# Patient Record
Sex: Female | Born: 1975 | Race: White | Hispanic: No | Marital: Married | Smoking: Former smoker
Health system: Southern US, Community
[De-identification: ages and names within clinical notes are randomized; demographics above are authoritative.]

## PROBLEM LIST (undated history)

## (undated) DIAGNOSIS — R0609 Other forms of dyspnea: Secondary | ICD-10-CM

## (undated) DIAGNOSIS — R079 Chest pain, unspecified: Secondary | ICD-10-CM

## (undated) DIAGNOSIS — Z8619 Personal history of other infectious and parasitic diseases: Secondary | ICD-10-CM

## (undated) DIAGNOSIS — J189 Pneumonia, unspecified organism: Secondary | ICD-10-CM

## (undated) DIAGNOSIS — F329 Major depressive disorder, single episode, unspecified: Secondary | ICD-10-CM

## (undated) DIAGNOSIS — K5909 Other constipation: Secondary | ICD-10-CM

## (undated) DIAGNOSIS — F32A Depression, unspecified: Secondary | ICD-10-CM

## (undated) DIAGNOSIS — IMO0002 Reserved for concepts with insufficient information to code with codable children: Secondary | ICD-10-CM

## (undated) DIAGNOSIS — G43909 Migraine, unspecified, not intractable, without status migrainosus: Secondary | ICD-10-CM

## (undated) DIAGNOSIS — R1310 Dysphagia, unspecified: Secondary | ICD-10-CM

## (undated) DIAGNOSIS — I10 Essential (primary) hypertension: Secondary | ICD-10-CM

## (undated) DIAGNOSIS — N952 Postmenopausal atrophic vaginitis: Secondary | ICD-10-CM

## (undated) DIAGNOSIS — R001 Bradycardia, unspecified: Secondary | ICD-10-CM

## (undated) DIAGNOSIS — K635 Polyp of colon: Secondary | ICD-10-CM

## (undated) DIAGNOSIS — K219 Gastro-esophageal reflux disease without esophagitis: Secondary | ICD-10-CM

## (undated) DIAGNOSIS — I471 Supraventricular tachycardia, unspecified: Secondary | ICD-10-CM

## (undated) DIAGNOSIS — D649 Anemia, unspecified: Secondary | ICD-10-CM

## (undated) DIAGNOSIS — R350 Frequency of micturition: Secondary | ICD-10-CM

## (undated) DIAGNOSIS — E785 Hyperlipidemia, unspecified: Secondary | ICD-10-CM

## (undated) DIAGNOSIS — Z87440 Personal history of urinary (tract) infections: Secondary | ICD-10-CM

## (undated) DIAGNOSIS — D801 Nonfamilial hypogammaglobulinemia: Secondary | ICD-10-CM

## (undated) DIAGNOSIS — Z8489 Family history of other specified conditions: Secondary | ICD-10-CM

## (undated) DIAGNOSIS — F419 Anxiety disorder, unspecified: Secondary | ICD-10-CM

## (undated) DIAGNOSIS — Z87442 Personal history of urinary calculi: Secondary | ICD-10-CM

## (undated) HISTORY — DX: Supraventricular tachycardia, unspecified: I47.10

## (undated) HISTORY — DX: Postmenopausal atrophic vaginitis: N95.2

## (undated) HISTORY — PX: OOPHORECTOMY: SHX86

## (undated) HISTORY — PX: ABDOMINAL HYSTERECTOMY: SHX81

## (undated) HISTORY — DX: Personal history of urinary (tract) infections: Z87.440

## (undated) HISTORY — DX: Chest pain, unspecified: R07.9

## (undated) HISTORY — DX: Polyp of colon: K63.5

## (undated) HISTORY — DX: Gastro-esophageal reflux disease without esophagitis: K21.9

## (undated) HISTORY — DX: Depression, unspecified: F32.A

## (undated) HISTORY — DX: Other forms of dyspnea: R06.09

## (undated) HISTORY — DX: Anxiety disorder, unspecified: F41.9

## (undated) HISTORY — DX: Bradycardia, unspecified: R00.1

## (undated) HISTORY — DX: Personal history of other infectious and parasitic diseases: Z86.19

## (undated) HISTORY — DX: Hyperlipidemia, unspecified: E78.5

## (undated) HISTORY — PX: APPENDECTOMY: SHX54

## (undated) HISTORY — DX: Essential (primary) hypertension: I10

## (undated) HISTORY — DX: Migraine, unspecified, not intractable, without status migrainosus: G43.909

## (undated) HISTORY — DX: Dysphagia, unspecified: R13.10

## (undated) HISTORY — DX: Nonfamilial hypogammaglobulinemia: D80.1

## (undated) HISTORY — DX: Frequency of micturition: R35.0

## (undated) HISTORY — DX: Other constipation: K59.09

## (undated) HISTORY — DX: Reserved for concepts with insufficient information to code with codable children: IMO0002

## (undated) HISTORY — PX: KNEE ARTHROSCOPY: SUR90

## (undated) HISTORY — PX: ENDOMETRIAL ABLATION: SHX621

## (undated) HISTORY — DX: Major depressive disorder, single episode, unspecified: F32.9

## (undated) HISTORY — PX: TONSILLECTOMY AND ADENOIDECTOMY: SUR1326

---

## 2004-08-23 ENCOUNTER — Emergency Department: Payer: Self-pay | Admitting: Emergency Medicine

## 2004-08-24 ENCOUNTER — Ambulatory Visit: Payer: Self-pay | Admitting: Emergency Medicine

## 2004-11-21 ENCOUNTER — Ambulatory Visit: Payer: Self-pay | Admitting: Obstetrics and Gynecology

## 2004-12-26 ENCOUNTER — Ambulatory Visit: Payer: Self-pay | Admitting: Obstetrics and Gynecology

## 2005-01-20 ENCOUNTER — Inpatient Hospital Stay: Payer: Self-pay | Admitting: Obstetrics and Gynecology

## 2005-10-07 ENCOUNTER — Emergency Department: Payer: Self-pay | Admitting: General Practice

## 2005-10-10 ENCOUNTER — Ambulatory Visit: Payer: Self-pay | Admitting: Internal Medicine

## 2005-10-14 ENCOUNTER — Ambulatory Visit: Payer: Self-pay | Admitting: Internal Medicine

## 2006-01-17 ENCOUNTER — Emergency Department: Payer: Self-pay | Admitting: Emergency Medicine

## 2006-05-02 ENCOUNTER — Ambulatory Visit: Payer: Self-pay | Admitting: Obstetrics and Gynecology

## 2006-07-03 ENCOUNTER — Emergency Department: Payer: Self-pay | Admitting: Emergency Medicine

## 2007-11-26 ENCOUNTER — Ambulatory Visit: Payer: Self-pay | Admitting: Family Medicine

## 2008-03-27 ENCOUNTER — Emergency Department: Payer: Self-pay | Admitting: Emergency Medicine

## 2008-03-28 ENCOUNTER — Emergency Department: Payer: Self-pay | Admitting: Emergency Medicine

## 2008-05-30 ENCOUNTER — Ambulatory Visit: Payer: Self-pay | Admitting: Family Medicine

## 2008-05-30 DIAGNOSIS — R51 Headache: Secondary | ICD-10-CM

## 2008-05-30 DIAGNOSIS — R519 Headache, unspecified: Secondary | ICD-10-CM | POA: Insufficient documentation

## 2008-05-30 DIAGNOSIS — J309 Allergic rhinitis, unspecified: Secondary | ICD-10-CM | POA: Insufficient documentation

## 2008-05-30 DIAGNOSIS — F329 Major depressive disorder, single episode, unspecified: Secondary | ICD-10-CM

## 2008-05-30 DIAGNOSIS — K219 Gastro-esophageal reflux disease without esophagitis: Secondary | ICD-10-CM

## 2008-05-30 DIAGNOSIS — F17200 Nicotine dependence, unspecified, uncomplicated: Secondary | ICD-10-CM | POA: Insufficient documentation

## 2008-06-23 ENCOUNTER — Ambulatory Visit: Payer: Self-pay | Admitting: Family Medicine

## 2008-06-23 DIAGNOSIS — J069 Acute upper respiratory infection, unspecified: Secondary | ICD-10-CM | POA: Insufficient documentation

## 2008-07-19 ENCOUNTER — Ambulatory Visit: Payer: Self-pay | Admitting: Obstetrics and Gynecology

## 2008-07-20 ENCOUNTER — Ambulatory Visit: Payer: Self-pay | Admitting: Family Medicine

## 2008-07-22 ENCOUNTER — Ambulatory Visit: Payer: Self-pay | Admitting: Obstetrics and Gynecology

## 2008-08-03 ENCOUNTER — Telehealth (INDEPENDENT_AMBULATORY_CARE_PROVIDER_SITE_OTHER): Payer: Self-pay | Admitting: *Deleted

## 2008-08-17 ENCOUNTER — Ambulatory Visit: Payer: Self-pay | Admitting: Family Medicine

## 2008-08-17 DIAGNOSIS — J019 Acute sinusitis, unspecified: Secondary | ICD-10-CM

## 2008-08-29 ENCOUNTER — Telehealth (INDEPENDENT_AMBULATORY_CARE_PROVIDER_SITE_OTHER): Payer: Self-pay | Admitting: *Deleted

## 2008-10-24 ENCOUNTER — Ambulatory Visit: Payer: Self-pay | Admitting: Family Medicine

## 2008-11-21 ENCOUNTER — Telehealth: Payer: Self-pay | Admitting: Family Medicine

## 2009-03-20 ENCOUNTER — Telehealth: Payer: Self-pay | Admitting: Family Medicine

## 2009-05-08 ENCOUNTER — Ambulatory Visit: Payer: Self-pay | Admitting: Internal Medicine

## 2009-05-08 ENCOUNTER — Telehealth: Payer: Self-pay | Admitting: Family Medicine

## 2009-05-08 ENCOUNTER — Encounter: Payer: Self-pay | Admitting: Cardiology

## 2009-05-08 ENCOUNTER — Emergency Department: Payer: Self-pay | Admitting: Emergency Medicine

## 2009-05-08 ENCOUNTER — Encounter: Payer: Self-pay | Admitting: Gastroenterology

## 2009-05-09 ENCOUNTER — Ambulatory Visit: Payer: Self-pay | Admitting: Gastroenterology

## 2009-05-09 DIAGNOSIS — R109 Unspecified abdominal pain: Secondary | ICD-10-CM | POA: Insufficient documentation

## 2009-05-10 ENCOUNTER — Encounter: Payer: Self-pay | Admitting: Gastroenterology

## 2009-05-10 ENCOUNTER — Ambulatory Visit: Payer: Self-pay | Admitting: Gastroenterology

## 2009-05-11 ENCOUNTER — Telehealth: Payer: Self-pay | Admitting: Gastroenterology

## 2009-05-12 ENCOUNTER — Telehealth (INDEPENDENT_AMBULATORY_CARE_PROVIDER_SITE_OTHER): Payer: Self-pay | Admitting: *Deleted

## 2009-05-15 ENCOUNTER — Ambulatory Visit: Payer: Self-pay | Admitting: Gastroenterology

## 2009-05-15 ENCOUNTER — Telehealth: Payer: Self-pay | Admitting: Gastroenterology

## 2009-05-15 DIAGNOSIS — R079 Chest pain, unspecified: Secondary | ICD-10-CM | POA: Insufficient documentation

## 2009-05-16 ENCOUNTER — Ambulatory Visit: Payer: Self-pay | Admitting: Cardiology

## 2009-05-16 ENCOUNTER — Telehealth: Payer: Self-pay | Admitting: Cardiology

## 2009-05-16 ENCOUNTER — Telehealth: Payer: Self-pay | Admitting: Gastroenterology

## 2009-05-24 ENCOUNTER — Telehealth (INDEPENDENT_AMBULATORY_CARE_PROVIDER_SITE_OTHER): Payer: Self-pay | Admitting: *Deleted

## 2009-05-24 ENCOUNTER — Telehealth: Payer: Self-pay | Admitting: Gastroenterology

## 2009-05-24 ENCOUNTER — Ambulatory Visit (HOSPITAL_COMMUNITY): Admission: RE | Admit: 2009-05-24 | Discharge: 2009-05-24 | Payer: Self-pay | Admitting: Gastroenterology

## 2009-05-25 ENCOUNTER — Ambulatory Visit (HOSPITAL_COMMUNITY): Admission: RE | Admit: 2009-05-25 | Discharge: 2009-05-25 | Payer: Self-pay | Admitting: Cardiovascular Disease

## 2009-05-25 ENCOUNTER — Ambulatory Visit: Payer: Self-pay | Admitting: Cardiovascular Disease

## 2009-05-25 ENCOUNTER — Encounter: Payer: Self-pay | Admitting: Cardiology

## 2009-05-25 ENCOUNTER — Ambulatory Visit: Payer: Self-pay

## 2009-05-25 ENCOUNTER — Encounter (HOSPITAL_COMMUNITY): Admission: RE | Admit: 2009-05-25 | Discharge: 2009-05-25 | Payer: Self-pay | Admitting: Cardiovascular Disease

## 2009-05-25 ENCOUNTER — Telehealth: Payer: Self-pay | Admitting: Cardiology

## 2009-05-28 ENCOUNTER — Emergency Department (HOSPITAL_COMMUNITY): Admission: EM | Admit: 2009-05-28 | Discharge: 2009-05-28 | Payer: Self-pay | Admitting: Emergency Medicine

## 2009-05-30 ENCOUNTER — Telehealth: Payer: Self-pay | Admitting: Cardiology

## 2009-06-01 ENCOUNTER — Telehealth: Payer: Self-pay | Admitting: Cardiology

## 2009-06-05 ENCOUNTER — Ambulatory Visit: Payer: Self-pay | Admitting: Cardiology

## 2009-06-05 DIAGNOSIS — R0602 Shortness of breath: Secondary | ICD-10-CM

## 2009-06-06 ENCOUNTER — Ambulatory Visit: Payer: Self-pay | Admitting: Gastroenterology

## 2009-06-07 ENCOUNTER — Telehealth (INDEPENDENT_AMBULATORY_CARE_PROVIDER_SITE_OTHER): Payer: Self-pay | Admitting: *Deleted

## 2009-06-09 ENCOUNTER — Ambulatory Visit: Payer: Self-pay | Admitting: Cardiovascular Disease

## 2009-06-19 ENCOUNTER — Telehealth (INDEPENDENT_AMBULATORY_CARE_PROVIDER_SITE_OTHER): Payer: Self-pay | Admitting: *Deleted

## 2009-06-28 ENCOUNTER — Encounter: Admission: RE | Admit: 2009-06-28 | Discharge: 2009-06-28 | Payer: Self-pay | Admitting: Surgery

## 2009-07-05 ENCOUNTER — Ambulatory Visit: Payer: Self-pay | Admitting: Unknown Physician Specialty

## 2009-07-12 ENCOUNTER — Ambulatory Visit: Payer: Self-pay | Admitting: Unknown Physician Specialty

## 2009-08-08 DIAGNOSIS — R071 Chest pain on breathing: Secondary | ICD-10-CM | POA: Insufficient documentation

## 2009-08-10 ENCOUNTER — Ambulatory Visit: Payer: Self-pay | Admitting: Family Medicine

## 2009-08-10 DIAGNOSIS — S0990XA Unspecified injury of head, initial encounter: Secondary | ICD-10-CM | POA: Insufficient documentation

## 2009-08-10 DIAGNOSIS — S300XXA Contusion of lower back and pelvis, initial encounter: Secondary | ICD-10-CM

## 2009-08-17 ENCOUNTER — Telehealth: Payer: Self-pay | Admitting: Family Medicine

## 2009-09-19 ENCOUNTER — Telehealth: Payer: Self-pay | Admitting: Family Medicine

## 2009-10-16 ENCOUNTER — Telehealth: Payer: Self-pay | Admitting: Gastroenterology

## 2009-10-17 ENCOUNTER — Encounter: Payer: Self-pay | Admitting: Gastroenterology

## 2009-11-03 ENCOUNTER — Telehealth: Payer: Self-pay | Admitting: Family Medicine

## 2010-09-02 ENCOUNTER — Encounter: Payer: Self-pay | Admitting: Gastroenterology

## 2010-09-13 NOTE — Progress Notes (Signed)
Summary: heart fluttering  Phone Note Call from Patient Call back at [phone]   Caller: Patient Call For: Dr. Patsy Lager Summary of Call: Patient says that she is going to emergency room. Her heart has been fluttering since yesterday, and today has had a severe headache and she feels really wierd and she says that she feels a little clammy.  Initial call taken by: Melody Comas,  November 03, 2009 10:04 AM  Follow-up for Phone Call        I think going to ER is reasonable Follow-up by: Hannah Beat MD,  November 04, 2009 7:50 AM

## 2010-09-13 NOTE — Progress Notes (Signed)
Summary: Nexium  Phone Note Call from Patient Call back at [phone]   Caller: Patient Call For: Dr. Christella Hartigan Reason for Call: Talk to Nurse Summary of Call: pt would like an rx for Nexium... [facility] [address] in [place] [phone] Initial call taken by: Vallarie Mare,  October 16, 2009 1:18 PM    Prescriptions: NEXIUM 40 MG CPDR (ESOMEPRAZOLE MAGNESIUM) Take one tablet by mouth once daily.  #30 x 11   Entered by:   Chales Abrahams CMA (AAMA)   Authorized by:   Rachael Fee MD   Signed by:   Chales Abrahams CMA (AAMA) on 10/16/2009   Method used:   Electronically to        [facility]. [phone]* (retail)       [address]       [place], [place]  [address]       Ph: [phone]       Fax: [phone]   RxID:   [id number]

## 2010-09-13 NOTE — Progress Notes (Signed)
Summary: URI  Phone Note Call from Patient Call back at [phone]   Caller: Patient Summary of Call: Pt thinks she has the flu- has bad HA, sore throat, temp around 99, achy.  Sxs started last thursday but got worse last night.  Advised rest, fluids, tylenol. Please advise. Initial call taken by: Lowella Petties CMA,  September 19, 2009 10:01 AM  Follow-up for Phone Call        Over 1 week ago..tamiflu not effective past 72 hours. Encourgae rest, fluids, tylenol/advil as needed body ache, pain and fever. Caln call in cough suppressant for sleep if needed.  Expect improvement gradual in 7-10 day course.  If SOB make appt to be seen.  Follow-up by: Kerby Nora MD,  September 19, 2009 2:14 PM  Additional Follow-up for Phone Call Additional follow up Details #1::        Patient advised as instructed, she would like cough medicine called into [facility] on [address].  Please advise. Additional Follow-up by: Linde Gillis CMA Duncan Dull),  September 19, 2009 3:32 PM    Additional Follow-up for Phone Call Additional follow up Details #2::    Rx called to pharmacy Follow-up by: Linde Gillis CMA Duncan Dull),  September 19, 2009 6:03 PM  New/Updated Medications: CHERATUSSIN AC 100-10 MG/5ML SYRP (GUAIFENESIN-CODEINE) 1 to 2 tsp by mouth at bedtime as needed cough Prescriptions: CHERATUSSIN AC 100-10 MG/5ML SYRP (GUAIFENESIN-CODEINE) 1 to 2 tsp by mouth at bedtime as needed cough  #8 oz x 0   Entered and Authorized by:   Kerby Nora MD   Signed by:   Kerby Nora MD on 09/19/2009   Method used:   Telephoned to ...       [facility]. [phone]* (retail)       [address] [place], [place]  [address]       Ph: [phone]       Fax: [phone]   RxID:   [id number]

## 2010-09-13 NOTE — Medication Information (Signed)
Summary: Denied/[employer]  Denied/[employer]   Imported By: Lester  10/24/2009 08:41:18  _____________________________________________________________________  External Attachment:    Type:   Image     Comment:   External Document

## 2010-09-13 NOTE — Progress Notes (Signed)
Summary: Rx Midrin  Phone Note Refill Request Message from:  [facility] on August 17, 2009 3:33 PM  Refills Requested: Medication #1:  MIDRIN 325-65-100 MG CAPS 1 by mouth q hour as needed severe headache Received faxed refill request which must be completed by physician, form in your IN box.     Method Requested: Fax to Local Pharmacy Initial call taken by: Linde Gillis CMA Duncan Dull),  August 17, 2009 3:36 PM  Follow-up for Phone Call        Form completed.  Follow-up by: Kerby Nora MD,  August 17, 2009 3:40 PM

## 2010-11-15 LAB — CBC
HCT: 40.1 % (ref 36.0–46.0)
Platelets: 183 10*3/uL (ref 150–400)
RDW: 13.2 % (ref 11.5–15.5)
WBC: 5.7 10*3/uL (ref 4.0–10.5)

## 2010-11-15 LAB — BASIC METABOLIC PANEL
BUN: 7 mg/dL (ref 6–23)
Calcium: 8.8 mg/dL (ref 8.4–10.5)
Creatinine, Ser: 0.64 mg/dL (ref 0.4–1.2)
GFR calc non Af Amer: >60 mL/min (ref >60)
Glucose, Bld: 83 mg/dL (ref 70–99)

## 2010-11-15 LAB — RAPID URINE DRUG SCREEN, HOSP PERFORMED
Amphetamines: NOT DETECTED
Benzodiazepines: NOT DETECTED
Cocaine: NOT DETECTED
Opiates: NOT DETECTED
Tetrahydrocannabinol: NOT DETECTED

## 2010-11-15 LAB — DIFFERENTIAL
Basophils Absolute: 0.1 10*3/uL (ref 0.0–0.1)
Eosinophils Relative: 4 % (ref 0–5)
Lymphocytes Relative: 31 % (ref 12–46)
Lymphs Abs: 1.8 10*3/uL (ref 0.7–4.0)
Neutro Abs: 3.2 10*3/uL (ref 1.7–7.7)
Neutrophils Relative %: 57 % (ref 43–77)

## 2010-11-15 LAB — POCT CARDIAC MARKERS: CKMB, poc: <1 ng/mL — ABNORMAL LOW (ref 1.0–8.0)

## 2010-11-15 LAB — D-DIMER, QUANTITATIVE: D-Dimer, Quant: <0.22 ug/mL-FEU (ref 0.00–0.48)

## 2010-11-15 LAB — URINALYSIS, ROUTINE W REFLEX MICROSCOPIC
Bilirubin Urine: NEGATIVE
Hgb urine dipstick: NEGATIVE
Ketones, ur: NEGATIVE mg/dL
Specific Gravity, Urine: 1.01 (ref 1.005–1.030)
pH: 7.5 (ref 5.0–8.0)

## 2010-11-15 LAB — POCT PREGNANCY, URINE: Preg Test, Ur: NEGATIVE

## 2011-03-02 ENCOUNTER — Emergency Department: Payer: Self-pay | Admitting: Emergency Medicine

## 2011-10-26 ENCOUNTER — Emergency Department: Payer: Self-pay | Admitting: Emergency Medicine

## 2011-11-12 ENCOUNTER — Ambulatory Visit: Payer: Self-pay | Admitting: Family Medicine

## 2011-12-30 ENCOUNTER — Emergency Department: Payer: Self-pay | Admitting: Emergency Medicine

## 2013-05-15 ENCOUNTER — Emergency Department: Payer: Self-pay | Admitting: Emergency Medicine

## 2013-05-15 LAB — COMPREHENSIVE METABOLIC PANEL
Alkaline Phosphatase: 61 U/L (ref 50–136)
Anion Gap: 6 — ABNORMAL LOW (ref 7–16)
Chloride: 107 mmol/L (ref 98–107)
EGFR (African American): >60
Glucose: 79 mg/dL (ref 65–99)
SGPT (ALT): 26 U/L (ref 12–78)

## 2013-05-15 LAB — CBC
HGB: 14.1 g/dL (ref 12.0–16.0)
MCH: 32.4 pg (ref 26.0–34.0)
MCHC: 35.4 g/dL (ref 32.0–36.0)
MCV: 91 fL (ref 80–100)
Platelet: 205 10*3/uL (ref 150–440)

## 2013-05-15 LAB — URINALYSIS, COMPLETE
Blood: NEGATIVE
Nitrite: NEGATIVE
Ph: 6 (ref 4.5–8.0)
RBC,UR: NONE SEEN /HPF (ref 0–5)
Specific Gravity: 1.006 (ref 1.003–1.030)

## 2013-06-09 ENCOUNTER — Ambulatory Visit: Payer: Self-pay | Admitting: Obstetrics and Gynecology

## 2013-06-09 LAB — CBC
HCT: 37.6 % (ref 35.0–47.0)
HGB: 13.2 g/dL (ref 12.0–16.0)
MCHC: 35 g/dL (ref 32.0–36.0)
WBC: 7.9 10*3/uL (ref 3.6–11.0)

## 2013-06-09 LAB — BASIC METABOLIC PANEL
BUN: 8 mg/dL (ref 7–18)
Co2: 26 mmol/L (ref 21–32)
Creatinine: 0.73 mg/dL (ref 0.60–1.30)
EGFR (African American): >60
EGFR (Non-African Amer.): >60
Potassium: 3.8 mmol/L (ref 3.5–5.1)

## 2013-06-14 ENCOUNTER — Ambulatory Visit: Payer: Self-pay | Admitting: Obstetrics and Gynecology

## 2013-06-15 LAB — HEMOGLOBIN: HGB: 11.9 g/dL — ABNORMAL LOW (ref 12.0–16.0)

## 2013-08-12 HISTORY — PX: ABDOMINAL HYSTERECTOMY: SUR658

## 2013-09-01 ENCOUNTER — Emergency Department: Payer: Self-pay | Admitting: Emergency Medicine

## 2013-09-01 LAB — URINALYSIS, COMPLETE
BILIRUBIN, UR: NEGATIVE
Glucose,UR: NEGATIVE mg/dL (ref 0–75)
Ketone: NEGATIVE
NITRITE: NEGATIVE
PH: 7 (ref 4.5–8.0)
Protein: 30
RBC,UR: 210 /HPF (ref 0–5)
SPECIFIC GRAVITY: 1.012 (ref 1.003–1.030)
Squamous Epithelial: 6
WBC UR: 336 /HPF (ref 0–5)

## 2013-09-29 ENCOUNTER — Emergency Department: Payer: Self-pay | Admitting: Emergency Medicine

## 2013-10-28 ENCOUNTER — Encounter: Payer: Self-pay | Admitting: Neurology

## 2013-10-28 ENCOUNTER — Encounter (INDEPENDENT_AMBULATORY_CARE_PROVIDER_SITE_OTHER): Payer: Self-pay

## 2013-10-28 ENCOUNTER — Ambulatory Visit (INDEPENDENT_AMBULATORY_CARE_PROVIDER_SITE_OTHER): Payer: BC Managed Care – HMO | Admitting: Neurology

## 2013-10-28 VITALS — BP 143/94 | HR 87 | Ht 64.0 in | Wt 193.0 lb

## 2013-10-28 DIAGNOSIS — R51 Headache: Secondary | ICD-10-CM

## 2013-10-28 DIAGNOSIS — R569 Unspecified convulsions: Secondary | ICD-10-CM

## 2013-10-28 DIAGNOSIS — J019 Acute sinusitis, unspecified: Secondary | ICD-10-CM

## 2013-10-28 MED ORDER — METHYLPREDNISOLONE (PAK) 4 MG PO TABS: ORAL_TABLET | ORAL | Status: DC
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
## 2013-10-28 NOTE — Patient Instructions (Signed)
Overall you are doing fairly well but I do want to suggest a few things today:   As far as your medications are concerned, I would like to suggest trying you on a steroid taper. This was sent to your pharmacy.   As far as diagnostic testing:  1)Please schedule an EEG when you check out  Follow up as needed. Please call us with any interim questions, concerns, problems, updates or refill requests.   Please also call us for any test results so we can go over those with you on the phone.  My clinical assistant and will answer any of your questions and relay your messages to me and also relay most of my messages to you.   Our phone number is [phone]. We also have an after hours call service for urgent matters and there is a physician on-call for urgent questions. For any emergencies you know to call 911 or go to the nearest emergency room

## 2013-11-01 ENCOUNTER — Telehealth: Payer: Self-pay | Admitting: Neurology

## 2013-11-01 NOTE — Telephone Encounter (Signed)
Please let her know she does not need to be sleep deprived. Thanks.

## 2013-11-01 NOTE — Telephone Encounter (Signed)
Dr. Hosie PoissonSumner is this a sleep deprived EEG?

## 2013-11-01 NOTE — Telephone Encounter (Signed)
Pt is having an EEG done on Thursday and wants to know does pt have to be sleep deprived? Please call pt to confirm this information. Thanks

## 2013-11-02 NOTE — Telephone Encounter (Signed)
Spoke to patient and relayed that she didn't need to be sleep deprived for EEG, per Dr. Hosie PoissonSumner.

## 2013-11-04 ENCOUNTER — Other Ambulatory Visit: Payer: BC Managed Care – HMO | Admitting: Radiology

## 2013-11-05 ENCOUNTER — Telehealth: Payer: Self-pay | Admitting: Neurology

## 2013-11-05 ENCOUNTER — Ambulatory Visit (INDEPENDENT_AMBULATORY_CARE_PROVIDER_SITE_OTHER): Payer: BC Managed Care – HMO | Admitting: Radiology

## 2013-11-05 ENCOUNTER — Other Ambulatory Visit: Payer: Self-pay | Admitting: Neurology

## 2013-11-05 DIAGNOSIS — J019 Acute sinusitis, unspecified: Secondary | ICD-10-CM

## 2013-11-05 DIAGNOSIS — R569 Unspecified convulsions: Secondary | ICD-10-CM

## 2013-11-05 DIAGNOSIS — R51 Headache: Secondary | ICD-10-CM

## 2013-11-05 MED ORDER — GABAPENTIN 100 MG PO CAPS: 100.0000 mg | ORAL_CAPSULE | Freq: Three times a day (TID) | ORAL | Status: DC
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
## 2013-11-05 NOTE — Telephone Encounter (Signed)
Patient is continuing to have headaches--please call.

## 2013-11-05 NOTE — Telephone Encounter (Signed)
Started neurontin 100mg  TID

## 2013-12-09 ENCOUNTER — Emergency Department: Payer: Self-pay | Admitting: Emergency Medicine

## 2014-02-13 ENCOUNTER — Emergency Department: Payer: Self-pay | Admitting: Emergency Medicine

## 2014-02-25 LAB — COMPREHENSIVE METABOLIC PANEL
ALK PHOS: 61 U/L
ANION GAP: 4 — AB (ref 7–16)
Albumin: 4 g/dL (ref 3.4–5.0)
BUN: 7 mg/dL (ref 7–18)
Bilirubin,Total: 0.6 mg/dL (ref 0.2–1.0)
CALCIUM: 8.8 mg/dL (ref 8.5–10.1)
CREATININE: 0.82 mg/dL (ref 0.60–1.30)
Chloride: 111 mmol/L — ABNORMAL HIGH (ref 98–107)
Co2: 25 mmol/L (ref 21–32)
EGFR (African American): >60
GLUCOSE: 94 mg/dL (ref 65–99)
Osmolality: 277 (ref 275–301)
POTASSIUM: 3.7 mmol/L (ref 3.5–5.1)
SGOT(AST): 16 U/L (ref 15–37)
SGPT (ALT): 21 U/L (ref 12–78)
SODIUM: 140 mmol/L (ref 136–145)
Total Protein: 7.4 g/dL (ref 6.4–8.2)

## 2014-02-25 LAB — URINALYSIS, COMPLETE
BILIRUBIN, UR: NEGATIVE
BLOOD: NEGATIVE
GLUCOSE, UR: NEGATIVE mg/dL (ref 0–75)
KETONE: NEGATIVE
Leukocyte Esterase: NEGATIVE
Nitrite: NEGATIVE
Ph: 7 (ref 4.5–8.0)
Protein: NEGATIVE
RBC,UR: 2 /HPF (ref 0–5)
SPECIFIC GRAVITY: 1.013 (ref 1.003–1.030)

## 2014-02-25 LAB — CBC WITH DIFFERENTIAL/PLATELET
BASOS ABS: 0 10*3/uL (ref 0.0–0.1)
Basophil %: 0.4 %
EOS ABS: 0 10*3/uL (ref 0.0–0.7)
EOS PCT: 0 %
HCT: 39 % (ref 35.0–47.0)
HGB: 13.5 g/dL (ref 12.0–16.0)
Lymphocyte #: 2.4 10*3/uL (ref 1.0–3.6)
Lymphocyte %: 22.8 %
MCH: 32.1 pg (ref 26.0–34.0)
MCHC: 34.6 g/dL (ref 32.0–36.0)
MCV: 93 fL (ref 80–100)
Monocyte #: 0.8 x10 3/mm (ref 0.2–0.9)
Monocyte %: 7.6 %
NEUTROS ABS: 7.2 10*3/uL — AB (ref 1.4–6.5)
Neutrophil %: 69.2 %
PLATELETS: 176 10*3/uL (ref 150–440)
RBC: 4.2 10*6/uL (ref 3.80–5.20)
RDW: 12.8 % (ref 11.5–14.5)
WBC: 10.3 10*3/uL (ref 3.6–11.0)

## 2014-02-26 ENCOUNTER — Observation Stay: Payer: Self-pay | Admitting: Surgery

## 2014-03-01 LAB — PATHOLOGY REPORT

## 2014-03-28 ENCOUNTER — Ambulatory Visit: Payer: Self-pay | Admitting: Surgery

## 2014-07-22 ENCOUNTER — Emergency Department: Payer: Self-pay | Admitting: Emergency Medicine

## 2014-07-22 LAB — BASIC METABOLIC PANEL
Anion Gap: 8 (ref 7–16)
BUN: 10 mg/dL (ref 7–18)
Calcium, Total: 8.3 mg/dL — ABNORMAL LOW (ref 8.5–10.1)
Chloride: 111 mmol/L — ABNORMAL HIGH (ref 98–107)
Co2: 24 mmol/L (ref 21–32)
Creatinine: 0.75 mg/dL (ref 0.60–1.30)
EGFR (African American): >60
EGFR (Non-African Amer.): >60
GLUCOSE: 134 mg/dL — AB (ref 65–99)
Osmolality: 286 (ref 275–301)
POTASSIUM: 3.5 mmol/L (ref 3.5–5.1)
Sodium: 143 mmol/L (ref 136–145)

## 2014-07-22 LAB — CBC
HCT: 35.5 % (ref 35.0–47.0)
HGB: 12.3 g/dL (ref 12.0–16.0)
MCH: 32 pg (ref 26.0–34.0)
MCHC: 34.6 g/dL (ref 32.0–36.0)
MCV: 93 fL (ref 80–100)
Platelet: 162 10*3/uL (ref 150–440)
RBC: 3.84 10*6/uL (ref 3.80–5.20)
RDW: 12.9 % (ref 11.5–14.5)
WBC: 7 10*3/uL (ref 3.6–11.0)

## 2014-07-22 LAB — HCG, QUANTITATIVE, PREGNANCY

## 2014-07-22 LAB — TROPONIN I

## 2014-08-31 ENCOUNTER — Emergency Department: Payer: Self-pay | Admitting: Emergency Medicine

## 2014-09-01 ENCOUNTER — Ambulatory Visit: Payer: Self-pay | Admitting: Family Medicine

## 2014-09-12 DIAGNOSIS — K635 Polyp of colon: Secondary | ICD-10-CM

## 2014-09-12 HISTORY — PX: ESOPHAGOGASTRODUODENOSCOPY: SHX1529

## 2014-09-12 HISTORY — DX: Polyp of colon: K63.5

## 2014-09-12 HISTORY — PX: COLONOSCOPY: SHX174

## 2014-09-20 DIAGNOSIS — K2100 Gastro-esophageal reflux disease with esophagitis, without bleeding: Secondary | ICD-10-CM | POA: Insufficient documentation

## 2014-09-20 DIAGNOSIS — R1310 Dysphagia, unspecified: Secondary | ICD-10-CM | POA: Insufficient documentation

## 2014-09-20 DIAGNOSIS — K5909 Other constipation: Secondary | ICD-10-CM | POA: Insufficient documentation

## 2014-10-03 ENCOUNTER — Ambulatory Visit: Payer: Self-pay | Admitting: Unknown Physician Specialty

## 2014-10-14 ENCOUNTER — Ambulatory Visit: Payer: Self-pay | Admitting: Neurology

## 2014-10-24 ENCOUNTER — Ambulatory Visit
Admit: 2014-10-24 | Disposition: A | Discharge status: Home / Self Care | Payer: Self-pay | Attending: Internal Medicine | Admitting: Internal Medicine

## 2014-11-07 LAB — URINALYSIS, COMPLETE
Bilirubin,UR: NEGATIVE
Blood: NEGATIVE
Glucose,UR: NEGATIVE mg/dL (ref 0–75)
KETONE: NEGATIVE
Nitrite: NEGATIVE
Ph: 6 (ref 4.5–8.0)
Protein: NEGATIVE
RBC,UR: 8 /HPF (ref 0–5)
SPECIFIC GRAVITY: 1.016 (ref 1.003–1.030)
Squamous Epithelial: 2

## 2014-11-09 LAB — URINE CULTURE

## 2014-11-11 ENCOUNTER — Ambulatory Visit
Admit: 2014-11-11 | Disposition: A | Discharge status: Home / Self Care | Payer: Self-pay | Attending: Internal Medicine | Admitting: Internal Medicine

## 2014-11-16 DIAGNOSIS — E662 Morbid (severe) obesity with alveolar hypoventilation: Secondary | ICD-10-CM | POA: Insufficient documentation

## 2014-11-16 DIAGNOSIS — M5481 Occipital neuralgia: Secondary | ICD-10-CM | POA: Insufficient documentation

## 2014-12-02 NOTE — Op Note (Signed)
PATIENT NAME:  Amber Campos, Amber Campos MR#:  [id number] DATE OF BIRTH:  01-12-1976  DATE OF PROCEDURE:  06/14/2013  PREOPERATIVE DIAGNOSES: 1.  Chronic pelvic pain.  2.  Endometriosis.  3.  Status post laparoscopic supracervical hysterectomy.   POSTOPERATIVE DIAGNOSES: 1.  Chronic pelvic pain.  2.  Endometriosis.  3.  Status post laparoscopic supracervical hysterectomy.  4.  Pelvic adhesive disease.   OPERATIVE PROCEDURES:  1.  Laparoscopic bilateral salpingo-oophorectomy and adhesiolysis.  2.  Vaginal trachelectomy.   SURGEON: Prentice DockerMartin A. DeFrancesco, Campos.D.   FIRST ASSISTANT: Dr. Valentino Saxonherry and Ardeen JourdainSeth Abel, PA-S.   ANESTHESIA: General endotracheal.   INDICATIONS: The patient is a 39 year old female with a diagnosis of endometriosis, status post LSH in the past, who presents for definitive surgical management of chronic pelvic pain, worsening. The patient preoperatively was noted to have a right hydrosalpinx. Because of her worsening right lower quadrant pain, she desires definitive treatment through BSO and trachelectomy.  Marland Kitchen.   FINDINGS AT SURGERY: Revealed bilateral ovaries which were grossly normal. There was a 2 x 3 cm right hydrosalpinx present. There were omental/bowel/vaginal cuff adhesions which were taken down with the Harmonic scalpel. There was no obvious evidence of endometriosis throughout the pelvis.   DESCRIPTION OF PROCEDURE: The patient was brought to the Operating Room where she was in the supine position. General endotracheal anesthesia was induced without difficulty. She was placed in the dorsal lithotomy position using the bumblebee stirrups. A ChloraPrep and Betadine abdominal, perineal and intravaginal prep and drape was performed in the standard fashion. A red Robinson catheter was used to drain 100 mL of urine from the bladder. A sponge stick was placed in the vagina. A 5 mm trocar using the Optiview laparoscopic trocar system was used to place the 5 mm port in the subumbilical  incision under direct visualization. No bowel or vascular injury was encountered. Two other 5 mm ports were placed in the right and left lower quadrants directly under close observation. The findings were photo documented.   The adhesions between the cuff and the omentum were taken down with the Harmonic scalpel. The infundibulopelvic ligaments were isolated and then clamped, coagulated and cut using the Ace Harmonic scalpel. In addition, the remainder of the mesosalpinx and attachments were also taken down with a clamping, cutting and coagulating technique with the Harmonic scalpel. Both adnexal structures were placed into the cul-de-sac for removal/retrieval. After verifying adequate hemostasis and normal peristalsis of the ureters, the decision was made to finish the vaginal trachelectomy.   The patient was placed in the high lithotomy position. A weighted speculum was placed into the vagina and a double-tooth tenaculum was placed onto the cervix. Posterior colpotomy was made with Mayo scissors. The uterosacral ligaments were clamped, cut and stick tied using 0 Vicryl suture. These were tagged. The remainder of the cardinal-broad ligament complexes were then clamped, cut and stick tied using 0 Vicryl suture. This was followed by clamping across the top of the cervical stump and excising it from the operative field. Again, 0 Vicryl suture was used for hemostasis. The posterior cuff was then run using a 0 Vicryl suture in a baseball stitch manner. This was followed by closure of the vaginal cuff using 2-0 chromic simple interrupted sutures.   Following completion of the vaginal part of the procedure, repeat laparoscopy verified adequate hemostasis within the pelvis. Pneumoperitoneum was released. The incisions were closed with 4-0 Vicryl suture in a subcuticular manner. Dermabond was placed over the incision sites. The  patient was then awakened, extubated and taken to the recovery room in satisfactory  condition.   ESTIMATED BLOOD LOSS: 100 mL.   IV FLUIDS: Were 900 mL.   URINE OUTPUT: Was 100 mL.   All instruments, needle, and sponge counts were verified as correct. The patient did receive Ancef antibiotic prophylaxis, 2 grams.   ____________________________ Prentice Docker DeFrancesco, MD mad:cs D: 06/14/2013 17:39:20 ET T: 06/14/2013 19:51:43 ET JOB#: [id number]  cc: Daphine Deutscher A. DeFrancesco, MD, <Dictator> [facility] Prentice Docker DEFRANCESCO MD ELECTRONICALLY SIGNED 06/19/2013 20:02

## 2014-12-03 NOTE — H&P (Signed)
History of Present Illness 37 yowf with 28 hours of abdominal pain: began generalized, now in RLQ and radiates to her umbilicus. Associated with nausea and anorexia, no vomiting or fever.   Past Med/Surgical Hx:  Panic Attacks:   Hypercholesterolemia:   HTN:   Depression:   Migraines:   Knee Surgery - Right:   Hysterectomy - Total:   Novasure Ablation X 2:   Tonsillectomy and Adenoidectomy:   C-Section:   Tubal Ligation:   ALLERGIES:  Percocet: N/V  All meats: N/V/Diarrhea  HOME MEDICATIONS: Medication Instructions Status  lisinopril 10 mg oral tablet 1 tab(s) orally once a day (in the morning) Active  clonazePAM 1 mg oral tablet 1 tab(s) orally 2 times a day Active  Metoprolol Tartrate 25 mg oral tablet 0.5 tab(s) orally 2 times a day Active  omeprazole 20 mg oral delayed release capsule 1 cap(s) orally once a day (in the morning) Active   Family and Social History:  Family History Non-Contributory   Social History positive  tobacco (Current within 1 year), positive ETOH, negative Illicit drugs, married, 3 kids, homemaker; smokes < 1 ppd, drinks rarely   + Tobacco Current (within 1 year)   Place of Living Home   Review of Systems:  Fever/Chills No   Cough No   Sputum No   Abdominal Pain Yes   Diarrhea No   Constipation No   Nausea/Vomiting Yes   SOB/DOE No   Chest Pain No   Dysuria No   Tolerating PT Yes   Tolerating Diet Yes  Nauseated   Medications/Allergies Reviewed Medications/Allergies reviewed   Physical Exam:  GEN well developed, well nourished, no acute distress   HEENT pink conjunctivae, PERRL, hearing intact to voice, moist oral mucosa, Oropharynx clear   NECK supple  trachea midline   RESP normal resp effort  clear BS  no use of accessory muscles   CARD regular rate  no murmur  No LE edema  no JVD  no Rub   ABD positive tenderness  soft, flat, nondistended, tenderness with rebound but no guarding, particularly in RLQ   LYMPH  negative neck   EXTR negative cyanosis/clubbing, negative edema   SKIN normal to palpation, No rashes, No ulcers, skin turgor poor   NEURO cranial nerves intact, negative tremor, follows commands, motor/sensory function intact   PSYCH alert, A+O to time, place, person, good insight   Lab Results: Hepatic:  17-Jul-15 17:30   Bilirubin, Total 0.6  Alkaline Phosphatase 61 (45-117 NOTE: New Reference Range 07/02/13)  SGPT (ALT) 21  SGOT (AST) 16  Total Protein, Serum 7.4  Albumin, Serum 4.0  Routine Chem:  17-Jul-15 17:30   Glucose, Serum 94  BUN 7  Creatinine (comp) 0.82  Sodium, Serum 140  Potassium, Serum 3.7  Chloride, Serum  111  CO2, Serum 25  Calcium (Total), Serum 8.8  Osmolality (calc) 277  eGFR (African American) >60  eGFR (Non-African American) >60 (eGFR values <59m/min/1.73 m2 may be an indication of chronic kidney disease (CKD). Calculated eGFR is useful in patients with stable renal function. The eGFR calculation will not be reliable in acutely ill patients when serum creatinine is changing rapidly. It is not useful in  patients on dialysis. The eGFR calculation may not be applicable to patients at the low and high extremes of body sizes, pregnant women, and vegetarians.)  Anion Gap  4  Routine UA:  17-Jul-15 17:29   Color (UA) Yellow  Clarity (UA) Hazy  Glucose (UA) Negative  Bilirubin (UA) Negative  Ketones (UA) Negative  Specific Gravity (UA) 1.013  Blood (UA) Negative  pH (UA) 7.0  Protein (UA) Negative  Nitrite (UA) Negative  Leukocyte Esterase (UA) Negative (Result(s) reported on 25 Feb 2014 at 05:50PM.)  RBC (UA) 2 /HPF  WBC (UA) 2 /HPF  Bacteria (UA) 1+  Epithelial Cells (UA) 3 /HPF (Result(s) reported on 25 Feb 2014 at 05:50PM.)  Routine Hem:  17-Jul-15 17:30   WBC (CBC) 10.3  RBC (CBC) 4.20  Hemoglobin (CBC) 13.5  Hematocrit (CBC) 39.0  Platelet Count (CBC) 176  MCV 93  MCH 32.1  MCHC 34.6  RDW 12.8  Neutrophil % 69.2   Lymphocyte % 22.8  Monocyte % 7.6  Eosinophil % 0.0  Basophil % 0.4  Neutrophil #  7.2  Lymphocyte # 2.4  Monocyte # 0.8  Eosinophil # 0.0  Basophil # 0.0 (Result(s) reported on 25 Feb 2014 at 05:50PM.)   Radiology Results: LabUnknown:    17-Jul-15 20:40, CT Abdomen and Pelvis With Contrast  PACS Image  CT:  CT Abdomen and Pelvis With Contrast  REASON FOR EXAM:    (1) rlq pain; (2) rlq pain  COMMENTS:       PROCEDURE: CT  - CT ABDOMEN / PELVIS  W  - Feb 25 2014  8:40PM     CLINICAL DATA:  Abdomen right lower quadrant pain.    EXAM:  CT ABDOMEN AND PELVIS WITH CONTRAST    TECHNIQUE:  Multidetector CT imaging of the abdomen and pelvis was performed  using the standard protocol following bolus administration of  intravenous contrast.  CONTRAST:  100 cc Isovue-300    COMPARISON:  05/16/2013    FINDINGS:  Lung bases are clear.  No pleural orpericardial fluid.    The liver has normal appearance without focal lesions or biliary  ductal dilatation. No calcified gallstones. The spleen is normal.  The pancreas is normal. The adrenal glands are normal. The kidneys  are normal. The aorta and IVC are normal. No retroperitoneal mass or  adenopathy. No free intraperitoneal fluid or air.    Patient has acute appendicitis. There appears to be an appendicolith  at the mouth of the appendix is well as a few more distally. There  is only mild surrounding inflammatory change. No rupture or abscess  at this time.    Bladder is normal. Previous hysterectomy. No pelvic mass. No bony  abnormality.     IMPRESSION:  Acute unruptured appendicitis.    These results were called by telephone at the time of interpretation  on 02/25/2014 at 8:46 pm to Dr. Jimmye Norman , who verbally acknowledged  these results.      Electronically Signed    By: Nelson Chimes M.D.    On: 02/25/2014 20:48         Verified By: Jules Schick, M.D.,    Assessment/Admission Diagnosis acute appendicitis    Plan Lap appy. Discussed details and patient agrees.   Electronic Signatures: Consuela Mimes (MD)  (Signed [phone] 21:24)  Authored: CHIEF COMPLAINT and HISTORY, PAST MEDICAL/SURGIAL HISTORY, ALLERGIES, HOME MEDICATIONS, FAMILY AND SOCIAL HISTORY, REVIEW OF SYSTEMS, PHYSICAL EXAM, LABS, Radiology, ASSESSMENT AND PLAN   Last Updated: 17-Jul-15 21:24 by Consuela Mimes (MD)

## 2014-12-03 NOTE — Op Note (Signed)
PATIENT NAME:  Amber Campos, Amber Campos MR#:  [id number] DATE OF BIRTH:  [birth year]  DATE OF PROCEDURE:  02/25/2014  OPERATION PERFORMED: Laparoscopic appendectomy.   PREOPERATIVE DIAGNOSIS: Acute appendicitis.   POSTOPERATIVE DIAGNOSIS: Acute appendicitis, early.   ANESTHESIA: General.   SURGEON: Claude MangesWilliam F. Jaycub Noorani, Campos.D.   PROCEDURE IN DETAIL: The patient was placed supine on the Operating Room table and prepped and draped in the usual sterile fashion. A Hasson cannula was introduced amidst horizontal mattress sutures and the linea alba via a 1 inch supraumbilical midline incision, and a 40 mmHg CO2 pneumoperitoneum was created. Two additional 5 mm trocars were placed. The patient had large fatty omentum, which was pulled off of the cecum, and the appendix was seen there. It was injected and edematous, but there was no surrounding pus or exudate. The mesoappendix was taken down with the Harmonic scalpel, the appendectomy was performed with an Endo GIA stapling device, the appendix was placed in an Endo Catch bag, and it was extracted from the abdomen via the epigastric port. The right lower quadrant was irrigated with warm normal saline. This was suctioned clear from the right lower quadrant, the right gutter, the subhepatic space and the pelvis. The omentum was then drawn back over top of the appendiceal stump and the peritoneum was desufflated and decannulated. The linea alba was closed with a single simple interrupted 0 PDS and the previously placed 0 Vicryls, which were tied one to another, and all three skin sites were closed with subcuticular 5-0 Monocryl and suture strips. The patient tolerated the procedure well. There were no complications.     ____________________________ Claude MangesWilliam F. Corinne Goucher, MD wfm:cg D: 02/25/2014 23:03:34 ET T: 02/26/2014 06:45:56 ET JOB#: [id number]  cc: Claude MangesWilliam F. Sevyn Markham, MD, <Dictator> Claude MangesWILLIAM F Josette Shimabukuro MD ELECTRONICALLY SIGNED 02/26/2014 19:37

## 2014-12-05 LAB — SURGICAL PATHOLOGY

## 2015-01-26 ENCOUNTER — Other Ambulatory Visit: Payer: Self-pay | Admitting: Oncology

## 2015-01-27 ENCOUNTER — Other Ambulatory Visit: Payer: Self-pay | Admitting: *Deleted

## 2015-01-27 ENCOUNTER — Encounter: Payer: Self-pay | Admitting: *Deleted

## 2015-01-30 ENCOUNTER — Other Ambulatory Visit: Payer: Self-pay | Admitting: *Deleted

## 2015-01-31 ENCOUNTER — Ambulatory Visit: Payer: Self-pay | Admitting: Urology

## 2015-01-31 ENCOUNTER — Encounter: Payer: Self-pay | Admitting: Urology

## 2015-02-06 ENCOUNTER — Emergency Department
Admission: EM | Admit: 2015-02-06 | Discharge: 2015-02-06 | Disposition: A | Discharge status: Home / Self Care | Payer: Self-pay | Attending: Emergency Medicine | Admitting: Emergency Medicine

## 2015-02-06 ENCOUNTER — Encounter: Payer: Self-pay | Admitting: Emergency Medicine

## 2015-02-06 DIAGNOSIS — R55 Syncope and collapse: Secondary | ICD-10-CM | POA: Insufficient documentation

## 2015-02-06 DIAGNOSIS — R300 Dysuria: Secondary | ICD-10-CM | POA: Insufficient documentation

## 2015-02-06 DIAGNOSIS — Z720 Tobacco use: Secondary | ICD-10-CM | POA: Insufficient documentation

## 2015-02-06 DIAGNOSIS — I10 Essential (primary) hypertension: Secondary | ICD-10-CM | POA: Insufficient documentation

## 2015-02-06 LAB — CBC
HEMATOCRIT: 38.9 % (ref 35.0–47.0)
Hemoglobin: 13.4 g/dL (ref 12.0–16.0)
MCH: 32.1 pg (ref 26.0–34.0)
MCHC: 34.5 g/dL (ref 32.0–36.0)
MCV: 92.9 fL (ref 80.0–100.0)
Platelets: 150 10*3/uL (ref 150–440)
RBC: 4.18 MIL/uL (ref 3.80–5.20)
RDW: 13.4 % (ref 11.5–14.5)
WBC: 5.9 10*3/uL (ref 3.6–11.0)

## 2015-02-06 LAB — URINALYSIS COMPLETE WITH MICROSCOPIC (ARMC ONLY)
Bacteria, UA: NONE SEEN
Bilirubin Urine: NEGATIVE
Glucose, UA: NEGATIVE mg/dL
HGB URINE DIPSTICK: NEGATIVE
KETONES UR: NEGATIVE mg/dL
NITRITE: NEGATIVE
Protein, ur: NEGATIVE mg/dL
SPECIFIC GRAVITY, URINE: 1.012 (ref 1.005–1.030)
pH: 6 (ref 5.0–8.0)

## 2015-02-06 LAB — COMPREHENSIVE METABOLIC PANEL
ALBUMIN: 4.2 g/dL (ref 3.5–5.0)
ALT: 20 U/L (ref 14–54)
ANION GAP: 7 (ref 5–15)
AST: 27 U/L (ref 15–41)
Alkaline Phosphatase: 50 U/L (ref 38–126)
BUN: 8 mg/dL (ref 6–20)
CO2: 22 mmol/L (ref 22–32)
Calcium: 9.1 mg/dL (ref 8.9–10.3)
Chloride: 113 mmol/L — ABNORMAL HIGH (ref 101–111)
Creatinine, Ser: 0.73 mg/dL (ref 0.44–1.00)
GFR calc Af Amer: >60 mL/min (ref >60)
GFR calc non Af Amer: >60 mL/min (ref >60)
Glucose, Bld: 89 mg/dL (ref 65–99)
POTASSIUM: 3.9 mmol/L (ref 3.5–5.1)
SODIUM: 142 mmol/L (ref 135–145)
Total Bilirubin: 0.5 mg/dL (ref 0.3–1.2)
Total Protein: 6.6 g/dL (ref 6.5–8.1)

## 2015-02-06 MED ORDER — NITROFURANTOIN MONOHYD MACRO 100 MG PO CAPS: 100.0000 mg | ORAL_CAPSULE | Freq: Two times a day (BID) | ORAL | Status: DC

## 2015-02-06 MED ORDER — NITROFURANTOIN MONOHYD MACRO 100 MG PO CAPS
100.0000 mg | ORAL_CAPSULE | Freq: Once | ORAL | Status: AC
  Administered 2015-02-06: 100 mg via ORAL

## 2015-02-06 MED ORDER — HYDROCODONE-ACETAMINOPHEN 5-325 MG PO TABS
ORAL_TABLET | ORAL | Status: AC
  Filled 2015-02-06: qty 1

## 2015-02-06 MED ORDER — IBUPROFEN 800 MG PO TABS
800.0000 mg | ORAL_TABLET | Freq: Three times a day (TID) | ORAL | Status: DC | PRN
Start: 2015-02-06 — End: 2015-04-02

## 2015-02-06 MED ORDER — NITROFURANTOIN MONOHYD MACRO 100 MG PO CAPS
ORAL_CAPSULE | ORAL | Status: AC
  Administered 2015-02-06: 100 mg via ORAL
  Filled 2015-02-06: qty 1

## 2015-02-06 MED ORDER — HYDROCODONE-ACETAMINOPHEN 5-325 MG PO TABS
ORAL_TABLET | ORAL | Status: AC
  Administered 2015-02-06: 2 via ORAL
  Filled 2015-02-06: qty 1

## 2015-02-06 MED ORDER — HYDROCODONE-ACETAMINOPHEN 5-325 MG PO TABS
2.0000 | ORAL_TABLET | Freq: Once | ORAL | Status: AC
  Administered 2015-02-06: 2 via ORAL

## 2015-02-06 NOTE — ED Provider Notes (Signed)
[facility] Emergency Department Provider Note     Time seen: ----------------------------------------- 4:05 PM on 02/06/2015 -----------------------------------------    I have reviewed the triage vital signs and the nursing notes.   HISTORY  Chief Complaint Urinary Frequency and Near Syncope    HPI Amber Campos is a 39 y.o. female who presents ER for urinary symptoms started this morning when she woke up. Patient reports extensive history of same, she's had around 10 bladder infections in the last 6 months. Patient was told she needed urologic follow-up but has no insurance. Complains of dysuria lower abdominal pain and lightheadedness. Except symptoms better urination makes worse.   Past Medical History  Diagnosis Date  . Anxiety   . HLD (hyperlipidemia)   . Depression   . HTN (hypertension)   . GERD (gastroesophageal reflux disease)   . Migraine   . Urinary frequency   . History of recurrent UTI (urinary tract infection)   . Perimenopausal atrophic vaginitis   . Dyspareunia     Patient Active Problem List   Diagnosis Date Noted  . CONTUSION OF BUTTOCK 08/10/2009  . HEAD TRAUMA, CLOSED 08/10/2009  . CHEST WALL PAIN, ACUTE 08/08/2009  . SHORTNESS OF BREATH 06/05/2009  . CHEST PAIN 05/15/2009  . ABDOMINAL PAIN OTHER SPECIFIED SITE 05/09/2009  . SINUSITIS- ACUTE-NOS 08/17/2008  . URI 06/23/2008  . TOBACCO USE 05/30/2008  . Depressive disorder, not elsewhere classified 05/30/2008  . ALLERGIC RHINITIS 05/30/2008  . GERD 05/30/2008  . HEADACHE 05/30/2008    Past Surgical History  Procedure Laterality Date  . Abdominal hysterectomy  2015    Complete    Allergies Oxycodone-acetaminophen  Social History History  Substance Use Topics  . Smoking status: Current Every Day Smoker -- 0.50 packs/day    Types: Cigarettes  . Smokeless tobacco: Never Used  . Alcohol Use: 0.0 oz/week    0 Standard drinks or equivalent per week   Comment: occasional    Review of Systems Constitutional: Negative for fever. Eyes: Negative for visual changes. ENT: Negative for sore throat. Cardiovascular: Negative for chest pain. Positive for lightheadedness Respiratory: Negative for shortness of breath. Gastrointestinal: Negative for abdominal pain, vomiting and diarrhea. Genitourinary: Positive for dysuria Musculoskeletal: Negative for back pain. Skin: Negative for rash. Neurological: Negative for headaches, focal weakness or numbness.  10-point ROS otherwise negative.  ____________________________________________   PHYSICAL EXAM:  VITAL SIGNS: ED Triage Vitals  Enc Vitals Group     BP --      Pulse Rate 02/06/15 1405 80     Resp 02/06/15 1405 20     Temp 02/06/15 1405 98.2 F (36.8 C)     Temp Source 02/06/15 1405 Oral     SpO2 02/06/15 1405 96 %     Weight 02/06/15 1405 191 lb (86.637 kg)     Height 02/06/15 1405  (1.626 m)     Head Cir --      Peak Flow --      Pain Score 02/06/15 1409 6     Pain Loc --      Pain Edu? --      Excl. in GC? --     Constitutional: Alert and oriented. Well appearing and in no distress. Eyes: Conjunctivae are normal. PERRL. Normal extraocular movements. ENT   Head: Normocephalic and atraumatic.   Nose: No congestion/rhinnorhea.   Mouth/Throat: Mucous membranes are moist.   Neck: No stridor. Hematological/Lymphatic/Immunilogical: No cervical lymphadenopathy. Cardiovascular: Normal rate, regular rhythm. Normal and symmetric  distal pulses are present in all extremities. No murmurs, rubs, or gallops. Respiratory: Normal respiratory effort without tachypnea nor retractions. Breath sounds are clear and equal bilaterally. No wheezes/rales/rhonchi. Gastrointestinal: Soft with mild suprapubic tenderness. No distention. No abdominal bruits. There is no CVA tenderness. Musculoskeletal: Nontender with normal range of motion in all extremities. No joint effusions.  No  lower extremity tenderness nor edema. Neurologic:  Normal speech and language. No gross focal neurologic deficits are appreciated. Speech is normal. No gait instability. Skin:  Skin is warm, dry and intact. No rash noted. Psychiatric: Mood and affect are normal. Speech and behavior are normal. Patient exhibits appropriate insight and judgment. ____________________________________________  ED COURSE:  Pertinent labs & imaging results that were available during my care of the patient were reviewed by me and considered in my medical decision making (see chart for details). I will check labs and urinalysis. ____________________________________________    LABS (pertinent positives/negatives)  Labs Reviewed  URINALYSIS COMPLETEWITH MICROSCOPIC ([facility] ONLY) - Abnormal; Notable for the following:    Color, Urine YELLOW (*)    APPearance CLEAR (*)    Leukocytes, UA TRACE (*)    Squamous Epithelial / LPF 0-5 (*)    All other components within normal limits  COMPREHENSIVE METABOLIC PANEL - Abnormal; Notable for the following:    Chloride 113 (*)    All other components within normal limits  CBC    RADIOLOGY None  ____________________________________________  FINAL ASSESSMENT AND PLAN  Dysuria  Plan: We'll send urine culture as this is very frequent for a bladder infections. Patient denies any vaginal discharge or concerns for pelvic infection. Also denies any vaginal bleeding. We'll place on Macrobid, patient states pretty and doesn't really help her. She is stable for outpatient follow-up.   Emily Filbert, MD   Emily Filbert, MD 02/06/15 [phone]

## 2015-02-06 NOTE — Discharge Instructions (Signed)

## 2015-02-06 NOTE — ED Notes (Signed)
Patient to ED with c/o urinary symptoms that started this morning when she woke up, reports history of same. Also reports feeling light headed.

## 2015-02-08 LAB — URINE CULTURE: Special Requests: NORMAL

## 2015-03-08 ENCOUNTER — Emergency Department
Admission: EM | Admit: 2015-03-08 | Discharge: 2015-03-08 | Disposition: A | Discharge status: Home / Self Care | Payer: Self-pay | Attending: Emergency Medicine | Admitting: Emergency Medicine

## 2015-03-08 ENCOUNTER — Emergency Department: Payer: Self-pay

## 2015-03-08 DIAGNOSIS — M545 Low back pain: Secondary | ICD-10-CM | POA: Insufficient documentation

## 2015-03-08 DIAGNOSIS — I10 Essential (primary) hypertension: Secondary | ICD-10-CM | POA: Insufficient documentation

## 2015-03-08 DIAGNOSIS — R109 Unspecified abdominal pain: Secondary | ICD-10-CM

## 2015-03-08 DIAGNOSIS — Z720 Tobacco use: Secondary | ICD-10-CM | POA: Insufficient documentation

## 2015-03-08 DIAGNOSIS — R1031 Right lower quadrant pain: Secondary | ICD-10-CM | POA: Insufficient documentation

## 2015-03-08 LAB — URINALYSIS COMPLETE WITH MICROSCOPIC (ARMC ONLY)
Bilirubin Urine: NEGATIVE
Glucose, UA: NEGATIVE mg/dL
HGB URINE DIPSTICK: NEGATIVE
Ketones, ur: NEGATIVE mg/dL
Leukocytes, UA: NEGATIVE
Nitrite: NEGATIVE
Protein, ur: NEGATIVE mg/dL
Specific Gravity, Urine: 1.012 (ref 1.005–1.030)
pH: 6 (ref 5.0–8.0)

## 2015-03-08 LAB — COMPREHENSIVE METABOLIC PANEL
ALBUMIN: 4.3 g/dL (ref 3.5–5.0)
ALT: 14 U/L (ref 14–54)
AST: 20 U/L (ref 15–41)
Alkaline Phosphatase: 51 U/L (ref 38–126)
Anion gap: 5 (ref 5–15)
BILIRUBIN TOTAL: 0.7 mg/dL (ref 0.3–1.2)
BUN: 8 mg/dL (ref 6–20)
CO2: 24 mmol/L (ref 22–32)
Calcium: 9 mg/dL (ref 8.9–10.3)
Chloride: 113 mmol/L — ABNORMAL HIGH (ref 101–111)
Creatinine, Ser: 0.66 mg/dL (ref 0.44–1.00)
GFR calc Af Amer: >60 mL/min (ref >60)
GLUCOSE: 128 mg/dL — AB (ref 65–99)
Potassium: 3.7 mmol/L (ref 3.5–5.1)
SODIUM: 142 mmol/L (ref 135–145)
Total Protein: 6.8 g/dL (ref 6.5–8.1)

## 2015-03-08 LAB — CBC
HCT: 39.1 % (ref 35.0–47.0)
HEMOGLOBIN: 13.7 g/dL (ref 12.0–16.0)
MCH: 32 pg (ref 26.0–34.0)
MCHC: 35 g/dL (ref 32.0–36.0)
MCV: 91.4 fL (ref 80.0–100.0)
Platelets: 128 10*3/uL — ABNORMAL LOW (ref 150–440)
RBC: 4.28 MIL/uL (ref 3.80–5.20)
RDW: 12.7 % (ref 11.5–14.5)
WBC: 6.9 10*3/uL (ref 3.6–11.0)

## 2015-03-08 LAB — LIPASE, BLOOD: Lipase: 19 U/L — ABNORMAL LOW (ref 22–51)

## 2015-03-08 MED ORDER — KETOROLAC TROMETHAMINE 60 MG/2ML IM SOLN
60.0000 mg | Freq: Once | INTRAMUSCULAR | Status: AC
  Administered 2015-03-08: 60 mg via INTRAMUSCULAR
  Filled 2015-03-08: qty 2

## 2015-03-08 MED ORDER — IBUPROFEN 800 MG PO TABS: 800.0000 mg | ORAL_TABLET | Freq: Three times a day (TID) | ORAL | Status: DC | PRN

## 2015-03-08 NOTE — ED Provider Notes (Signed)
[facility] Emergency Department Provider Note     Time seen: ----------------------------------------- 3:20 PM on 03/08/2015 -----------------------------------------    I have reviewed the triage vital signs and the nursing notes.   HISTORY  Chief Complaint Flank Pain    HPI Amber Campos is a 39 y.o. female who presents ER for right flank pain that radiates in the right lower quadrant since Monday. Patient was seen by her primary care doctor had negative urinalysis and negative culture. Patient states pain continues with nausea, denies vomiting or diarrhea. Sharp stabbing pain, nothing makes it better or worse. Denies any dysuria currently   Past Medical History  Diagnosis Date  . Anxiety   . HLD (hyperlipidemia)   . Depression   . HTN (hypertension)   . GERD (gastroesophageal reflux disease)   . Migraine   . Urinary frequency   . History of recurrent UTI (urinary tract infection)   . Perimenopausal atrophic vaginitis   . Dyspareunia     Patient Active Problem List   Diagnosis Date Noted  . CONTUSION OF BUTTOCK 08/10/2009  . HEAD TRAUMA, CLOSED 08/10/2009  . CHEST WALL PAIN, ACUTE 08/08/2009  . SHORTNESS OF BREATH 06/05/2009  . CHEST PAIN 05/15/2009  . ABDOMINAL PAIN OTHER SPECIFIED SITE 05/09/2009  . SINUSITIS- ACUTE-NOS 08/17/2008  . URI 06/23/2008  . TOBACCO USE 05/30/2008  . Depressive disorder, not elsewhere classified 05/30/2008  . ALLERGIC RHINITIS 05/30/2008  . GERD 05/30/2008  . HEADACHE 05/30/2008    Past Surgical History  Procedure Laterality Date  . Abdominal hysterectomy  2015    Complete  . Abdominal hysterectomy    . Knee arthroscopy Bilateral   . Appendectomy      Allergies Oxycodone-acetaminophen  Social History History  Substance Use Topics  . Smoking status: Current Every Day Smoker -- 0.50 packs/day    Types: Cigarettes  . Smokeless tobacco: Never Used  . Alcohol Use: 0.0 oz/week    0 Standard  drinks or equivalent per week     Comment: occasional    Review of Systems Constitutional: Negative for fever. Eyes: Negative for visual changes. ENT: Negative for sore throat. Cardiovascular: Negative for chest pain. Respiratory: Negative for shortness of breath. Gastrointestinal: Positive for right flank pain Genitourinary: Negative for dysuria. Musculoskeletal: Positive for right lower back pain Skin: Negative for rash. Neurological: Negative for headaches, focal weakness or numbness.  10-point ROS otherwise negative.  ____________________________________________   PHYSICAL EXAM:  VITAL SIGNS: ED Triage Vitals  Enc Vitals Group     BP 03/08/15 1427 99/61 mmHg     Pulse Rate 03/08/15 1427 88     Resp 03/08/15 1427 16     Temp 03/08/15 1427 98.2 F (36.8 C)     Temp Source 03/08/15 1427 Oral     SpO2 03/08/15 1427 95 %     Weight 03/08/15 1427 191 lb (86.637 kg)     Height 03/08/15 1427  (1.626 m)     Head Cir --      Peak Flow --      Pain Score 03/08/15 1427 9     Pain Loc --      Pain Edu? --      Excl. in GC? --     Constitutional: Alert and oriented. Well appearing and in no distress. Eyes: Conjunctivae are normal. PERRL. Normal extraocular movements. ENT   Head: Normocephalic and atraumatic.   Nose: No congestion/rhinnorhea.   Mouth/Throat: Mucous membranes are moist.   Neck:  No stridor. Cardiovascular: Normal rate, regular rhythm. Normal and symmetric distal pulses are present in all extremities. No murmurs, rubs, or gallops. Respiratory: Normal respiratory effort without tachypnea nor retractions. Breath sounds are clear and equal bilaterally. No wheezes/rales/rhonchi. Gastrointestinal: Soft and nontender. No distention. No abdominal bruits. There is no CVA tenderness, pain is lower than the right CVA area Musculoskeletal: Nontender with normal range of motion in all extremities. Patient with tenderness in the right lower back, in the  right paraspinous muscles. Neurologic:  Normal speech and language. No gross focal neurologic deficits are appreciated. Speech is normal. No gait instability. Skin:  Skin is warm, dry and intact. No rash noted. Psychiatric: Mood and affect are normal. Speech and behavior are normal. Patient exhibits appropriate insight and judgment.  ____________________________________________  ED COURSE:  Pertinent labs & imaging results that were available during my care of the patient were reviewed by me and considered in my medical decision making (see chart for details). Patient may need imaging to rule out renal colic. ____________________________________________    LABS (pertinent positives/negatives)  Labs Reviewed  LIPASE, BLOOD - Abnormal; Notable for the following:    Lipase 19 (*)    All other components within normal limits  COMPREHENSIVE METABOLIC PANEL - Abnormal; Notable for the following:    Chloride 113 (*)    Glucose, Bld 128 (*)    All other components within normal limits  CBC - Abnormal; Notable for the following:    Platelets 128 (*)    All other components within normal limits  URINALYSIS COMPLETEWITH MICROSCOPIC ([facility] ONLY) - Abnormal; Notable for the following:    Color, Urine YELLOW (*)    APPearance HAZY (*)    Bacteria, UA RARE (*)    Squamous Epithelial / LPF 6-30 (*)    All other components within normal limits    RADIOLOGY Images were viewed by me  CT renal protocol  IMPRESSION: No acute obstructing urinary tract or ureteral calculus. No hydronephrosis or obstructive uropathy.  Prior appendectomy and hysterectomy.  No acute intra-abdominal or pelvic process by noncontrast CT.  ____________________________________________  FINAL ASSESSMENT AND PLAN  Flank pain  Plan: Patient with labs and imaging as dictated above. Likely musculoskeletal in origin. Her workup here has unremarkable. No signs of infection, normal CT scan. She stable for outpatient  follow-up   Emily Filbert, MD   Emily Filbert, MD 03/08/15 [phone]

## 2015-03-08 NOTE — ED Notes (Signed)
Pt c/o right flank pain that radiates into the RLQ pain since Monday and was seen at PCP and had negative UA and culture.the patient c/o continues pain with nausea.Marland Kitchendenie vomiting and diarrhea

## 2015-03-08 NOTE — Discharge Instructions (Signed)
Flank Pain °Flank pain refers to pain that is located on the side of the body between the upper abdomen and the back. The pain may occur over a short period of time (acute) or may be long-term or reoccurring (chronic). It may be mild or severe. Flank pain can be caused by many things. °CAUSES  °Some of the more common causes of flank pain include: °· Muscle strains.   °· Muscle spasms.   °· A disease of your spine (vertebral disk disease).   °· A lung infection (pneumonia).   °· Fluid around your lungs (pulmonary edema).   °· A kidney infection.   °· Kidney stones.   °· A very painful skin rash caused by the chickenpox virus (shingles).   °· Gallbladder disease.   °HOME CARE INSTRUCTIONS  °Home care will depend on the cause of your pain. In general, °· Rest as directed by your caregiver. °· Drink enough fluids to keep your urine clear or pale yellow. °· Only take over-the-counter or prescription medicines as directed by your caregiver. Some medicines may help relieve the pain. °· Tell your caregiver about any changes in your pain. °· Follow up with your caregiver as directed. °SEEK IMMEDIATE MEDICAL CARE IF:  °· Your pain is not controlled with medicine.   °· You have new or worsening symptoms. °· Your pain increases.   °· You have abdominal pain.   °· You have shortness of breath.   °· You have persistent nausea or vomiting.   °· You have swelling in your abdomen.   °· You feel faint or pass out.   °· You have blood in your urine. °· You have a fever or persistent symptoms for more than 2-3 days. °· You have a fever and your symptoms suddenly get worse. °MAKE SURE YOU:  °· Understand these instructions. °· Will watch your condition. °· Will get help right away if you are not doing well or get worse. °Document Released: 09/19/2005 Document Revised: 04/22/2012 Document Reviewed: 03/12/2012 °[employer]Care® Patient Information ©2015 [employer]. This information is not intended to replace advice given to you by your  health care provider. Make sure you discuss any questions you have with your health care provider. ° °

## 2015-03-09 ENCOUNTER — Other Ambulatory Visit: Payer: Self-pay

## 2015-03-09 ENCOUNTER — Inpatient Hospital Stay: Payer: Medicaid Other

## 2015-03-09 DIAGNOSIS — D801 Nonfamilial hypogammaglobulinemia: Secondary | ICD-10-CM | POA: Insufficient documentation

## 2015-03-10 ENCOUNTER — Other Ambulatory Visit: Payer: Self-pay

## 2015-03-28 ENCOUNTER — Ambulatory Visit: Payer: Self-pay | Admitting: Urology

## 2015-03-28 ENCOUNTER — Encounter: Payer: Self-pay | Admitting: Urology

## 2015-04-01 DIAGNOSIS — I10 Essential (primary) hypertension: Secondary | ICD-10-CM | POA: Insufficient documentation

## 2015-04-01 DIAGNOSIS — K2970 Gastritis, unspecified, without bleeding: Secondary | ICD-10-CM | POA: Insufficient documentation

## 2015-04-01 DIAGNOSIS — Z720 Tobacco use: Secondary | ICD-10-CM | POA: Insufficient documentation

## 2015-04-01 LAB — COMPREHENSIVE METABOLIC PANEL
ALT: 15 U/L (ref 14–54)
AST: 22 U/L (ref 15–41)
Albumin: 4.4 g/dL (ref 3.5–5.0)
Alkaline Phosphatase: 52 U/L (ref 38–126)
Anion gap: 8 (ref 5–15)
BILIRUBIN TOTAL: 0.4 mg/dL (ref 0.3–1.2)
BUN: 7 mg/dL (ref 6–20)
CO2: 24 mmol/L (ref 22–32)
CREATININE: 0.64 mg/dL (ref 0.44–1.00)
Calcium: 9 mg/dL (ref 8.9–10.3)
Chloride: 109 mmol/L (ref 101–111)
GFR calc Af Amer: >60 mL/min (ref >60)
Glucose, Bld: 86 mg/dL (ref 65–99)
Potassium: 3.6 mmol/L (ref 3.5–5.1)
Sodium: 141 mmol/L (ref 135–145)
TOTAL PROTEIN: 6.8 g/dL (ref 6.5–8.1)

## 2015-04-01 LAB — URINALYSIS COMPLETE WITH MICROSCOPIC (ARMC ONLY)
BILIRUBIN URINE: NEGATIVE
GLUCOSE, UA: NEGATIVE mg/dL
Hgb urine dipstick: NEGATIVE
KETONES UR: NEGATIVE mg/dL
LEUKOCYTES UA: NEGATIVE
NITRITE: NEGATIVE
Protein, ur: NEGATIVE mg/dL
SPECIFIC GRAVITY, URINE: 1.004 — AB (ref 1.005–1.030)
pH: 6 (ref 5.0–8.0)

## 2015-04-01 LAB — CBC
HCT: 39.4 % (ref 35.0–47.0)
Hemoglobin: 13.6 g/dL (ref 12.0–16.0)
MCH: 31.9 pg (ref 26.0–34.0)
MCHC: 34.6 g/dL (ref 32.0–36.0)
MCV: 92.5 fL (ref 80.0–100.0)
PLATELETS: 162 10*3/uL (ref 150–440)
RBC: 4.26 MIL/uL (ref 3.80–5.20)
RDW: 12.4 % (ref 11.5–14.5)
WBC: 7.3 10*3/uL (ref 3.6–11.0)

## 2015-04-01 LAB — LIPASE, BLOOD: Lipase: 31 U/L (ref 22–51)

## 2015-04-01 MED ORDER — ONDANSETRON 4 MG PO TBDP
4.0000 mg | ORAL_TABLET | Freq: Once | ORAL | Status: AC | PRN
  Administered 2015-04-01: 4 mg via ORAL
  Filled 2015-04-01: qty 1

## 2015-04-01 NOTE — ED Notes (Signed)
Pt reports epigastric pain that started last night and tonight is worse and now vomiting.

## 2015-04-02 ENCOUNTER — Encounter: Payer: Self-pay | Admitting: Emergency Medicine

## 2015-04-02 ENCOUNTER — Emergency Department: Payer: Medicaid Other

## 2015-04-02 ENCOUNTER — Emergency Department
Admission: EM | Admit: 2015-04-02 | Discharge: 2015-04-02 | Disposition: A | Discharge status: Home / Self Care | Payer: Medicaid Other | Attending: Emergency Medicine | Admitting: Emergency Medicine

## 2015-04-02 DIAGNOSIS — K2970 Gastritis, unspecified, without bleeding: Secondary | ICD-10-CM

## 2015-04-02 MED ORDER — GI COCKTAIL ~~LOC~~
30.0000 mL | ORAL | Status: AC
  Administered 2015-04-02: 30 mL via ORAL
  Filled 2015-04-02: qty 30

## 2015-04-02 MED ORDER — ONDANSETRON 8 MG PO TBDP
8.0000 mg | ORAL_TABLET | Freq: Once | ORAL | Status: AC
  Administered 2015-04-02: 8 mg via ORAL
  Filled 2015-04-02: qty 1

## 2015-04-02 MED ORDER — FAMOTIDINE 20 MG PO TABS
40.0000 mg | ORAL_TABLET | Freq: Once | ORAL | Status: AC
  Administered 2015-04-02: 40 mg via ORAL
  Filled 2015-04-02: qty 2

## 2015-04-02 NOTE — ED Notes (Signed)
Error in undo discharge. See prior notes.

## 2015-04-02 NOTE — ED Notes (Signed)
Pt left ER without receiving discharge instructions. MD made aware. MD explained [place] results to pt. Pt let ER with a 10/10 pain rating. MD made aware and advised to continue with discharge disposition.

## 2015-04-02 NOTE — ED Provider Notes (Signed)
[facility] Emergency Department Provider Note  ____________________________________________  Time seen: 12:45 AM  I have reviewed the triage vital signs and the nursing notes.   HISTORY  Chief Complaint Abdominal Pain    HPI Amber Campos is a 39 y.o. female who complains of epigastric pain that started last night after eating dinner and has been persistent since then. It is nonradiating, no nausea vomiting or diarrhea. Admits moderate in intensity. Feels like sharp pain. No chest pain shortness of breath dysuria frequency urgency or syncope. No fevers or chills.     Past Medical History  Diagnosis Date  . Anxiety   . HLD (hyperlipidemia)   . Depression   . HTN (hypertension)   . GERD (gastroesophageal reflux disease)   . Migraine   . Urinary frequency   . History of recurrent UTI (urinary tract infection)   . Perimenopausal atrophic vaginitis   . Dyspareunia     Patient Active Problem List   Diagnosis Date Noted  . 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 CONTUSION OF BUTTOCK 08/10/2009  . HEAD TRAUMA, CLOSED 08/10/2009  . CHEST WALL PAIN, ACUTE 08/08/2009  . SHORTNESS OF BREATH 06/05/2009  . CHEST PAIN 05/15/2009  . ABDOMINAL PAIN OTHER SPECIFIED SITE 05/09/2009  . SINUSITIS- ACUTE-NOS 08/17/2008  . URI 06/23/2008  . TOBACCO USE 05/30/2008  . Depressive disorder, not elsewhere classified 05/30/2008  . ALLERGIC RHINITIS 05/30/2008  . GERD 05/30/2008  . HEADACHE 05/30/2008    Past Surgical History  Procedure Laterality Date  . Abdominal hysterectomy  2015    Complete  . Abdominal hysterectomy    . Knee arthroscopy Bilateral   . Appendectomy      Current Outpatient Rx  Name  Route  Sig  Dispense  Refill  . clonazePAM (KLONOPIN) 1 MG tablet               . sertraline (ZOLOFT) 100 MG tablet                 Allergies Other and Oxycodone-acetaminophen  Family History  Problem Relation Age of Onset  . Hypertension  Mother   . Hypertension Father   . Hyperlipidemia Mother   . Hyperlipidemia Father   . Diabetes Mother   . Transient ischemic attack Father   . Nephrolithiasis Mother     Father  . Stroke Mother   . Heart disease Mother   . Prostate cancer      Grandfather    Social History Social History  Substance Use Topics  . Smoking status: Current Every Day Smoker -- 0.50 packs/day    Types: Cigarettes  . Smokeless tobacco: Never Used  . Alcohol Use: 0.0 oz/week    0 Standard drinks or equivalent per week     Comment: occasional    Review of Systems  Constitutional: No fever or chills. No weight changes Eyes:No blurry vision or double vision.  ENT: No sore throat. Cardiovascular: No chest pain. Respiratory: No dyspnea or cough. Gastrointestinal: Abdominal pain as above.  No BRBPR or melena. Genitourinary: Negative for dysuria, urinary retention, bloody urine, or difficulty urinating. Musculoskeletal: Negative for back pain. No joint swelling or pain. Skin: Negative for rash. Neurological: Negative for headaches, focal weakness or numbness. Psychiatric:No anxiety or depression.   Endocrine:No hot/cold intolerance, changes in energy, or sleep difficulty.  10-point ROS otherwise negative.  ____________________________________________   PHYSICAL EXAM:  VITAL SIGNS: ED Triage Vitals  Enc Vitals Group     BP 04/01/15 2149 134/92 mmHg  Pulse Rate 04/01/15 2149 83     Resp 04/01/15 2149 18     Temp 04/01/15 2149 98.4 F (36.9 C)     Temp Source 04/01/15 2149 Oral     SpO2 04/01/15 2149 99 %     Weight 04/01/15 2149 197 lb (89.359 kg)     Height 04/01/15 2149  (1.626 m)     Head Cir --      Peak Flow --      Pain Score 04/01/15 2151 7     Pain Loc --      Pain Edu? --      Excl. in GC? --      Constitutional: Alert and oriented. Well appearing and in no distress. Eyes: No scleral icterus. No conjunctival pallor. PERRL. EOMI ENT   Head: Normocephalic and  atraumatic.   Nose: No congestion/rhinnorhea. No septal hematoma   Mouth/Throat: MMM, no pharyngeal erythema. No peritonsillar mass. No uvula shift.   Neck: No stridor. No SubQ emphysema. No meningismus. Hematological/Lymphatic/Immunilogical: No cervical lymphadenopathy. Cardiovascular: RRR. Normal and symmetric distal pulses are present in all extremities. No murmurs, rubs, or gallops. Respiratory: Normal respiratory effort without tachypnea nor retractions. Breath sounds are clear and equal bilaterally. No wheezes/rales/rhonchi. Gastrointestinal: Epigastric and right upper quadrant tenderness. No distention. There is no CVA tenderness.  No rebound, rigidity, or guarding. Genitourinary: deferred Musculoskeletal: Nontender with normal range of motion in all extremities. No joint effusions.  No lower extremity tenderness.  No edema. Neurologic:   Normal speech and language.  CN 2-10 normal. Motor grossly intact. No pronator drift.  Normal gait. No gross focal neurologic deficits are appreciated.  Skin:  Skin is warm, dry and intact. No rash noted.  No petechiae, purpura, or bullae. Psychiatric: Mood and affect are normal. Speech and behavior are normal. Patient exhibits appropriate insight and judgment.  ____________________________________________    LABS (pertinent positives/negatives) (all labs ordered are listed, but only abnormal results are displayed) Labs Reviewed  URINALYSIS COMPLETEWITH MICROSCOPIC ([facility] ONLY) - Abnormal; Notable for the following:    Color, Urine STRAW (*)    APPearance HAZY (*)    Specific Gravity, Urine 1.004 (*)    Bacteria, UA FEW (*)    Squamous Epithelial / LPF 6-30 (*)    All other components within normal limits  LIPASE, BLOOD  COMPREHENSIVE METABOLIC PANEL  CBC   ____________________________________________   EKG    ____________________________________________    RADIOLOGY  Ultrasound right upper quadrant  normal  ____________________________________________   PROCEDURES  ____________________________________________   INITIAL IMPRESSION / ASSESSMENT AND PLAN / ED COURSE  Pertinent labs & imaging results that were available during my care of the patient were reviewed by me and considered in my medical decision making (see chart for details).  Patient presents with abdominal pain concerning for biliary colic. We'll check labs and ultrasound. We'll also give GI cocktail and Zantac as this may be related to gastritis.  ----------------------------------------- 3:43 AM on 04/02/2015 -----------------------------------------  I discussed the results with the patient after obtaining the ultrasound about 2:30 AM. I advised that based on labs and ultrasound, this does not appear to be related to any serious GI pathology such as pancreatitis or cholangitis cholecystitis or hepatitis. I have low suspicion for other acute pathology at this time and this does appear to be related to gastritis. I advised the patient to continue on Carafate and Zantac for the next week or 2. Before I was able to print up discharge instructions  or prescriptions, the patient advised the nurse that she did not want to wait for discharge instructions and that she was just going to leave at that time. Without finding any acute emergency conditions, with the patient apparently had a medical decision-making capacity, there is no reason to retain her in the ED any longer. She left the ED without her discharge instructions but after my conversation with her discussing her results and follow-up plan. Have not written any prescriptions for her since she had already left.  ____________________________________________   FINAL CLINICAL IMPRESSION(S) / ED DIAGNOSES  Final diagnoses:  Gastritis      Sharman Cheek, MD 04/02/15 [phone]

## 2015-04-02 NOTE — Discharge Instructions (Signed)

## 2015-06-30 ENCOUNTER — Emergency Department
Admission: EM | Admit: 2015-06-30 | Discharge: 2015-06-30 | Disposition: A | Discharge status: Home / Self Care | Payer: Self-pay | Attending: Emergency Medicine | Admitting: Emergency Medicine

## 2015-06-30 ENCOUNTER — Encounter: Payer: Self-pay | Admitting: *Deleted

## 2015-06-30 DIAGNOSIS — R112 Nausea with vomiting, unspecified: Secondary | ICD-10-CM | POA: Insufficient documentation

## 2015-06-30 DIAGNOSIS — I10 Essential (primary) hypertension: Secondary | ICD-10-CM | POA: Insufficient documentation

## 2015-06-30 DIAGNOSIS — R1013 Epigastric pain: Secondary | ICD-10-CM | POA: Insufficient documentation

## 2015-06-30 DIAGNOSIS — F17210 Nicotine dependence, cigarettes, uncomplicated: Secondary | ICD-10-CM | POA: Insufficient documentation

## 2015-06-30 LAB — URINALYSIS COMPLETE WITH MICROSCOPIC (ARMC ONLY)
BACTERIA UA: NONE SEEN
Bilirubin Urine: NEGATIVE
GLUCOSE, UA: NEGATIVE mg/dL
Hgb urine dipstick: NEGATIVE
Leukocytes, UA: NEGATIVE
Nitrite: NEGATIVE
Protein, ur: NEGATIVE mg/dL
Specific Gravity, Urine: 1.016 (ref 1.005–1.030)
pH: 5 (ref 5.0–8.0)

## 2015-06-30 LAB — COMPREHENSIVE METABOLIC PANEL
ALBUMIN: 4.4 g/dL (ref 3.5–5.0)
ALT: 46 U/L (ref 14–54)
AST: 57 U/L — AB (ref 15–41)
Alkaline Phosphatase: 53 U/L (ref 38–126)
Anion gap: 8 (ref 5–15)
BILIRUBIN TOTAL: 0.7 mg/dL (ref 0.3–1.2)
BUN: 7 mg/dL (ref 6–20)
CHLORIDE: 109 mmol/L (ref 101–111)
CO2: 22 mmol/L (ref 22–32)
Calcium: 9.1 mg/dL (ref 8.9–10.3)
Creatinine, Ser: 0.53 mg/dL (ref 0.44–1.00)
GFR calc Af Amer: >60 mL/min (ref >60)
GFR calc non Af Amer: >60 mL/min (ref >60)
GLUCOSE: 114 mg/dL — AB (ref 65–99)
POTASSIUM: 3.9 mmol/L (ref 3.5–5.1)
Sodium: 139 mmol/L (ref 135–145)
Total Protein: 7.2 g/dL (ref 6.5–8.1)

## 2015-06-30 LAB — CBC
HEMATOCRIT: 42.4 % (ref 35.0–47.0)
Hemoglobin: 14.7 g/dL (ref 12.0–16.0)
MCH: 31.5 pg (ref 26.0–34.0)
MCHC: 34.7 g/dL (ref 32.0–36.0)
MCV: 90.8 fL (ref 80.0–100.0)
Platelets: 154 10*3/uL (ref 150–440)
RBC: 4.66 MIL/uL (ref 3.80–5.20)
RDW: 12.9 % (ref 11.5–14.5)
WBC: 5.6 10*3/uL (ref 3.6–11.0)

## 2015-06-30 LAB — LIPASE, BLOOD: Lipase: 24 U/L (ref 11–51)

## 2015-06-30 MED ORDER — SODIUM CHLORIDE 0.9 % IV SOLN
Freq: Once | INTRAVENOUS | Status: AC
  Administered 2015-06-30: 15:00:00 via INTRAVENOUS

## 2015-06-30 MED ORDER — MORPHINE SULFATE (PF) 4 MG/ML IV SOLN
4.0000 mg | Freq: Once | INTRAVENOUS | Status: AC
  Administered 2015-06-30: 4 mg via INTRAVENOUS
  Filled 2015-06-30: qty 1

## 2015-06-30 MED ORDER — PROMETHAZINE HCL 25 MG PO TABS: 25.0000 mg | ORAL_TABLET | Freq: Four times a day (QID) | ORAL | Status: DC | PRN

## 2015-06-30 MED ORDER — PROMETHAZINE HCL 25 MG/ML IJ SOLN
INTRAMUSCULAR | Status: AC
  Administered 2015-06-30: 25 mg via INTRAVENOUS
  Filled 2015-06-30: qty 1

## 2015-06-30 MED ORDER — PROMETHAZINE HCL 25 MG/ML IJ SOLN
25.0000 mg | Freq: Once | INTRAMUSCULAR | Status: AC
  Administered 2015-06-30: 25 mg via INTRAVENOUS

## 2015-06-30 MED ORDER — FAMOTIDINE IN NACL 20-0.9 MG/50ML-% IV SOLN
20.0000 mg | Freq: Once | INTRAVENOUS | Status: AC
  Administered 2015-06-30: 20 mg via INTRAVENOUS
  Filled 2015-06-30: qty 50

## 2015-06-30 MED ORDER — FAMOTIDINE 20 MG PO TABS: 20.0000 mg | ORAL_TABLET | Freq: Every day | ORAL | Status: DC

## 2015-06-30 MED ORDER — SODIUM CHLORIDE 0.9 % IV SOLN: Freq: Once | INTRAVENOUS | Status: DC

## 2015-06-30 MED ORDER — ONDANSETRON HCL 4 MG/2ML IJ SOLN
4.0000 mg | Freq: Once | INTRAMUSCULAR | Status: DC
  Filled 2015-06-30: qty 2

## 2015-06-30 NOTE — ED Notes (Signed)
Pt to ed with c/o n/v since wed pt states she went out to eat with friends and since then has vomited frequently.

## 2015-06-30 NOTE — Discharge Instructions (Signed)
Abdominal Pain, Adult °Many things can cause abdominal pain. Usually, abdominal pain is not caused by a disease and will improve without treatment. It can often be observed and treated at home. Your health care provider will do a physical exam and possibly order blood tests and X-rays to help determine the seriousness of your pain. However, in many cases, more time must pass before a clear cause of the pain can be found. Before that point, your health care provider may not know if you need more testing or further treatment. °HOME CARE INSTRUCTIONS °Monitor your abdominal pain for any changes. The following actions may help to alleviate any discomfort you are experiencing: °· Only take over-the-counter or prescription medicines as directed by your health care provider. °· Do not take laxatives unless directed to do so by your health care provider. °· Try a clear liquid diet (broth, tea, or water) as directed by your health care provider. Slowly move to a bland diet as tolerated. °SEEK MEDICAL CARE IF: °· You have unexplained abdominal pain. °· You have abdominal pain associated with nausea or diarrhea. °· You have pain when you urinate or have a bowel movement. °· You experience abdominal pain that wakes you in the night. °· You have abdominal pain that is worsened or improved by eating food. °· You have abdominal pain that is worsened with eating fatty foods. °· You have a fever. °SEEK IMMEDIATE MEDICAL CARE IF: °· Your pain does not go away within 2 hours. °· You keep throwing up (vomiting). °· Your pain is felt only in portions of the abdomen, such as the right side or the left lower portion of the abdomen. °· You pass bloody or black tarry stools. °MAKE SURE YOU: °· Understand these instructions. °· Will watch your condition. °· Will get help right away if you are not doing well or get worse. °  °This information is not intended to replace advice given to you by your health care provider. Make sure you discuss  any questions you have with your health care provider. °  °Document Released: 05/08/2005 Document Revised: 04/19/2015 Document Reviewed: 04/07/2013 °Elsevier Interactive Patient Education ©2016 [employer] ° °Nausea and Vomiting °Nausea is a sick feeling that often comes before throwing up (vomiting). Vomiting is a reflex where stomach contents come out of your mouth. Vomiting can cause severe loss of body fluids (dehydration). Children and elderly adults can become dehydrated quickly, especially if they also have diarrhea. Nausea and vomiting are symptoms of a condition or disease. It is important to find the cause of your symptoms. °CAUSES  °· Direct irritation of the stomach lining. This irritation can result from increased acid production (gastroesophageal reflux disease), infection, food poisoning, taking certain medicines (such as nonsteroidal anti-inflammatory drugs), alcohol use, or tobacco use. °· Signals from the brain. These signals could be caused by a headache, heat exposure, an inner ear disturbance, increased pressure in the brain from injury, infection, a tumor, or a concussion, pain, emotional stimulus, or metabolic problems. °· An obstruction in the gastrointestinal tract (bowel obstruction). °· Illnesses such as diabetes, hepatitis, gallbladder problems, appendicitis, kidney problems, cancer, sepsis, atypical symptoms of a heart attack, or eating disorders. °· Medical treatments such as chemotherapy and radiation. °· Receiving medicine that makes you sleep (general anesthetic) during surgery. °DIAGNOSIS °Your caregiver may ask for tests to be done if the problems do not improve after a few days. Tests may also be done if symptoms are severe or if the reason for the   nausea and vomiting is not clear. Tests may include: °· Urine tests. °· Blood tests. °· Stool tests. °· Cultures (to look for evidence of infection). °· X-rays or other imaging studies. °Test results can help your caregiver make  decisions about treatment or the need for additional tests. °TREATMENT °You need to stay well hydrated. Drink frequently but in small amounts. You may wish to drink water, sports drinks, clear broth, or eat frozen ice pops or gelatin dessert to help stay hydrated. When you eat, eating slowly may help prevent nausea. There are also some antinausea medicines that may help prevent nausea. °HOME CARE INSTRUCTIONS  °· Take all medicine as directed by your caregiver. °· If you do not have an appetite, do not force yourself to eat. However, you must continue to drink fluids. °· If you have an appetite, eat a normal diet unless your caregiver tells you differently. °¨ Eat a variety of complex carbohydrates (rice, wheat, potatoes, bread), lean meats, yogurt, fruits, and vegetables. °¨ Avoid high-fat foods because they are more difficult to digest. °· Drink enough water and fluids to keep your urine clear or pale yellow. °· If you are dehydrated, ask your caregiver for specific rehydration instructions. Signs of dehydration may include: °¨ Severe thirst. °¨ Dry lips and mouth. °¨ Dizziness. °¨ Dark urine. °¨ Decreasing urine frequency and amount. °¨ Confusion. °¨ Rapid breathing or pulse. °SEEK IMMEDIATE MEDICAL CARE IF:  °· You have blood or brown flecks (like coffee grounds) in your vomit. °· You have black or bloody stools. °· You have a severe headache or stiff neck. °· You are confused. °· You have severe abdominal pain. °· You have chest pain or trouble breathing. °· You do not urinate at least once every 8 hours. °· You develop cold or clammy skin. °· You continue to vomit for longer than 24 to 48 hours. °· You have a fever. °MAKE SURE YOU:  °· Understand these instructions. °· Will watch your condition. °· Will get help right away if you are not doing well or get worse. °  °This information is not intended to replace advice given to you by your health care provider. Make sure you discuss any questions you have with  your health care provider. °  °Document Released: 07/29/2005 Document Revised: 10/21/2011 Document Reviewed: 12/26/2010 °Elsevier Interactive Patient Education ©2016 [employer]vier [employer]. ° °

## 2015-06-30 NOTE — ED Notes (Signed)
Pt reports nausea and vomiting starting Tuesday night, pt denies fever and other symptoms

## 2015-06-30 NOTE — ED Provider Notes (Signed)
[facility] Emergency Department Provider Note     Time seen: ----------------------------------------- 3:12 PM on 06/30/2015 -----------------------------------------    I have reviewed the triage vital signs and the nursing notes.   HISTORY  Chief Complaint Emesis    HPI Amber Campos is a 39 y.o. female who presents ER for nausea and vomiting since Wednesday. Patient states she went into with friends and since then has been vomiting frequently, can't keep food or liquids down. She doesn't have any diarrhea, does have upper abdominal pain. She denies any fevers chills or other complaints. Patient denies this being a frequent problem for her.   Past Medical History  Diagnosis Date  . Anxiety   . HLD (hyperlipidemia)   . Depression   . HTN (hypertension)   . GERD (gastroesophageal reflux disease)   . Migraine   . Urinary frequency   . History of recurrent UTI (urinary tract infection)   . Perimenopausal atrophic vaginitis   . Dyspareunia     Patient Active Problem List   Diagnosis Date Noted  . 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 CONTUSION OF BUTTOCK 08/10/2009  . HEAD TRAUMA, CLOSED 08/10/2009  . CHEST WALL PAIN, ACUTE 08/08/2009  . SHORTNESS OF BREATH 06/05/2009  . CHEST PAIN 05/15/2009  . ABDOMINAL PAIN OTHER SPECIFIED SITE 05/09/2009  . SINUSITIS- ACUTE-NOS 08/17/2008  . URI 06/23/2008  . TOBACCO USE 05/30/2008  . Depressive disorder, not elsewhere classified 05/30/2008  . ALLERGIC RHINITIS 05/30/2008  . GERD 05/30/2008  . HEADACHE 05/30/2008    Past Surgical History  Procedure Laterality Date  . Abdominal hysterectomy  2015    Complete  . Abdominal hysterectomy    . Knee arthroscopy Bilateral   . Appendectomy      Allergies Other and Oxycodone-acetaminophen  Social History Social History  Substance Use Topics  . Smoking status: Current Every Day Smoker -- 0.50 packs/day    Types: Cigarettes  . Smokeless  tobacco: Never Used  . Alcohol Use: 0.0 oz/week    0 Standard drinks or equivalent per week     Comment: occasional    Review of Systems Constitutional: Negative for fever. Eyes: Negative for visual changes. ENT: Negative for sore throat. Cardiovascular: Negative for chest pain. Respiratory: Negative for shortness of breath. Gastrointestinal: Positive for abdominal pain and vomiting Genitourinary: Negative for dysuria. Musculoskeletal: Negative for back pain. Skin: Negative for rash. Neurological: Negative for headaches, focal weakness or numbness.  10-point ROS otherwise negative.  ____________________________________________   PHYSICAL EXAM:  VITAL SIGNS: ED Triage Vitals  Enc Vitals Group     BP 06/30/15 1428 129/96 mmHg     Pulse Rate 06/30/15 1428 85     Resp 06/30/15 1428 18     Temp 06/30/15 1428 97.8 F (36.6 C)     Temp Source 06/30/15 1428 Oral     SpO2 06/30/15 1428 98 %     Weight 06/30/15 1428 190 lb (86.183 kg)     Height 06/30/15 1428  (1.626 m)     Head Cir --      Peak Flow --      Pain Score --      Pain Loc --      Pain Edu? --      Excl. in GC? --     Constitutional: Alert and oriented. Well appearing and in no distress. Eyes: Conjunctivae are normal. PERRL. Normal extraocular movements. ENT   Head: Normocephalic and atraumatic.   Nose: No congestion/rhinnorhea.  Mouth/Throat: Mucous membranes are moist.   Neck: No stridor. Cardiovascular: Normal rate, regular rhythm. Normal and symmetric distal pulses are present in all extremities. No murmurs, rubs, or gallops. Respiratory: Normal respiratory effort without tachypnea nor retractions. Breath sounds are clear and equal bilaterally. No wheezes/rales/rhonchi. Gastrointestinal: Mild nonspecific tenderness, no rebound or guarding. Normal bowel sounds. Musculoskeletal: Nontender with normal range of motion in all extremities. No joint effusions.  No lower extremity tenderness nor  edema. Neurologic:  Normal speech and language. No gross focal neurologic deficits are appreciated. Speech is normal. No gait instability. Skin:  Skin is warm, dry and intact. No rash noted. Psychiatric: Mood and affect are normal. Speech and behavior are normal. Patient exhibits appropriate insight and judgment. ____________________________________________  ED COURSE:  Pertinent labs & imaging results that were available during my care of the patient were reviewed by me and considered in my medical decision making (see chart for details). Patient receive IV fluids, antiemetics and mild pain control. We'll check basic labs and reevaluate. ____________________________________________    LABS (pertinent positives/negatives)  Labs Reviewed  COMPREHENSIVE METABOLIC PANEL - Abnormal; Notable for the following:    Glucose, Bld 114 (*)    AST 57 (*)    All other components within normal limits  URINALYSIS COMPLETEWITH MICROSCOPIC ([facility] ONLY) - Abnormal; Notable for the following:    Color, Urine YELLOW (*)    APPearance CLEAR (*)    Ketones, ur 1+ (*)    Squamous Epithelial / LPF 0-5 (*)    All other components within normal limits  LIPASE, BLOOD  CBC   ____________________________________________  FINAL ASSESSMENT AND PLAN  Abdominal pain, vomiting  Plan: Patient with labs and imaging as dictated above. Patient is feeling better after IV fluid and IV antiemetics. She'll be discharged with Phenergan and Pepcid and encouraged to have close follow-up with her primary care doctor for reevaluation.   Emily FilbertWilliams, Jonathan E, MD   Emily FilbertJonathan E Williams, MD 06/30/15 [phone]

## 2015-07-04 ENCOUNTER — Encounter: Payer: Self-pay | Admitting: Gastroenterology

## 2015-07-05 ENCOUNTER — Other Ambulatory Visit: Payer: Self-pay | Admitting: *Deleted

## 2015-07-05 ENCOUNTER — Encounter: Payer: Self-pay | Admitting: *Deleted

## 2015-07-05 DIAGNOSIS — D801 Nonfamilial hypogammaglobulinemia: Secondary | ICD-10-CM

## 2015-07-10 ENCOUNTER — Inpatient Hospital Stay: Payer: Self-pay | Admitting: Internal Medicine

## 2015-07-10 ENCOUNTER — Other Ambulatory Visit: Payer: Self-pay

## 2015-07-10 ENCOUNTER — Inpatient Hospital Stay: Payer: Self-pay

## 2015-07-10 ENCOUNTER — Encounter: Payer: Self-pay | Admitting: *Deleted

## 2015-07-10 ENCOUNTER — Ambulatory Visit: Payer: Self-pay | Admitting: Internal Medicine

## 2015-07-26 ENCOUNTER — Other Ambulatory Visit: Payer: Self-pay | Admitting: *Deleted

## 2015-07-26 DIAGNOSIS — D801 Nonfamilial hypogammaglobulinemia: Secondary | ICD-10-CM

## 2015-07-27 ENCOUNTER — Encounter: Payer: Self-pay | Admitting: Internal Medicine

## 2015-07-27 ENCOUNTER — Inpatient Hospital Stay (HOSPITAL_BASED_OUTPATIENT_CLINIC_OR_DEPARTMENT_OTHER): Payer: Self-pay | Admitting: Internal Medicine

## 2015-07-27 ENCOUNTER — Inpatient Hospital Stay: Payer: Self-pay | Attending: Internal Medicine

## 2015-07-27 VITALS — BP 136/92 | HR 75 | Temp 98.3°F | Resp 18 | Ht 64.0 in | Wt 191.6 lb

## 2015-07-27 DIAGNOSIS — R55 Syncope and collapse: Secondary | ICD-10-CM

## 2015-07-27 DIAGNOSIS — F17210 Nicotine dependence, cigarettes, uncomplicated: Secondary | ICD-10-CM | POA: Insufficient documentation

## 2015-07-27 DIAGNOSIS — I10 Essential (primary) hypertension: Secondary | ICD-10-CM

## 2015-07-27 DIAGNOSIS — D801 Nonfamilial hypogammaglobulinemia: Secondary | ICD-10-CM

## 2015-07-27 DIAGNOSIS — Z87440 Personal history of urinary (tract) infections: Secondary | ICD-10-CM | POA: Insufficient documentation

## 2015-07-27 DIAGNOSIS — R309 Painful micturition, unspecified: Secondary | ICD-10-CM

## 2015-07-27 DIAGNOSIS — R300 Dysuria: Secondary | ICD-10-CM

## 2015-07-27 DIAGNOSIS — Z79899 Other long term (current) drug therapy: Secondary | ICD-10-CM | POA: Insufficient documentation

## 2015-07-27 DIAGNOSIS — K219 Gastro-esophageal reflux disease without esophagitis: Secondary | ICD-10-CM | POA: Insufficient documentation

## 2015-07-27 DIAGNOSIS — E785 Hyperlipidemia, unspecified: Secondary | ICD-10-CM | POA: Insufficient documentation

## 2015-07-27 DIAGNOSIS — F418 Other specified anxiety disorders: Secondary | ICD-10-CM | POA: Insufficient documentation

## 2015-07-27 DIAGNOSIS — R5382 Chronic fatigue, unspecified: Secondary | ICD-10-CM | POA: Insufficient documentation

## 2015-07-27 DIAGNOSIS — M549 Dorsalgia, unspecified: Secondary | ICD-10-CM | POA: Insufficient documentation

## 2015-07-27 LAB — URINALYSIS COMPLETE WITH MICROSCOPIC (ARMC ONLY)
Bilirubin Urine: NEGATIVE
GLUCOSE, UA: NEGATIVE mg/dL
HGB URINE DIPSTICK: NEGATIVE
Ketones, ur: NEGATIVE mg/dL
Nitrite: NEGATIVE
PH: 5 (ref 5.0–8.0)
PROTEIN: NEGATIVE mg/dL
SPECIFIC GRAVITY, URINE: 1.012 (ref 1.005–1.030)

## 2015-07-27 LAB — CBC WITH DIFFERENTIAL/PLATELET
Basophils Absolute: 0 10*3/uL (ref 0–0.1)
Basophils Relative: 1 %
EOS ABS: 0 10*3/uL (ref 0–0.7)
EOS PCT: 0 %
HCT: 38.5 % (ref 35.0–47.0)
Hemoglobin: 13.5 g/dL (ref 12.0–16.0)
LYMPHS ABS: 2.3 10*3/uL (ref 1.0–3.6)
Lymphocytes Relative: 42 %
MCH: 31.6 pg (ref 26.0–34.0)
MCHC: 35.2 g/dL (ref 32.0–36.0)
MCV: 89.8 fL (ref 80.0–100.0)
MONOS PCT: 6 %
Monocytes Absolute: 0.3 10*3/uL (ref 0.2–0.9)
Neutro Abs: 2.9 10*3/uL (ref 1.4–6.5)
Neutrophils Relative %: 51 %
PLATELETS: 167 10*3/uL (ref 150–440)
RBC: 4.29 MIL/uL (ref 3.80–5.20)
RDW: 13 % (ref 11.5–14.5)
WBC: 5.6 10*3/uL (ref 3.6–11.0)

## 2015-07-27 LAB — CREATININE, SERUM
CREATININE: 0.58 mg/dL (ref 0.44–1.00)
GFR calc Af Amer: >60 mL/min (ref >60)
GFR calc non Af Amer: >60 mL/min (ref >60)

## 2015-07-27 NOTE — Progress Notes (Signed)
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 I reviewed the patient's prior charts/pertinent labs in detail; findings are summarized above.    A very pleasant 39 year old female patient is here for follow-up off hypogammaglobinemia. As per the patient this was incidentally discovered when patient had workup for celiac disease through GI.   Patient complains of having burning pain by urination.  This is an intermittent;  Chronic.  Daily back pain.  She also complains of  Frequent upper respiratory tract infections;  No serious  Sinopulmonary infection stating antibiotics or hospitalizations.   She also had episode of syncope at home;  Denies any seizure-like activities.  It lasted for about 10 minutes.   Otherwise complains of chronic fatigue.  No chest pain no palpitations.   REVIEW OF SYSTEMS:  A complete 10 point review of system is done which is negative except mentioned above/history of present illness.   PAST MEDICAL HISTORY :  Past Medical History  Diagnosis Date  . Anxiety   . HLD (hyperlipidemia)   . Depression   . HTN (hypertension)   . GERD (gastroesophageal reflux disease)   . Migraine   . Urinary frequency   . History of recurrent UTI (urinary tract infection)   . Perimenopausal atrophic vaginitis   . Dyspareunia   . Hypogammaglobulinemia (HCC)   . Chronic constipation   . Dysphagia   . History of chickenpox   . Colon polyp 09/2014    PAST SURGICAL HISTORY :   Past Surgical History  Procedure Laterality Date  . Abdominal hysterectomy  2015    Complete  . Abdominal hysterectomy    . Knee arthroscopy Bilateral 2011, 2015  . Appendectomy    . Tonsillectomy and adenoidectomy    . Cesarean section     . Endometrial ablation    . Esophagogastroduodenoscopy  09/2014  . Colonoscopy  09/2014    polyp removed    FAMILY HISTORY :   Family History  Problem Relation Age of Onset  . Hypertension Mother   . Hyperlipidemia Mother   . Diabetes Mother   . Nephrolithiasis Mother     Father  . Stroke Mother   . Heart disease Mother   . Colon polyps Mother   . Hypertension Father   . Hyperlipidemia Father   . Transient ischemic attack Father   . Deep vein thrombosis    . Prostate cancer Paternal Grandfather     SOCIAL HISTORY:   Social History  Substance Use Topics  . Smoking status: Current Every Day Smoker -- 0.25 packs/day for 20 years    Types: Cigarettes  . Smokeless tobacco: Never Used  . Alcohol Use: 0.0 oz/week    0 Standard drinks or equivalent per week     Comment: occasional wine an dliquor    ALLERGIES:  is allergic to other and oxycodone-acetaminophen.  MEDICATIONS:  Current Outpatient Prescriptions  Medication Sig Dispense Refill  . clonazePAM (KLONOPIN) 1 MG tablet     . promethazine (PHENERGAN) 25 MG tablet Take 1 tablet (25 mg total) by mouth every 6 (six) hours as needed for nausea or vomiting. 20 tablet 0  . sertraline (ZOLOFT) 100 MG tablet      No current facility-administered medications for this visit.  PHYSICAL EXAMINATION:   BP 136/92 mmHg  Pulse 75  Temp(Src) 98.3 F (36.8 C) (Tympanic)  Resp 18  Ht  (1.626 m)  Wt 191 lb 9.3 oz (86.9 kg)  BMI 32.87 kg/m2  Filed Weights   07/27/15 1049  Weight: 191 lb 9.3 oz (86.9 kg)    GENERAL: Well-nourished well-developed; Alert, no distress and comfortable.  EYES: no pallor or icterus OROPHARYNX: no thrush or ulceration; good dentition  NECK: supple, no masses felt LYMPH:  no palpable lymphadenopathy in the cervical, axillary or inguinal regions LUNGS: clear to auscultation and  No wheeze or crackles HEART/CVS: regular rate & rhythm and no murmurs; No lower extremity edema ABDOMEN:abdomen  soft, non-tender and normal bowel sounds Musculoskeletal:no cyanosis of digits and no clubbing  PSYCH: alert & oriented x 3 with fluent speech NEURO: no focal motor/sensory deficits SKIN:  no rashes or significant lesions  LABORATORY DATA:  I have reviewed the data as listed    Component Value Date/Time   NA 139 06/30/2015 1436   NA 143 07/22/2014 1841   K 3.9 06/30/2015 1436   K 3.5 07/22/2014 1841   CL 109 06/30/2015 1436   CL 111* 07/22/2014 1841   CO2 22 06/30/2015 1436   CO2 24 07/22/2014 1841   GLUCOSE 114* 06/30/2015 1436   GLUCOSE 134* 07/22/2014 1841   BUN 7 06/30/2015 1436   BUN 10 07/22/2014 1841   CREATININE 0.58 07/27/2015 1001   CREATININE 0.75 07/22/2014 1841   CALCIUM 9.1 06/30/2015 1436   CALCIUM 8.3* 07/22/2014 1841   PROT 7.2 06/30/2015 1436   PROT 7.4 02/25/2014 1730   ALBUMIN 4.4 06/30/2015 1436   ALBUMIN 4.0 02/25/2014 1730   AST 57* 06/30/2015 1436   AST 16 02/25/2014 1730   ALT 46 06/30/2015 1436   ALT 21 02/25/2014 1730   ALKPHOS 53 06/30/2015 1436   ALKPHOS 61 02/25/2014 1730   BILITOT 0.7 06/30/2015 1436   BILITOT 0.6 02/25/2014 1730   GFRNONAA >60 07/27/2015 1001   GFRNONAA >60 07/22/2014 1841   GFRNONAA >60 02/25/2014 1730   GFRAA >60 07/27/2015 1001   GFRAA >60 07/22/2014 1841   GFRAA >60 02/25/2014 1730    No results found for: SPEP, UPEP  Lab Results  Component Value Date   WBC 5.6 07/27/2015   NEUTROABS 2.9 07/27/2015   HGB 13.5 07/27/2015   HCT 38.5 07/27/2015   MCV 89.8 07/27/2015   PLT 167 07/27/2015      Chemistry      Component Value Date/Time   NA 139 06/30/2015 1436   NA 143 07/22/2014 1841   K 3.9 06/30/2015 1436   K 3.5 07/22/2014 1841   CL 109 06/30/2015 1436   CL 111* 07/22/2014 1841   CO2 22 06/30/2015 1436   CO2 24 07/22/2014 1841   BUN 7 06/30/2015 1436   BUN 10 07/22/2014 1841   CREATININE 0.58 07/27/2015 1001   CREATININE 0.75 07/22/2014 1841      Component Value Date/Time   CALCIUM 9.1  06/30/2015 1436   CALCIUM 8.3* 07/22/2014 1841   ALKPHOS 53 06/30/2015 1436   ALKPHOS 61 02/25/2014 1730   AST 57* 06/30/2015 1436   AST 16 02/25/2014 1730   ALT 46 06/30/2015 1436   ALT 21 02/25/2014 1730   BILITOT 0.7 06/30/2015 1436   BILITOT 0.6 02/25/2014 1730       RADIOGRAPHIC STUDIES: I have personally reviewed the radiological images as listed and agreed with the findings in the  report. No results found.   ASSESSMENT & PLAN:   #  Mild hypogammaglobinemia-  Patient does not have any   seriousFrequent sinopulmonary infections;  However does complain of frequent " UTIs" &  Mild  URIs-  Not needing any hospitalizations/ antibiotics.  The clinical significance of mild hypogammaglobulinemia is not clear to me.  I would recommend evaluation by immunology.   I would not recommend in her immunoglobulin therapy unless  Recommended by immunology/ Infectious disease.  Refer patient immunology.  #  Syncopal episode-  Question vagal versus other causes.  Recommend evaluation with primary care physician.  # 15 minutes face-to-face with the patient discussing the above plan of care; more than 50% of time spent on prognosis/ natural history; counseling and coordination.      Earna CoderGovinda R Brahmanday, MD 07/27/2015 12:00 PM

## 2015-07-27 NOTE — Progress Notes (Signed)
Pt states that she is tired and she can remember this has been the same since summer time.  She feels like she has UTI- she has frequency of urination, and foul smell. No pain.  Pt use to be on b/p med and took herself off of it because her b/p was low and has not taken her meds in 6 months. She does not got to [facility] and she does not have insurance. She has passed out 2 weeks ago and she was laying in bed-did not feel well but no fever.  She was on the phone talking to her mom and next thing she knew her dad was in her house and waking her up.

## 2015-07-28 LAB — PROTEIN ELECTROPHORESIS, SERUM
A/G RATIO SPE: 1.7 (ref 0.7–1.7)
ALBUMIN ELP: 3.8 g/dL (ref 2.9–4.4)
ALPHA-1-GLOBULIN: 0.2 g/dL (ref 0.0–0.4)
Alpha-2-Globulin: 0.6 g/dL (ref 0.4–1.0)
Beta Globulin: 1 g/dL (ref 0.7–1.3)
GAMMA GLOBULIN: 0.6 g/dL (ref 0.4–1.8)
GLOBULIN, TOTAL: 2.3 g/dL (ref 2.2–3.9)
TOTAL PROTEIN ELP: 6.1 g/dL (ref 6.0–8.5)

## 2015-07-29 LAB — URINE CULTURE: Culture: 80000

## 2015-08-01 ENCOUNTER — Telehealth: Payer: Self-pay | Admitting: Internal Medicine

## 2015-08-01 MED ORDER — CIPROFLOXACIN HCL 500 MG PO TABS: 500.0000 mg | ORAL_TABLET | Freq: Two times a day (BID) | ORAL | Status: DC

## 2015-08-01 NOTE — Telephone Encounter (Signed)
Recommend ciprofloaxcin 500mg  BD x 5 days/e- prescribed. Please inform pt.

## 2015-08-01 NOTE — Telephone Encounter (Signed)
Patient made aware. I encouraged the patient to drink plenty of fluids with the abx.

## 2015-08-09 ENCOUNTER — Telehealth: Payer: Self-pay | Admitting: *Deleted

## 2015-08-09 NOTE — Telephone Encounter (Signed)
[facility] because we did not hear back with appt for pt.  The pt was made appt 09/07/15 at 10 am with Dr. Marland McalpineSheikh.  The patient was notified and also sent her a packet in the mail.

## 2015-08-24 ENCOUNTER — Emergency Department: Payer: Self-pay

## 2015-08-24 ENCOUNTER — Encounter: Payer: Self-pay | Admitting: Emergency Medicine

## 2015-08-24 ENCOUNTER — Emergency Department
Admission: EM | Admit: 2015-08-24 | Discharge: 2015-08-24 | Disposition: A | Discharge status: Home / Self Care | Payer: Self-pay | Attending: Emergency Medicine | Admitting: Emergency Medicine

## 2015-08-24 DIAGNOSIS — Z792 Long term (current) use of antibiotics: Secondary | ICD-10-CM | POA: Insufficient documentation

## 2015-08-24 DIAGNOSIS — R1013 Epigastric pain: Secondary | ICD-10-CM | POA: Insufficient documentation

## 2015-08-24 DIAGNOSIS — I10 Essential (primary) hypertension: Secondary | ICD-10-CM | POA: Insufficient documentation

## 2015-08-24 DIAGNOSIS — R1011 Right upper quadrant pain: Secondary | ICD-10-CM | POA: Insufficient documentation

## 2015-08-24 DIAGNOSIS — F17210 Nicotine dependence, cigarettes, uncomplicated: Secondary | ICD-10-CM | POA: Insufficient documentation

## 2015-08-24 DIAGNOSIS — R1012 Left upper quadrant pain: Secondary | ICD-10-CM | POA: Insufficient documentation

## 2015-08-24 DIAGNOSIS — Z79899 Other long term (current) drug therapy: Secondary | ICD-10-CM | POA: Insufficient documentation

## 2015-08-24 DIAGNOSIS — R112 Nausea with vomiting, unspecified: Secondary | ICD-10-CM | POA: Insufficient documentation

## 2015-08-24 LAB — LIPASE, BLOOD: LIPASE: 28 U/L (ref 11–51)

## 2015-08-24 LAB — CBC
HEMATOCRIT: 37.7 % (ref 35.0–47.0)
HEMOGLOBIN: 13.3 g/dL (ref 12.0–16.0)
MCH: 31 pg (ref 26.0–34.0)
MCHC: 35.2 g/dL (ref 32.0–36.0)
MCV: 88.1 fL (ref 80.0–100.0)
Platelets: 128 10*3/uL — ABNORMAL LOW (ref 150–440)
RBC: 4.28 MIL/uL (ref 3.80–5.20)
RDW: 12.8 % (ref 11.5–14.5)
WBC: 8.1 10*3/uL (ref 3.6–11.0)

## 2015-08-24 LAB — URINALYSIS COMPLETE WITH MICROSCOPIC (ARMC ONLY)
Bilirubin Urine: NEGATIVE
Glucose, UA: NEGATIVE mg/dL
HGB URINE DIPSTICK: NEGATIVE
Ketones, ur: NEGATIVE mg/dL
LEUKOCYTES UA: NEGATIVE
NITRITE: NEGATIVE
PROTEIN: NEGATIVE mg/dL
SPECIFIC GRAVITY, URINE: 1.004 — AB (ref 1.005–1.030)
pH: 7 (ref 5.0–8.0)

## 2015-08-24 LAB — COMPREHENSIVE METABOLIC PANEL
ALBUMIN: 4.5 g/dL (ref 3.5–5.0)
ALT: 15 U/L (ref 14–54)
ANION GAP: 8 (ref 5–15)
AST: 17 U/L (ref 15–41)
Alkaline Phosphatase: 55 U/L (ref 38–126)
BILIRUBIN TOTAL: 0.9 mg/dL (ref 0.3–1.2)
BUN: 9 mg/dL (ref 6–20)
CHLORIDE: 107 mmol/L (ref 101–111)
CO2: 25 mmol/L (ref 22–32)
Calcium: 9.3 mg/dL (ref 8.9–10.3)
Creatinine, Ser: 0.55 mg/dL (ref 0.44–1.00)
GFR calc Af Amer: >60 mL/min (ref >60)
GFR calc non Af Amer: >60 mL/min (ref >60)
GLUCOSE: 101 mg/dL — AB (ref 65–99)
POTASSIUM: 3.8 mmol/L (ref 3.5–5.1)
SODIUM: 140 mmol/L (ref 135–145)
TOTAL PROTEIN: 7 g/dL (ref 6.5–8.1)

## 2015-08-24 MED ORDER — ONDANSETRON HCL 4 MG/2ML IJ SOLN
4.0000 mg | Freq: Once | INTRAMUSCULAR | Status: AC
  Administered 2015-08-24: 4 mg via INTRAVENOUS
  Filled 2015-08-24: qty 2

## 2015-08-24 MED ORDER — FAMOTIDINE IN NACL 20-0.9 MG/50ML-% IV SOLN
20.0000 mg | Freq: Once | INTRAVENOUS | Status: AC
  Administered 2015-08-24: 20 mg via INTRAVENOUS
  Filled 2015-08-24: qty 50

## 2015-08-24 MED ORDER — RANITIDINE HCL 150 MG PO CAPS: 150.0000 mg | ORAL_CAPSULE | Freq: Two times a day (BID) | ORAL | Status: DC

## 2015-08-24 MED ORDER — PROMETHAZINE HCL 25 MG PO TABS: 25.0000 mg | ORAL_TABLET | Freq: Four times a day (QID) | ORAL | Status: DC | PRN

## 2015-08-24 MED ORDER — KETOROLAC TROMETHAMINE 30 MG/ML IJ SOLN
INTRAMUSCULAR | Status: AC
  Administered 2015-08-24: 30 mg via INTRAVENOUS
  Filled 2015-08-24: qty 1

## 2015-08-24 MED ORDER — SUCRALFATE 1 G PO TABS: 1.0000 g | ORAL_TABLET | Freq: Four times a day (QID) | ORAL | Status: DC

## 2015-08-24 NOTE — ED Notes (Signed)
Papers placed at front desk

## 2015-08-24 NOTE — ED Notes (Signed)
Patient here complaining of right sided upper abdominal pain starting yesterday. Saw her PCP - Amber Campos on Tuesday 1/10 - labwork drawn. States she has vomiting after she eats every night, pain was dull but now it's sharp, and has nausea today. "I also think I've got a sinus infection" states she has drainage. Alert and oriented.

## 2015-08-24 NOTE — ED Provider Notes (Signed)
[facility] Emergency Department Provider Note  ____________________________________________  Time seen: 9:30 AM  I have reviewed the triage vital signs and the nursing notes.   HISTORY  Chief Complaint Abdominal Pain    HPI Amber Campos is a 40 y.o. female who complains of right upper quadrant abdominal pain that started yesterday. She is also having some nausea and vomiting about an hour to an hour and a half after eating every evening. Denies positional changes noted exertional or pleuritic pain no chest pain or shortness of breath. No fever or diarrhea, no bloody output.     Past Medical History  Diagnosis Date  . Anxiety   . HLD (hyperlipidemia)   . Depression   . HTN (hypertension)   . GERD (gastroesophageal reflux disease)   . Migraine   . Urinary frequency   . History of recurrent UTI (urinary tract infection)   . Perimenopausal atrophic vaginitis   . Dyspareunia   . Hypogammaglobulinemia (HCC)   . Chronic constipation   . Dysphagia   . History of chickenpox   . Colon polyp 09/2014     Patient Active Problem List   Diagnosis Date Noted  . 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 CONTUSION OF BUTTOCK 08/10/2009  . HEAD TRAUMA, CLOSED 08/10/2009  . CHEST WALL PAIN, ACUTE 08/08/2009  . SHORTNESS OF BREATH 06/05/2009  . CHEST PAIN 05/15/2009  . ABDOMINAL PAIN OTHER SPECIFIED SITE 05/09/2009  . SINUSITIS- ACUTE-NOS 08/17/2008  . URI 06/23/2008  . TOBACCO USE 05/30/2008  . Depressive disorder, not elsewhere classified 05/30/2008  . ALLERGIC RHINITIS 05/30/2008  . GERD 05/30/2008  . HEADACHE 05/30/2008     Past Surgical History  Procedure Laterality Date  . Abdominal hysterectomy  2015    Complete  . Abdominal hysterectomy    . Knee arthroscopy Bilateral 2011, 2015  . Appendectomy    . Tonsillectomy and adenoidectomy    . Cesarean section    . Endometrial ablation    . Esophagogastroduodenoscopy  09/2014  . Colonoscopy   09/2014    polyp removed     Current Outpatient Rx  Name  Route  Sig  Dispense  Refill  . clonazePAM (KLONOPIN) 1 MG tablet   Oral   Take 1 mg by mouth 2 (two) times daily.          Marland Kitchen esomeprazole (NEXIUM) 40 MG capsule   Oral   Take 40 mg by mouth daily at 12 noon.         . sertraline (ZOLOFT) 100 MG tablet   Oral   Take 100 mg by mouth at bedtime.          . ciprofloxacin (CIPRO) 500 MG tablet   Oral   Take 1 tablet (500 mg total) by mouth 2 (two) times daily. Patient not taking: Reported on 08/24/2015   10 tablet   0   . promethazine (PHENERGAN) 25 MG tablet   Oral   Take 1 tablet (25 mg total) by mouth every 6 (six) hours as needed for nausea or vomiting.   15 tablet   0   . ranitidine (ZANTAC) 150 MG capsule   Oral   Take 1 capsule (150 mg total) by mouth 2 (two) times daily.   28 capsule   0   . sucralfate (CARAFATE) 1 g tablet   Oral   Take 1 tablet (1 g total) by mouth 4 (four) times daily.   120 tablet   1  Allergies Oxycodone-acetaminophen and Other   Family History  Problem Relation Age of Onset  . Hypertension Mother   . Hyperlipidemia Mother   . Diabetes Mother   . Nephrolithiasis Mother     Father  . Stroke Mother   . Heart disease Mother   . Colon polyps Mother   . Hypertension Father   . Hyperlipidemia Father   . Transient ischemic attack Father   . Deep vein thrombosis    . Prostate cancer Paternal Grandfather     Social History Social History  Substance Use Topics  . Smoking status: Current Every Day Smoker -- 0.25 packs/day for 20 years    Types: Cigarettes  . Smokeless tobacco: Never Used  . Alcohol Use: 0.0 oz/week    0 Standard drinks or equivalent per week     Comment: occasional wine an dliquor    Review of Systems  Constitutional:   No fever or chills. No weight changes Eyes:   No blurry vision or double vision.  ENT:   No sore throat. Cardiovascular:   No chest pain. Respiratory:   No dyspnea or  cough. Gastrointestinal:   Positive abdominal pain with vomiting.  No BRBPR or melena. Genitourinary:   Negative for dysuria, urinary retention, bloody urine, or difficulty urinating. Musculoskeletal:   Negative for back pain. No joint swelling or pain. Skin:   Negative for rash. Neurological:   Negative for headaches, focal weakness or numbness. Psychiatric:  No anxiety or depression.   Endocrine:  No hot/cold intolerance, changes in energy, or sleep difficulty.  10-point ROS otherwise negative.  ____________________________________________   PHYSICAL EXAM:  VITAL SIGNS: ED Triage Vitals  Enc Vitals Group     BP 08/24/15 0906 131/90 mmHg     Pulse Rate 08/24/15 0906 92     Resp 08/24/15 0906 18     Temp 08/24/15 0906 98.2 F (36.8 C)     Temp Source 08/24/15 0906 Oral     SpO2 08/24/15 0906 97 %     Weight 08/24/15 0906 194 lb (87.998 kg)     Height 08/24/15 0906 5\' 4"  (1.626 m)     Head Cir --      Peak Flow --      Pain Score 08/24/15 0913 8     Pain Loc --      Pain Edu? --      Excl. in GC? --     Vital signs reviewed, nursing assessments reviewed.   Constitutional:   Alert and oriented. Well appearing and in no distress. Eyes:   No scleral icterus. No conjunctival pallor. PERRL. EOMI ENT   Head:   Normocephalic and atraumatic.   Nose:   No congestion/rhinnorhea. No septal hematoma   Mouth/Throat:   MMM, no pharyngeal erythema. No peritonsillar mass. No uvula shift.   Neck:   No stridor. No SubQ emphysema. No meningismus. Hematological/Lymphatic/Immunilogical:   No cervical lymphadenopathy. Cardiovascular:   RRR. Normal and symmetric distal pulses are present in all extremities. No murmurs, rubs, or gallops. Respiratory:   Normal respiratory effort without tachypnea nor retractions. Breath sounds are clear and equal bilaterally. No wheezes/rales/rhonchi. Gastrointestinal:   Soft with bilateral upper quadrant and epigastric abdominal tenderness.. No  distention. There is no CVA tenderness.  No rebound, rigidity, or guarding. Genitourinary:   deferred Musculoskeletal:   Nontender with normal range of motion in all extremities. No joint effusions.  No lower extremity tenderness.  No edema. Neurologic:   Normal speech and language.  CN 2-10 normal. Motor grossly intact. No pronator drift.  Normal gait. No gross focal neurologic deficits are appreciated.  Skin:    Skin is warm, dry and intact. No rash noted.  No petechiae, purpura, or bullae. Psychiatric:   Mood and affect are normal. Speech and behavior are normal. Patient exhibits appropriate insight and judgment.  ____________________________________________    LABS (pertinent positives/negatives) (all labs ordered are listed, but only abnormal results are displayed) Labs Reviewed  COMPREHENSIVE METABOLIC PANEL - Abnormal; Notable for the following:    Glucose, Bld 101 (*)    All other components within normal limits  CBC - Abnormal; Notable for the following:    Platelets 128 (*)    All other components within normal limits  URINALYSIS COMPLETEWITH MICROSCOPIC ([facility] ONLY) - Abnormal; Notable for the following:    Color, Urine STRAW (*)    APPearance CLEAR (*)    Specific Gravity, Urine 1.004 (*)    Bacteria, UA RARE (*)    Squamous Epithelial / LPF 0-5 (*)    All other components within normal limits  LIPASE, BLOOD   ____________________________________________   EKG  Interpreted by me  Date: 08/24/2015  Rate: 88  Rhythm: normal sinus rhythm  QRS Axis: normal  Intervals: normal  ST/T Wave abnormalities: normal  Conduction Disutrbances: none  Narrative Interpretation: unremarkable      ____________________________________________    RADIOLOGY  Ultrasound right upper quadrant unremarkable  ____________________________________________   PROCEDURES   ____________________________________________   INITIAL IMPRESSION / ASSESSMENT AND PLAN / ED  COURSE  Pertinent labs & imaging results that were available during my care of the patient were reviewed by me and considered in my medical decision making (see chart for details).  Patient presents with upper abdominal pain with right upper quadrant tenderness and postprandial vomiting. We'll get ultrasound along with labs. IV fluids for now  .----------------------------------------- 1:01 PM on 08/24/2015 -----------------------------------------  workup negative. Feels better after antacids.We'll discharge home on GI regimen to follow up with primary care, follow-up with GI if symptoms do not improve with antacids.     ____________________________________________   FINAL CLINICAL IMPRESSION(S) / ED DIAGNOSES  Final diagnoses:  Bilateral upper abdominal pain      Sharman CheekPhillip Florentina Marquart, MD 08/24/15 1301

## 2015-08-24 NOTE — ED Notes (Signed)
Pt left d/c papers, this RN called pt. Pt states she will pick up papers in am, 08/25/15 at front desk.

## 2015-08-24 NOTE — Discharge Instructions (Signed)

## 2015-08-31 ENCOUNTER — Emergency Department: Payer: Self-pay

## 2015-08-31 ENCOUNTER — Encounter: Payer: Self-pay | Admitting: Emergency Medicine

## 2015-08-31 ENCOUNTER — Emergency Department
Admission: EM | Admit: 2015-08-31 | Discharge: 2015-08-31 | Disposition: A | Discharge status: Home / Self Care | Payer: Self-pay | Attending: Emergency Medicine | Admitting: Emergency Medicine

## 2015-08-31 DIAGNOSIS — F17210 Nicotine dependence, cigarettes, uncomplicated: Secondary | ICD-10-CM | POA: Insufficient documentation

## 2015-08-31 DIAGNOSIS — R531 Weakness: Secondary | ICD-10-CM

## 2015-08-31 DIAGNOSIS — I10 Essential (primary) hypertension: Secondary | ICD-10-CM | POA: Insufficient documentation

## 2015-08-31 DIAGNOSIS — G43109 Migraine with aura, not intractable, without status migrainosus: Secondary | ICD-10-CM | POA: Insufficient documentation

## 2015-08-31 DIAGNOSIS — Z79899 Other long term (current) drug therapy: Secondary | ICD-10-CM | POA: Insufficient documentation

## 2015-08-31 DIAGNOSIS — G43809 Other migraine, not intractable, without status migrainosus: Secondary | ICD-10-CM

## 2015-08-31 LAB — DIFFERENTIAL
BASOS ABS: 0.1 10*3/uL (ref 0–0.1)
BASOS PCT: 1 %
EOS ABS: 0 10*3/uL (ref 0–0.7)
EOS PCT: 0 %
LYMPHS ABS: 2.4 10*3/uL (ref 1.0–3.6)
Lymphocytes Relative: 43 %
Monocytes Absolute: 0.4 10*3/uL (ref 0.2–0.9)
Monocytes Relative: 7 %
NEUTROS PCT: 49 %
Neutro Abs: 2.8 10*3/uL (ref 1.4–6.5)

## 2015-08-31 LAB — COMPREHENSIVE METABOLIC PANEL
ALBUMIN: 4.4 g/dL (ref 3.5–5.0)
ALT: 17 U/L (ref 14–54)
ANION GAP: 7 (ref 5–15)
AST: 17 U/L (ref 15–41)
Alkaline Phosphatase: 56 U/L (ref 38–126)
BILIRUBIN TOTAL: 0.6 mg/dL (ref 0.3–1.2)
BUN: 10 mg/dL (ref 6–20)
CHLORIDE: 108 mmol/L (ref 101–111)
CO2: 25 mmol/L (ref 22–32)
Calcium: 9.3 mg/dL (ref 8.9–10.3)
Creatinine, Ser: 0.69 mg/dL (ref 0.44–1.00)
GFR calc Af Amer: >60 mL/min (ref >60)
GFR calc non Af Amer: >60 mL/min (ref >60)
GLUCOSE: 96 mg/dL (ref 65–99)
POTASSIUM: 3.9 mmol/L (ref 3.5–5.1)
SODIUM: 140 mmol/L (ref 135–145)
TOTAL PROTEIN: 7 g/dL (ref 6.5–8.1)

## 2015-08-31 LAB — APTT: APTT: 27 s (ref 24–36)

## 2015-08-31 LAB — CBC
HCT: 40.3 % (ref 35.0–47.0)
Hemoglobin: 13.8 g/dL (ref 12.0–16.0)
MCH: 31.2 pg (ref 26.0–34.0)
MCHC: 34.4 g/dL (ref 32.0–36.0)
MCV: 90.7 fL (ref 80.0–100.0)
PLATELETS: 147 10*3/uL — AB (ref 150–440)
RBC: 4.44 MIL/uL (ref 3.80–5.20)
RDW: 12.5 % (ref 11.5–14.5)
WBC: 5.6 10*3/uL (ref 3.6–11.0)

## 2015-08-31 LAB — GLUCOSE, CAPILLARY: GLUCOSE-CAPILLARY: 94 mg/dL (ref 65–99)

## 2015-08-31 MED ORDER — SODIUM CHLORIDE 0.9 % IV BOLUS (SEPSIS)
1000.0000 mL | Freq: Once | INTRAVENOUS | Status: AC
  Administered 2015-08-31: 1000 mL via INTRAVENOUS

## 2015-08-31 MED ORDER — DIPHENHYDRAMINE HCL 50 MG/ML IJ SOLN
25.0000 mg | Freq: Once | INTRAMUSCULAR | Status: AC
  Administered 2015-08-31: 25 mg via INTRAVENOUS
  Filled 2015-08-31: qty 1

## 2015-08-31 MED ORDER — KETOROLAC TROMETHAMINE 30 MG/ML IJ SOLN
INTRAMUSCULAR | Status: AC
  Filled 2015-08-31: qty 1

## 2015-08-31 MED ORDER — METOCLOPRAMIDE HCL 10 MG PO TABS: 10.0000 mg | ORAL_TABLET | Freq: Four times a day (QID) | ORAL | Status: DC | PRN

## 2015-08-31 MED ORDER — METOCLOPRAMIDE HCL 5 MG/ML IJ SOLN
10.0000 mg | Freq: Once | INTRAMUSCULAR | Status: AC
  Administered 2015-08-31: 10 mg via INTRAVENOUS
  Filled 2015-08-31: qty 2

## 2015-08-31 MED ORDER — KETOROLAC TROMETHAMINE 30 MG/ML IJ SOLN
30.0000 mg | Freq: Once | INTRAMUSCULAR | Status: AC
  Administered 2015-08-31: 30 mg via INTRAVENOUS
  Filled 2015-08-31: qty 1

## 2015-08-31 NOTE — Consult Note (Signed)
Referring Physician: Silverio Lay    Chief Complaint: Headache, right sided numbness  HPI: Amber Campos is an 40 y.o. female who reports that she has been having a headache for a while now.  Today awakened at baseline.  After work was with some friends and had the acute onset of a left sided headache that is throbbing and sharp. Patient unable to rate. Patient was then noted by friends to have right facial droop.  She noted numbness on the right side of her face and on the right side of her body.n Speech affected due to numbness.  Is having difficulty with gait as well.  Has never had focal symptoms with her headaches in the past.   Initial NIHSS of 3.    Date last known well: 08/31/2015 Time last known well: Time: 10:30 tPA Given: No: Not felt to be a stroke  Modified Rankin: Rankin Score=0  Past Medical History  Diagnosis Date  . Anxiety   . HLD (hyperlipidemia)   . Depression   . HTN (hypertension)   . GERD (gastroesophageal reflux disease)   . Migraine   . Urinary frequency   . History of recurrent UTI (urinary tract infection)   . Perimenopausal atrophic vaginitis   . Dyspareunia   . Hypogammaglobulinemia (HCC)   . Chronic constipation   . Dysphagia   . History of chickenpox   . Colon polyp 09/2014    Past Surgical History  Procedure Laterality Date  . Abdominal hysterectomy  2015    Complete  . Abdominal hysterectomy    . Knee arthroscopy Bilateral 2011, 2015  . Appendectomy    . Tonsillectomy and adenoidectomy    . Cesarean section    . Endometrial ablation    . Esophagogastroduodenoscopy  09/2014  . Colonoscopy  09/2014    polyp removed    Family History  Problem Relation Age of Onset  . Hypertension Mother   . Hyperlipidemia Mother   . Diabetes Mother   . Nephrolithiasis Mother     Father  . Stroke Mother   . Heart disease Mother   . Colon polyps Mother   . Hypertension Father   . Hyperlipidemia Father   . Transient ischemic attack Father   . Deep vein  thrombosis    . Prostate cancer Paternal Grandfather    Social History:  reports that she has been smoking Cigarettes.  She has a 5 pack-year smoking history. She has never used smokeless tobacco. She reports that she drinks alcohol. She reports that she does not use illicit drugs.  Allergies:  Allergies  Allergen Reactions  . Oxycodone-Acetaminophen Other (See Comments)    Vomiting   . Other Other (See Comments)    Pt allergic to "All meats"    Medications: I have reviewed the patient's current medications. Prior to Admission:  Prior to Admission medications   Medication Sig Start Date End Date Taking? Authorizing Provider  ciprofloxacin (CIPRO) 500 MG tablet Take 1 tablet (500 mg total) by mouth 2 (two) times daily. Patient not taking: Reported on 08/24/2015 08/01/15   Earna Coder, MD  clonazePAM (KLONOPIN) 1 MG tablet Take 1 mg by mouth 2 (two) times daily.  10/01/13   Historical Provider, MD  esomeprazole (NEXIUM) 40 MG capsule Take 40 mg by mouth daily at 12 noon.    Historical Provider, MD  promethazine (PHENERGAN) 25 MG tablet Take 1 tablet (25 mg total) by mouth every 6 (six) hours as needed for nausea or vomiting. 08/24/15  Sharman Cheek, MD  ranitidine (ZANTAC) 150 MG capsule Take 1 capsule (150 mg total) by mouth 2 (two) times daily. 08/24/15   Sharman Cheek, MD  sertraline (ZOLOFT) 100 MG tablet Take 100 mg by mouth at bedtime.  09/27/13   Historical Provider, MD  sucralfate (CARAFATE) 1 g tablet Take 1 tablet (1 g total) by mouth 4 (four) times daily. 08/24/15   Sharman Cheek, MD    ROS: History obtained from the patient  General ROS: negative for - chills, fatigue, fever, night sweats, weight gain or weight loss Psychological ROS: negative for - behavioral disorder, hallucinations, memory difficulties, mood swings or suicidal ideation Ophthalmic ROS: negative for - blurry vision, double vision, eye pain or loss of vision ENT ROS: negative for - epistaxis,  nasal discharge, oral lesions, sore throat, tinnitus or vertigo Allergy and Immunology ROS: negative for - hives or itchy/watery eyes Hematological and Lymphatic ROS: negative for - bleeding problems, bruising or swollen lymph nodes Endocrine ROS: negative for - galactorrhea, hair pattern changes, polydipsia/polyuria or temperature intolerance Respiratory ROS: negative for - cough, hemoptysis, shortness of breath or wheezing Cardiovascular ROS: negative for - chest pain, dyspnea on exertion, edema or irregular heartbeat Gastrointestinal ROS: negative for - abdominal pain, diarrhea, hematemesis, nausea/vomiting or stool incontinence Genito-Urinary ROS: negative for - dysuria, hematuria, incontinence or urinary frequency/urgency Musculoskeletal ROS: negative for - joint swelling or muscular weakness Neurological ROS: as noted in HPI Dermatological ROS: negative for rash and skin lesion changes  Physical Examination: Blood pressure 154/91, pulse 58, temperature 98.2 F (36.8 C), temperature source Oral, resp. rate 18, height 5\' 4"  (1.626 m), weight 87.9 kg (193 lb 12.6 oz), SpO2 98 %.  HEENT-  Normocephalic, no lesions, without obvious abnormality.  Normal external eye and conjunctiva.  Normal TM's bilaterally.  Normal auditory canals and external ears. Normal external nose, mucus membranes and septum.  Normal pharynx. Cardiovascular- S1, S2 normal, pulses palpable throughout   Lungs- chest clear, no wheezing, rales, normal symmetric air entry Abdomen- soft, non-tender; bowel sounds normal; no masses,  no organomegaly Extremities- no edema Lymph-no adenopathy palpable Musculoskeletal-no joint tenderness, deformity or swelling Skin-warm and dry, no hyperpigmentation, vitiligo, or suspicious lesions  Neurological Examination Mental Status: Alert, oriented, thought content appropriate.  Speech fluent without evidence of aphasia.  Mild dysarthria.  Able to follow 3 step commands without  difficulty. Cranial Nerves: II: Discs flat bilaterally; Visual fields grossly normal, pupils equal, round, reactive to light and accommodation III,IV, VI: ptosis not present, extra-ocular motions intact bilaterally V,VII: smile symmetric, facial light touch sensation decreased on the right, splitting the midline VIII: hearing normal bilaterally IX,X: gag reflex present XI: bilateral shoulder shrug XII: midline tongue extension Motor: Right : Upper extremity   5/5    Left:     Upper extremity   5/5  Lower extremity   4/5     Lower extremity   5/5 Although patient with weakness on formal testing in the RLE when tested does not have reciprocal downward movement of the LLE with movement.   Sensory: Pinprick and light touch decreased on the right splitting the midline and most severe in the RLE Deep Tendon Reflexes: 2+ and symmetric with absent AJ's bilaterally Plantars: Right: downgoing   Left: downgoing Cerebellar: normal finger-to-nose and normal heel-to-shin testing bilaterally Gait: walks with a limp   Laboratory Studies:  Basic Metabolic Panel:  Recent Labs Lab 08/31/15 1133  NA 140  K 3.9  CL 108  CO2 25  GLUCOSE  96  BUN 10  CREATININE 0.69  CALCIUM 9.3    Liver Function Tests:  Recent Labs Lab 08/31/15 1133  AST 17  ALT 17  ALKPHOS 56  BILITOT 0.6  PROT 7.0  ALBUMIN 4.4   No results for input(s): LIPASE, AMYLASE in the last 168 hours. No results for input(s): AMMONIA in the last 168 hours.  CBC:  Recent Labs Lab 08/31/15 1133  WBC 5.6  NEUTROABS 2.8  HGB 13.8  HCT 40.3  MCV 90.7  PLT 147*    Cardiac Enzymes: No results for input(s): CKTOTAL, CKMB, CKMBINDEX, TROPONINI in the last 168 hours.  BNP: Invalid input(s): POCBNP  CBG:  Recent Labs Lab 08/31/15 1202  GLUCAP 94    Microbiology: Results for orders placed or performed in visit on 07/27/15  Urine culture     Status: None   Collection Time: 07/27/15 10:00 AM  Result Value Ref  Range Status   Specimen Description URINE, CLEAN CATCH  Final   Special Requests NONE  Final   Culture 80,000 COLONIES/ml ESCHERICHIA COLI  Final   Report Status 07/29/2015 FINAL  Final   Organism ID, Bacteria ESCHERICHIA COLI  Final      Susceptibility   Escherichia coli - MIC*    AMPICILLIN Value in next row Sensitive      SENSITIVE<=2    CEFAZOLIN Value in next row Sensitive      SENSITIVE<=4    CEFTRIAXONE Value in next row Sensitive      SENSITIVE<=1    CIPROFLOXACIN Value in next row Sensitive      SENSITIVE<=0.25    GENTAMICIN Value in next row Sensitive      SENSITIVE<=1    NITROFURANTOIN Value in next row Sensitive      SENSITIVE<=16    TRIMETH/SULFA Value in next row Sensitive      SENSITIVE<=20    PIP/TAZO Value in next row Sensitive      SENSITIVE<=4    LEVOFLOXACIN Value in next row Sensitive      SENSITIVE<=0.12    * 80,000 COLONIES/ml ESCHERICHIA COLI    Coagulation Studies: No results for input(s): LABPROT, INR in the last 72 hours.  Urinalysis: No results for input(s): COLORURINE, LABSPEC, PHURINE, GLUCOSEU, HGBUR, BILIRUBINUR, KETONESUR, PROTEINUR, UROBILINOGEN, NITRITE, LEUKOCYTESUR in the last 168 hours.  Invalid input(s): APPERANCEUR  Lipid Panel: No results found for: CHOL, TRIG, HDL, CHOLHDL, VLDL, LDLCALC  HgbA1C: No results found for: HGBA1C  Urine Drug Screen:      Component Value Date/Time   LABOPIA NONE DETECTED 05/28/2009 1133   COCAINSCRNUR NONE DETECTED 05/28/2009 1133   LABBENZ NONE DETECTED 05/28/2009 1133   AMPHETMU NONE DETECTED 05/28/2009 1133   THCU NONE DETECTED 05/28/2009 1133   LABBARB  05/28/2009 1133    NONE DETECTED        DRUG SCREEN FOR MEDICAL PURPOSES ONLY.  IF CONFIRMATION IS NEEDED FOR ANY PURPOSE, NOTIFY LAB WITHIN 5 DAYS.        LOWEST DETECTABLE LIMITS FOR URINE DRUG SCREEN Drug Class       Cutoff (ng/mL) Amphetamine      1000 Barbiturate      200 Benzodiazepine   200 Tricyclics       300 Opiates           300 Cocaine          300 THC              50    Alcohol Level: No results for input(s):  ETH in the last 168 hours.  Other results: EKG: sinus bradycardia at 57 bpm.  Imaging: Ct Head Wo Contrast  08/31/2015  CLINICAL DATA:  Right-sided weakness. Left temporal headache. Code stroke. EXAM: CT HEAD WITHOUT CONTRAST TECHNIQUE: Contiguous axial images were obtained from the base of the skull through the vertex without intravenous contrast. COMPARISON:  Brain MRI 09/01/2014 and CT 08/31/2014 FINDINGS: There is no evidence of acute cortical infarct, intracranial hemorrhage, mass, midline shift, or extra-axial fluid collection. Ventricles and sulci are normal. A few hypodensities in the cerebral white matter are unchanged and nonspecific. Visualized orbits are unremarkable. The visualized paranasal sinuses and mastoid air cells are clear. No skull lesion. IMPRESSION: No evidence of acute intracranial abnormality. These results were called by telephone at the time of interpretation on 08/31/2015 at 12:03 pm to Dr. Bobbye Morton , who verbally acknowledged these results. Electronically Signed   By: Sebastian Ache M.D.   On: 08/31/2015 12:05    Assessment: 40 y.o. female presenting with headache and right sided numbness and weakness.  Neurological examination has multiple functional aspects.  Suspect complicated migraine.  Head CT personally reviewed and shows no acute changes.  Initial NIHSS of 3.    Stroke Risk Factors - hyperlipidemia and hypertension  Plan: 1. MRI of the brain without contrast STAT to rule out stroke.  If unremarkable would treat headache.   Thana Farr, MD Neurology [phone] 08/31/2015, 12:47 PM  Addendum: DW sequences of MRI of the brain personally reviewed and show no acute changes.  Treatment for migraine to be initiated.  Code stroke cancelled.    Case discussed with Dr. Geanie Berlin, MD Neurology [phone]

## 2015-08-31 NOTE — ED Notes (Signed)
Pt had taken own IV out as RN went to remove it. 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

## 2015-08-31 NOTE — Discharge Instructions (Signed)
Take tylenol, motrin for headaches.   Take reglan for severe headaches.   Talk to your doctor about getting referral to neurologist.   Return to ER if you have severe headaches, vomiting, weakness, numbness, trouble speaking.

## 2015-08-31 NOTE — ED Notes (Signed)
EKG collected and read. Code Stroke called at 1142 Patient to CT at 1147 Labs not drawn - Janie notified. Patient alert and oriented.

## 2015-08-31 NOTE — ED Notes (Signed)
Patient presents complaining that she woke up this AM with a headache located left temporal area with "sharp stabbing pains like a knife".  Patient states she has migraines but this feels different.  Took . Ibuprofen with no relief.  At 1030 she noticed that the right side of her face did not feel right, lips feel numb, left arm and leg "heaviness".  States she was sitting when symptoms began. Alert and oriented.  Grip strength equal.  Smile slightly assymetrical.

## 2015-08-31 NOTE — ED Notes (Signed)
Patient transported to MRI 

## 2015-08-31 NOTE — ED Provider Notes (Signed)
CSN: [id number]     Arrival date & time 08/31/15  1109 History   First MD Initiated Contact with Patient 08/31/15 1208     Chief Complaint  Patient presents with  . Weakness    An emergency department physician performed an initial assessment on this suspected stroke patient at 1140. (Consider location/radiation/quality/duration/timing/severity/associated sxs/prior Treatment) The history is provided by the patient and a friend.  JANNETTE COTHAM is a 40 y.o. female hx of anxiety, depression, migraine here with headache, numbness. Patient has headaches for the last several days. She had worsening headache this morning in the left temple area. Around 10:30, she noticed some heaviness in her lips and had some trouble speaking. She also states that her right arm feels normal and slightly weak. Code stroke was activated and triage at 11:42. Patient examined by neurology prior to my exam. Had NIH scale of 3.     Past Medical History  Diagnosis Date  . Anxiety   . HLD (hyperlipidemia)   . Depression   . HTN (hypertension)   . GERD (gastroesophageal reflux disease)   . Migraine   . Urinary frequency   . History of recurrent UTI (urinary tract infection)   . Perimenopausal atrophic vaginitis   . Dyspareunia   . Hypogammaglobulinemia (HCC)   . Chronic constipation   . Dysphagia   . History of chickenpox   . Colon polyp 09/2014   Past Surgical History  Procedure Laterality Date  . Abdominal hysterectomy  2015    Complete  . Abdominal hysterectomy    . Knee arthroscopy Bilateral 2011, 2015  . Appendectomy    . Tonsillectomy and adenoidectomy    . Cesarean section    . Endometrial ablation    . Esophagogastroduodenoscopy  09/2014  . Colonoscopy  09/2014    polyp removed   Family History  Problem Relation Age of Onset  . Hypertension Mother   . Hyperlipidemia Mother   . Diabetes Mother   . Nephrolithiasis Mother     Father  . Stroke Mother   . Heart disease Mother   . Colon  polyps Mother   . Hypertension Father   . Hyperlipidemia Father   . Transient ischemic attack Father   . Deep vein thrombosis    . Prostate cancer Paternal Grandfather    Social History  Substance Use Topics  . Smoking status: Current Every Day Smoker -- 0.25 packs/day for 20 years    Types: Cigarettes  . Smokeless tobacco: Never Used  . Alcohol Use: 0.0 oz/week    0 Standard drinks or equivalent per week     Comment: occasional wine an dliquor   OB History    No data available     Review of Systems  Neurological: Positive for weakness, numbness and headaches.  All other systems reviewed and are negative.     Allergies  Oxycodone-acetaminophen and Other  Home Medications   Prior to Admission medications   Medication Sig Start Date End Date Taking? Authorizing Provider  ciprofloxacin (CIPRO) 500 MG tablet Take 1 tablet (500 mg total) by mouth 2 (two) times daily. Patient not taking: Reported on 08/24/2015 08/01/15   Earna Coder, MD  clonazePAM (KLONOPIN) 1 MG tablet Take 1 mg by mouth 2 (two) times daily.  10/01/13   Historical Provider, MD  esomeprazole (NEXIUM) 40 MG capsule Take 40 mg by mouth daily at 12 noon.    Historical Provider, MD  promethazine (PHENERGAN) 25 MG tablet Take 1 tablet (25  mg total) by mouth every 6 (six) hours as needed for nausea or vomiting. 08/24/15   Sharman Cheek, MD  ranitidine (ZANTAC) 150 MG capsule Take 1 capsule (150 mg total) by mouth 2 (two) times daily. 08/24/15   Sharman Cheek, MD  sertraline (ZOLOFT) 100 MG tablet Take 100 mg by mouth at bedtime.  09/27/13   Historical Provider, MD  sucralfate (CARAFATE) 1 g tablet Take 1 tablet (1 g total) by mouth 4 (four) times daily. 08/24/15   Sharman Cheek, MD   BP 154/91 mmHg  Pulse 58  Temp(Src) 98.2 F (36.8 C) (Oral)  Resp 18  Ht  (1.626 m)  Wt 193 lb 12.6 oz (87.9 kg)  BMI 33.25 kg/m2  SpO2 98% Physical Exam  Constitutional: She is oriented to person, place, and  time.  Uncomfortable   HENT:  Head: Normocephalic.  Mouth/Throat: Oropharynx is clear and moist.  Eyes: Conjunctivae are normal. Pupils are equal, round, and reactive to light.  Neck: Normal range of motion. Neck supple.  Cardiovascular: Normal rate, regular rhythm and normal heart sounds.   Pulmonary/Chest: Effort normal and breath sounds normal. No respiratory distress. She has no wheezes. She has no rales.  Abdominal: Soft. Bowel sounds are normal. She exhibits no distension. There is no tenderness. There is no rebound.  Musculoskeletal: Normal range of motion. She exhibits no edema or tenderness.  Neurological: She is alert and oriented to person, place, and time.  Dec sensation R face and arm and leg, Strength 4/5 R side compared to 5/5 L side but distractible   Skin: Skin is warm and dry.  Psychiatric: She has a normal mood and affect. Her behavior is normal. Judgment and thought content normal.  Nursing note and vitals reviewed.   ED Course  Procedures (including critical care time) Labs Review Labs Reviewed  CBC - Abnormal; Notable for the following:    Platelets 147 (*)    All other components within normal limits  APTT  DIFFERENTIAL  COMPREHENSIVE METABOLIC PANEL  GLUCOSE, CAPILLARY  CBG MONITORING, ED  I-STAT CHEM 8, ED    Imaging Review Ct Head Wo Contrast  08/31/2015  CLINICAL DATA:  Right-sided weakness. Left temporal headache. Code stroke. EXAM: CT HEAD WITHOUT CONTRAST TECHNIQUE: Contiguous axial images were obtained from the base of the skull through the vertex without intravenous contrast. COMPARISON:  Brain MRI 09/01/2014 and CT 08/31/2014 FINDINGS: There is no evidence of acute cortical infarct, intracranial hemorrhage, mass, midline shift, or extra-axial fluid collection. Ventricles and sulci are normal. A few hypodensities in the cerebral white matter are unchanged and nonspecific. Visualized orbits are unremarkable. The visualized paranasal sinuses and  mastoid air cells are clear. No skull lesion. IMPRESSION: No evidence of acute intracranial abnormality. These results were called by telephone at the time of interpretation on 08/31/2015 at 12:03 pm to Dr. Bobbye Morton , who verbally acknowledged these results. Electronically Signed   By: Sebastian Ache M.D.   On: 08/31/2015 12:05   Mr Brain Wo Contrast  08/31/2015  CLINICAL DATA:  Right-sided weakness EXAM: MRI HEAD WITHOUT CONTRAST TECHNIQUE: Multiplanar, multiecho pulse sequences of the brain and surrounding structures were obtained without intravenous contrast. COMPARISON:  MRI 09/01/2014 FINDINGS: Negative for acute infarct. 6 mm hyperintensity in the right centrum semiovale is unchanged from the prior MRI. Small hyperintensity in the right frontal temporal white matter is unchanged. Dilated perivascular space in the left parietal white matter unchanged. No new white matter lesions. Brainstem and cerebellum normal. Negative  for mass or edema new. Negative for intracranial hemorrhage. Ventricle size normal. Cerebral volume normal. No shift of the midline structures. Mucosal edema in the right maxillary sinus. No air-fluid level. Normal orbit. Pituitary normal in size. IMPRESSION: No acute abnormality Mild white matter changes in the right frontal lobe similar to the prior MRI 1 year ago. These are nonspecific and may be related to chronic microvascular ischemia or migraine headaches. Demyelinating disease not considered likely but correlate with symptoms. Electronically Signed   By: Marlan Palau M.D.   On: 08/31/2015 13:01   I have personally reviewed and evaluated these images and lab results as part of my medical decision-making.   EKG Interpretation None       ED ECG REPORT I, Lasaundra Riche, the attending physician, personally viewed and interpreted this ECG.   Date: 08/31/2015  EKG Time: 11:38 am  Rate: 57  Rhythm: normal EKG, normal sinus rhythm  Axis: normal  Intervals:none  ST&T Change:  nonspecific    MDM   Final diagnoses:  Right sided weakness    BECKY COLAN is a 40 y.o. female here with headache, R sided numbness and heaviness. Neuro at bedside. NIH 3. Consider complex migraine vs stroke. Will get CT head, MRI brain.  2:05 PM MRI brain unremarkable. Given migraine cocktail now sleepy. Headache improved.   2:43 PM Patient ambulated. Felt better. Has frequent headaches but has no insurance. Will give reglan prn. Recommend that she sees PCP to refer to a neurologist to help control headaches.       Richardean Canal, MD 08/31/15 1444

## 2015-08-31 NOTE — Progress Notes (Signed)
Prayer provided as patient was being attended. Follow up needed.

## 2015-08-31 NOTE — ED Notes (Signed)
Patient ambulated in room.  Ambulates with easy and steady gait.  Posture relaxed and upright.  MOving all extremities equally and strong.  Patient tolerated well.

## 2015-08-31 NOTE — ED Notes (Signed)
Patient to ED with complaint of left sided headache, sudden onset, sharp pain. Complaining of right-sided weakness. LKW 1030 this morning.

## 2015-12-22 ENCOUNTER — Emergency Department
Admission: EM | Admit: 2015-12-22 | Discharge: 2015-12-22 | Disposition: A | Discharge status: Home / Self Care | Payer: Self-pay | Attending: Emergency Medicine | Admitting: Emergency Medicine

## 2015-12-22 ENCOUNTER — Encounter: Payer: Self-pay | Admitting: Emergency Medicine

## 2015-12-22 ENCOUNTER — Emergency Department: Payer: Self-pay

## 2015-12-22 DIAGNOSIS — S8991XA Unspecified injury of right lower leg, initial encounter: Secondary | ICD-10-CM | POA: Insufficient documentation

## 2015-12-22 DIAGNOSIS — Y999 Unspecified external cause status: Secondary | ICD-10-CM | POA: Insufficient documentation

## 2015-12-22 DIAGNOSIS — F17210 Nicotine dependence, cigarettes, uncomplicated: Secondary | ICD-10-CM | POA: Insufficient documentation

## 2015-12-22 DIAGNOSIS — F329 Major depressive disorder, single episode, unspecified: Secondary | ICD-10-CM | POA: Insufficient documentation

## 2015-12-22 DIAGNOSIS — E785 Hyperlipidemia, unspecified: Secondary | ICD-10-CM | POA: Insufficient documentation

## 2015-12-22 DIAGNOSIS — Z79899 Other long term (current) drug therapy: Secondary | ICD-10-CM | POA: Insufficient documentation

## 2015-12-22 DIAGNOSIS — Y9389 Activity, other specified: Secondary | ICD-10-CM | POA: Insufficient documentation

## 2015-12-22 DIAGNOSIS — Z792 Long term (current) use of antibiotics: Secondary | ICD-10-CM | POA: Insufficient documentation

## 2015-12-22 DIAGNOSIS — Y929 Unspecified place or not applicable: Secondary | ICD-10-CM | POA: Insufficient documentation

## 2015-12-22 DIAGNOSIS — I10 Essential (primary) hypertension: Secondary | ICD-10-CM | POA: Insufficient documentation

## 2015-12-22 DIAGNOSIS — X509XXA Other and unspecified overexertion or strenuous movements or postures, initial encounter: Secondary | ICD-10-CM | POA: Insufficient documentation

## 2015-12-22 MED ORDER — HYDROCODONE-ACETAMINOPHEN 5-325 MG PO TABS
1.0000 | ORAL_TABLET | Freq: Once | ORAL | Status: AC
  Administered 2015-12-22: 1 via ORAL

## 2015-12-22 MED ORDER — HYDROCODONE-ACETAMINOPHEN 5-325 MG PO TABS: 1.0000 | ORAL_TABLET | ORAL | Status: DC | PRN

## 2015-12-22 MED ORDER — MELOXICAM 15 MG PO TABS
15.0000 mg | ORAL_TABLET | Freq: Every day | ORAL | Status: DC
Start: 2015-12-22 — End: 2017-01-17

## 2015-12-22 NOTE — ED Provider Notes (Signed)
[facility] Emergency Department Provider Note  ____________________________________________  Time seen: Approximately 3:13 PM  I have reviewed the triage vital signs and the nursing notes.   HISTORY  Chief Complaint Knee Pain    HPI Amber Campos is a 40 y.o. female who presents to emergency department complaining of right knee pain. Patient states that last night she was coming down a staircase when she planted her right leg and felt a pop. Patient states that her knee buckled and she has been having pain to the medial aspect since time of the event. Patient states the pain is severe, unrelieved by over-the-counter medications, and she is unable to bear weight on the affected knee. Patient denies any numbness or tingling distal to injury site. She did not fall or hit her head. Patient had a history of knee surgery to that knee "many years ago." Patient states at that time she had "some cartilage cleaned up."   Past Medical History  Diagnosis Date  . Anxiety   . HLD (hyperlipidemia)   . Depression   . HTN (hypertension)   . GERD (gastroesophageal reflux disease)   . Migraine   . Urinary frequency   . History of recurrent UTI (urinary tract infection)   . Perimenopausal atrophic vaginitis   . Dyspareunia   . Hypogammaglobulinemia (HCC)   . Chronic constipation   . Dysphagia   . History of chickenpox   . Colon polyp 09/2014    Patient Active Problem List   Diagnosis Date Noted  . 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 CONTUSION OF BUTTOCK 08/10/2009  . HEAD TRAUMA, CLOSED 08/10/2009  . CHEST WALL PAIN, ACUTE 08/08/2009  . SHORTNESS OF BREATH 06/05/2009  . CHEST PAIN 05/15/2009  . ABDOMINAL PAIN OTHER SPECIFIED SITE 05/09/2009  . SINUSITIS- ACUTE-NOS 08/17/2008  . URI 06/23/2008  . TOBACCO USE 05/30/2008  . Depressive disorder, not elsewhere classified 05/30/2008  . ALLERGIC RHINITIS 05/30/2008  . GERD 05/30/2008  . HEADACHE 05/30/2008     Past Surgical History  Procedure Laterality Date  . Abdominal hysterectomy  2015    Complete  . Abdominal hysterectomy    . Knee arthroscopy Bilateral 2011, 2015  . Appendectomy    . Tonsillectomy and adenoidectomy    . Cesarean section    . Endometrial ablation    . Esophagogastroduodenoscopy  09/2014  . Colonoscopy  09/2014    polyp removed    Current Outpatient Rx  Name  Route  Sig  Dispense  Refill  . ciprofloxacin (CIPRO) 500 MG tablet   Oral   Take 1 tablet (500 mg total) by mouth 2 (two) times daily. Patient not taking: Reported on 08/24/2015   10 tablet   0   . clonazePAM (KLONOPIN) 1 MG tablet   Oral   Take 1 mg by mouth 2 (two) times daily.          Marland Kitchen esomeprazole (NEXIUM) 40 MG capsule   Oral   Take 40 mg by mouth daily at 12 noon.         Marland Kitchen HYDROcodone-acetaminophen (NORCO/VICODIN) 5-325 MG tablet   Oral   Take 1 tablet by mouth every 4 (four) hours as needed for moderate pain.   20 tablet   0   . meloxicam (MOBIC) 15 MG tablet   Oral   Take 1 tablet (15 mg total) by mouth daily.   30 tablet   0   . metoCLOPramide (REGLAN) 10 MG tablet   Oral   Take  1 tablet (10 mg total) by mouth every 6 (six) hours as needed for nausea (nausea/headache).   6 tablet   0   . promethazine (PHENERGAN) 25 MG tablet   Oral   Take 1 tablet (25 mg total) by mouth every 6 (six) hours as needed for nausea or vomiting. Patient not taking: Reported on 08/31/2015   15 tablet   0   . ranitidine (ZANTAC) 150 MG capsule   Oral   Take 1 capsule (150 mg total) by mouth 2 (two) times daily. Patient not taking: Reported on 08/31/2015   28 capsule   0   . sertraline (ZOLOFT) 100 MG tablet   Oral   Take 100 mg by mouth at bedtime.          . sucralfate (CARAFATE) 1 g tablet   Oral   Take 1 tablet (1 g total) by mouth 4 (four) times daily. Patient not taking: Reported on 08/31/2015   120 tablet   1     Allergies Oxycodone-acetaminophen and Other  Family  History  Problem Relation Age of Onset  . Hypertension Mother   . Hyperlipidemia Mother   . Diabetes Mother   . Nephrolithiasis Mother     Father  . Stroke Mother   . Heart disease Mother   . Colon polyps Mother   . Hypertension Father   . Hyperlipidemia Father   . Transient ischemic attack Father   . Deep vein thrombosis    . Prostate cancer Paternal Grandfather     Social History Social History  Substance Use Topics  . Smoking status: Current Every Day Smoker -- 0.25 packs/day for 20 years    Types: Cigarettes  . Smokeless tobacco: Never Used  . Alcohol Use: 0.0 oz/week    0 Standard drinks or equivalent per week     Comment: occasional wine an dliquor     Review of Systems  Constitutional: No fever/chills Cardiovascular: no chest pain. Respiratory: no cough. No SOB. Musculoskeletal: Positive for right knee pain Skin: Negative for rash, abrasions, lacerations, ecchymosis. Neurological: Negative for headaches, focal weakness or numbness. 10-point ROS otherwise negative.  ____________________________________________   PHYSICAL EXAM:  VITAL SIGNS: ED Triage Vitals  Enc Vitals Group     BP 12/22/15 1350 113/82 mmHg     Pulse Rate 12/22/15 1350 64     Resp 12/22/15 1350 20     Temp 12/22/15 1350 98.1 F (36.7 C)     Temp Source 12/22/15 1350 Oral     SpO2 12/22/15 1350 97 %     Weight 12/22/15 1350 185 lb (83.915 kg)     Height 12/22/15 1350 5\' 4"  (1.626 m)     Head Cir --      Peak Flow --      Pain Score 12/22/15 1346 9     Pain Loc --      Pain Edu? --      Excl. in GC? --      Constitutional: Alert and oriented. Well appearing and in no acute distress. Eyes: Conjunctivae are normal. PERRL. EOMI. Head: Atraumatic. Cardiovascular: Normal rate, regular rhythm. Normal S1 and S2.  Good peripheral circulation. Respiratory: Normal respiratory effort without tachypnea or retractions. Lungs CTAB. Good air entry to the bases with no decreased or absent  breath sounds. Musculoskeletal: No gross edema or abnormality is noted to right knee upon inspection. Patient has limited range of motion due to pain. Patient is tender to palpation over the medial aspect  of the knee as well as the medial joint line. No palpable abnormality. Lachman's is negative. Mild laxity is noted with valgus stress testing. No laxity noted with varus stress testing. McMurray's is positive for medial joint pain. Dorsalis pulses intact distally. Sensation intact and equal unaffected extremity. Neurologic:  Normal speech and language. No gross focal neurologic deficits are appreciated.  Skin:  Skin is warm, dry and intact. No rash noted. Psychiatric: Mood and affect are normal. Speech and behavior are normal. Patient exhibits appropriate insight and judgement.   ____________________________________________   LABS (all labs ordered are listed, but only abnormal results are displayed)  Labs Reviewed - No data to display ____________________________________________  EKG   ____________________________________________  RADIOLOGY Festus Barren Cuthriell, personally viewed and evaluated these images (plain radiographs) as part of my medical decision making, as well as reviewing the written report by the radiologist.  Dg Knee Complete 4 Views Right  12/22/2015  CLINICAL DATA:  Right knee pain for 2 days. Personal history of right knee surgery with re-injury 2 days ago. EXAM: RIGHT KNEE - COMPLETE 4+ VIEW COMPARISON:  None. FINDINGS: There is no evidence of fracture, dislocation, or joint effusion. There is no evidence of arthropathy or other focal bone abnormality. Soft tissues are unremarkable. IMPRESSION: Negative right knee radiographs. Electronically Signed   By: Marin Roberts M.D.   On: 12/22/2015 15:02    ____________________________________________    PROCEDURES  Procedure(s) performed:       Medications  HYDROcodone-acetaminophen (NORCO/VICODIN) 5-325  MG per tablet 1 tablet (not administered)     ____________________________________________   INITIAL IMPRESSION / ASSESSMENT AND PLAN / ED COURSE  Pertinent labs & imaging results that were available during my care of the patient were reviewed by me and considered in my medical decision making (see chart for details).  Patient's diagnosis is consistent with left knee injury. X-ray reveals no acute osseous abnormality. Exam does reveal some laxity with valgus stress testing as well as positive McMurray's test. At this time patient's knee is immobilized using a knee immobilizer and she is given crutches for ambulation. She'll be prescribed anti-inflammatories and pain medication. She will follow up with orthopedics for further evaluation and management..  Patient is given ED precautions to return to the ED for any worsening or new symptoms.     ____________________________________________  FINAL CLINICAL IMPRESSION(S) / ED DIAGNOSES  Final diagnoses:  Right knee injury, initial encounter      NEW MEDICATIONS STARTED DURING THIS VISIT:  New Prescriptions   HYDROCODONE-ACETAMINOPHEN (NORCO/VICODIN) 5-325 MG TABLET    Take 1 tablet by mouth every 4 (four) hours as needed for moderate pain.   MELOXICAM (MOBIC) 15 MG TABLET    Take 1 tablet (15 mg total) by mouth daily.        This chart was dictated using voice recognition software/Dragon. Despite best efforts to proofread, errors can occur which can change the meaning. Any change was purely unintentional.    Racheal Patches, PA-C 12/22/15 1524  Jene Every, MD 12/22/15 Ernestina [place]

## 2015-12-22 NOTE — ED Notes (Signed)
Reports hearing a pop in right knee last night.  C/o pain.  NAD

## 2015-12-22 NOTE — Discharge Instructions (Signed)

## 2015-12-22 NOTE — ED Notes (Signed)
Having pain to right knee since yesterday  States she was coming down steps and heard a "pop" having pain to lateral and post right knee. States she is unable to bear full wt

## 2016-01-25 ENCOUNTER — Inpatient Hospital Stay: Payer: Self-pay

## 2016-01-25 ENCOUNTER — Inpatient Hospital Stay: Payer: Self-pay | Admitting: Internal Medicine

## 2016-02-09 ENCOUNTER — Inpatient Hospital Stay: Payer: Self-pay | Admitting: Oncology

## 2016-02-09 ENCOUNTER — Inpatient Hospital Stay: Payer: Self-pay

## 2016-08-26 ENCOUNTER — Encounter: Payer: Self-pay | Admitting: Emergency Medicine

## 2016-08-26 ENCOUNTER — Emergency Department
Admission: EM | Admit: 2016-08-26 | Discharge: 2016-08-26 | Disposition: A | Discharge status: Home / Self Care | Payer: Self-pay | Attending: Emergency Medicine | Admitting: Emergency Medicine

## 2016-08-26 ENCOUNTER — Emergency Department: Payer: Self-pay

## 2016-08-26 DIAGNOSIS — R1011 Right upper quadrant pain: Secondary | ICD-10-CM

## 2016-08-26 DIAGNOSIS — R1013 Epigastric pain: Secondary | ICD-10-CM | POA: Insufficient documentation

## 2016-08-26 DIAGNOSIS — R1012 Left upper quadrant pain: Secondary | ICD-10-CM | POA: Insufficient documentation

## 2016-08-26 DIAGNOSIS — I10 Essential (primary) hypertension: Secondary | ICD-10-CM | POA: Insufficient documentation

## 2016-08-26 DIAGNOSIS — F17210 Nicotine dependence, cigarettes, uncomplicated: Secondary | ICD-10-CM | POA: Insufficient documentation

## 2016-08-26 DIAGNOSIS — R1010 Upper abdominal pain, unspecified: Secondary | ICD-10-CM

## 2016-08-26 DIAGNOSIS — Z79899 Other long term (current) drug therapy: Secondary | ICD-10-CM | POA: Insufficient documentation

## 2016-08-26 LAB — CBC
HCT: 39.8 % (ref 35.0–47.0)
HEMOGLOBIN: 14.3 g/dL (ref 12.0–15.0)
MCH: 32.2 pg (ref 26.0–34.0)
MCHC: 36 g/dL (ref 32.0–36.0)
MCV: 89.5 fL (ref 80.0–100.0)
PLATELETS: 161 10*3/uL (ref 150–440)
RBC: 4.45 MIL/uL (ref 3.80–5.20)
RDW: 12.7 % (ref 11.5–14.5)
WBC: 7.4 10*3/uL (ref 3.6–11.0)

## 2016-08-26 LAB — COMPREHENSIVE METABOLIC PANEL
ALK PHOS: 56 U/L (ref 38–126)
ALT: 15 U/L (ref 14–54)
ANION GAP: 6 (ref 5–15)
AST: 22 U/L (ref 15–41)
Albumin: 4.5 g/dL (ref 3.5–5.0)
BUN: 7 mg/dL (ref 6–20)
CALCIUM: 9.4 mg/dL (ref 8.9–10.3)
CO2: 24 mmol/L (ref 22–32)
Chloride: 108 mmol/L (ref 101–111)
Creatinine, Ser: 0.6 mg/dL (ref 0.44–1.00)
Glucose, Bld: 94 mg/dL (ref 65–99)
Potassium: 4.1 mmol/L (ref 3.5–5.1)
Sodium: 138 mmol/L (ref 135–145)
TOTAL PROTEIN: 7.2 g/dL (ref 6.5–8.1)
Total Bilirubin: 0.5 mg/dL (ref 0.3–1.2)

## 2016-08-26 LAB — URINALYSIS, COMPLETE (UACMP) WITH MICROSCOPIC
BILIRUBIN URINE: NEGATIVE
Bacteria, UA: NONE SEEN
GLUCOSE, UA: NEGATIVE mg/dL
HGB URINE DIPSTICK: NEGATIVE
KETONES UR: NEGATIVE mg/dL
NITRITE: NEGATIVE
PH: 5 (ref 5.0–8.0)
PROTEIN: NEGATIVE mg/dL
Specific Gravity, Urine: 1.018 (ref 1.005–1.030)

## 2016-08-26 LAB — LIPASE, BLOOD: Lipase: 32 U/L (ref 11–51)

## 2016-08-26 MED ORDER — FAMOTIDINE 20 MG PO TABS: 20.0000 mg | ORAL_TABLET | Freq: Two times a day (BID) | ORAL | 0 refills | Status: DC

## 2016-08-26 MED ORDER — FAMOTIDINE 20 MG PO TABS
40.0000 mg | ORAL_TABLET | Freq: Once | ORAL | Status: AC
  Administered 2016-08-26: 40 mg via ORAL
  Filled 2016-08-26: qty 2

## 2016-08-26 MED ORDER — METOCLOPRAMIDE HCL 10 MG PO TABS: 10.0000 mg | ORAL_TABLET | Freq: Four times a day (QID) | ORAL | 0 refills | Status: DC | PRN

## 2016-08-26 MED ORDER — GI COCKTAIL ~~LOC~~
30.0000 mL | ORAL | Status: AC
  Administered 2016-08-26: 30 mL via ORAL
  Filled 2016-08-26: qty 30

## 2016-08-26 MED ORDER — CALCIUM CARBONATE ANTACID 500 MG PO CHEW: 2.0000 | CHEWABLE_TABLET | Freq: Three times a day (TID) | ORAL | 0 refills | Status: DC | PRN

## 2016-08-26 NOTE — ED Provider Notes (Signed)
[facility] Emergency Department Provider Note  ____________________________________________  Time seen: Approximately 5:27 PM  I have reviewed the triage vital signs and the nursing notes.   HISTORY  Chief Complaint Abdominal Pain    HPI Amber Campos is a 41 y.o. female who complains of diffuse upper abdominal pain. The past 2 days. Constant waxing and waning. Nonradiating. No aggravating or alleviating factors. No chest pain shortness of breath vomiting diarrhea fevers or chills. Never had anything like this before. Takes omeprazole daily for GERD. No trauma. No dysuria.Pain is moderate severity.     Past Medical History:  Diagnosis Date  . Anxiety   . Chronic constipation   . Colon polyp 09/2014  . Depression   . Dyspareunia   . Dysphagia   . GERD (gastroesophageal reflux disease)   . History of chickenpox   . History of recurrent UTI (urinary tract infection)   . HLD (hyperlipidemia)   . HTN (hypertension)   . Hypogammaglobulinemia (HCC)   . Migraine   . Perimenopausal atrophic vaginitis   . Urinary frequency      Patient Active Problem List   Diagnosis Date Noted  . 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 CONTUSION OF BUTTOCK 08/10/2009  . HEAD TRAUMA, CLOSED 08/10/2009  . CHEST WALL PAIN, ACUTE 08/08/2009  . SHORTNESS OF BREATH 06/05/2009  . CHEST PAIN 05/15/2009  . ABDOMINAL PAIN OTHER SPECIFIED SITE 05/09/2009  . SINUSITIS- ACUTE-NOS 08/17/2008  . URI 06/23/2008  . TOBACCO USE 05/30/2008  . Depressive disorder, not elsewhere classified 05/30/2008  . ALLERGIC RHINITIS 05/30/2008  . GERD 05/30/2008  . HEADACHE 05/30/2008     Past Surgical History:  Procedure Laterality Date  . ABDOMINAL HYSTERECTOMY  2015   Complete  . ABDOMINAL HYSTERECTOMY    . APPENDECTOMY    . CESAREAN SECTION    . COLONOSCOPY  09/2014   polyp removed  . ENDOMETRIAL ABLATION    . ESOPHAGOGASTRODUODENOSCOPY  09/2014  . KNEE ARTHROSCOPY Bilateral  2011, 2015  . TONSILLECTOMY AND ADENOIDECTOMY       Prior to Admission medications   Medication Sig Start Date End Date Taking? Authorizing Provider  calcium carbonate (TUMS) 500 MG chewable tablet Chew 2 tablets (400 mg of elemental calcium total) by mouth 3 (three) times daily as needed for indigestion or heartburn. 08/26/16 08/26/17  Sharman Cheek, MD  ciprofloxacin (CIPRO) 500 MG tablet Take 1 tablet (500 mg total) by mouth 2 (two) times daily. Patient not taking: Reported on 08/24/2015 08/01/15   Earna Coder, MD  clonazePAM (KLONOPIN) 1 MG tablet Take 1 mg by mouth 2 (two) times daily.  10/01/13   Historical Provider, MD  esomeprazole (NEXIUM) 40 MG capsule Take 40 mg by mouth daily at 12 noon.    Historical Provider, MD  famotidine (PEPCID) 20 MG tablet Take 1 tablet (20 mg total) by mouth 2 (two) times daily. 08/26/16   Sharman Cheek, MD  HYDROcodone-acetaminophen (NORCO/VICODIN) 5-325 MG tablet Take 1 tablet by mouth every 4 (four) hours as needed for moderate pain. 12/22/15   Delorise Royals Cuthriell, PA-C  meloxicam (MOBIC) 15 MG tablet Take 1 tablet (15 mg total) by mouth daily. 12/22/15   Delorise Royals Cuthriell, PA-C  metoCLOPramide (REGLAN) 10 MG tablet Take 1 tablet (10 mg total) by mouth every 6 (six) hours as needed. 08/26/16   Sharman Cheek, MD  promethazine (PHENERGAN) 25 MG tablet Take 1 tablet (25 mg total) by mouth every 6 (six) hours as needed for nausea or  vomiting. Patient not taking: Reported on 08/31/2015 08/24/15   Sharman CheekPhillip Brysin Towery, MD  ranitidine (ZANTAC) 150 MG capsule Take 1 capsule (150 mg total) by mouth 2 (two) times daily. Patient not taking: Reported on 08/31/2015 08/24/15   Sharman CheekPhillip Joella Saefong, MD  sertraline (ZOLOFT) 100 MG tablet Take 100 mg by mouth at bedtime.  09/27/13   Historical Provider, MD  sucralfate (CARAFATE) 1 g tablet Take 1 tablet (1 g total) by mouth 4 (four) times daily. Patient not taking: Reported on 08/31/2015 08/24/15   Sharman CheekPhillip Davelle Anselmi, MD      Allergies Oxycodone-acetaminophen and Other   Family History  Problem Relation Age of Onset  . Hypertension Mother   . Hyperlipidemia Mother   . Diabetes Mother   . Nephrolithiasis Mother     Father  . Stroke Mother   . Heart disease Mother   . Colon polyps Mother   . Hypertension Father   . Hyperlipidemia Father   . Transient ischemic attack Father   . Prostate cancer Paternal Grandfather   . Deep vein thrombosis      Social History Social History  Substance Use Topics  . Smoking status: Current Every Day Smoker    Packs/day: 0.25    Years: 20.00    Types: Cigarettes  . Smokeless tobacco: Never Used  . Alcohol use 0.0 oz/week     Comment: occasional wine an dliquor    Review of Systems  Constitutional:   No fever or chills.  ENT:   No sore throat. No rhinorrhea. Cardiovascular:   No chest pain. Respiratory:   No dyspnea or cough. Gastrointestinal:   Positive as above for abdominal pain without, vomiting and diarrhea.  Genitourinary:   Negative for dysuria or difficulty urinating. Musculoskeletal:   Negative for focal pain or swelling Neurological:   Negative for headaches 10-point ROS otherwise negative.  ____________________________________________   PHYSICAL EXAM:  VITAL SIGNS: ED Triage Vitals  Enc Vitals Group     BP 08/26/16 1540 131/86     Pulse Rate 08/26/16 1540 74     Resp 08/26/16 1540 18     Temp 08/26/16 1540 98.1 F (36.7 C)     Temp Source 08/26/16 1540 Oral     SpO2 08/26/16 1540 95 %     Weight 08/26/16 1543 194 lb (88 kg)     Height 08/26/16 1543 5\' 4"  (1.626 m)     Head Circumference --      Peak Flow --      Pain Score 08/26/16 1544 8     Pain Loc --      Pain Edu? --      Excl. in GC? --     Vital signs reviewed, nursing assessments reviewed.   Constitutional:   Alert and oriented. Well appearing and in no distress. Eyes:   No scleral icterus. No conjunctival pallor. PERRL. EOMI.  No nystagmus. ENT   Head:    Normocephalic and atraumatic.   Nose:   No congestion/rhinnorhea. No septal hematoma   Mouth/Throat:   MMM, no pharyngeal erythema. No peritonsillar mass.    Neck:   No stridor. No SubQ emphysema. No meningismus. Hematological/Lymphatic/Immunilogical:   No cervical lymphadenopathy. Cardiovascular:   RRR. Symmetric bilateral radial and DP pulses.  No murmurs.  Respiratory:   Normal respiratory effort without tachypnea nor retractions. Breath sounds are clear and equal bilaterally. No wheezes/rales/rhonchi. Gastrointestinal:   Soft With left upper quadrant and epigastric tenderness. Non distended. There is no CVA tenderness.  No  rebound, rigidity, or guarding. Genitourinary:   deferred Musculoskeletal:   Nontender with normal range of motion in all extremities. No joint effusions.  No lower extremity tenderness.  No edema. Neurologic:   Normal speech and language.  CN 2-10 normal. Motor grossly intact. No gross focal neurologic deficits are appreciated.  Skin:    Skin is warm, dry and intact. No rash noted.  No petechiae, purpura, or bullae.  ____________________________________________    LABS (pertinent positives/negatives) (all labs ordered are listed, but only abnormal results are displayed) Labs Reviewed  URINALYSIS, COMPLETE (UACMP) WITH MICROSCOPIC - Abnormal; Notable for the following:       Result Value   Color, Urine YELLOW (*)    APPearance CLEAR (*)    Leukocytes, UA TRACE (*)    Squamous Epithelial / LPF 0-5 (*)    All other components within normal limits  LIPASE, BLOOD  COMPREHENSIVE METABOLIC PANEL  CBC   ____________________________________________   EKG  Interpreted by me  Date: 08/26/2016  Rate: 69  Rhythm: normal sinus rhythm  QRS Axis: normal  Intervals: normal  ST/T Wave abnormalities: normal  Conduction Disutrbances: none  Narrative Interpretation: unremarkable      ____________________________________________     RADIOLOGY  Ultrasound right upper quadrant unremarkable  ____________________________________________   PROCEDURES Procedures  ____________________________________________   INITIAL IMPRESSION / ASSESSMENT AND PLAN / ED COURSE  Pertinent labs & imaging results that were available during my care of the patient were reviewed by me and considered in my medical decision making (see chart for details).  She well-appearing no acute distress, presents with nonspecific upper abdominal pain. Labs and ultrasound all unremarkable. EKG unremarkable, vital signs normal. Exam most likely consistent with gastritis. Despite her PI use. She does take NSAIDs that may be aggravating her GERD. I'll have her take Tums Reglan and famotidine as well for a week, follow up with primary care.Considering the patient's symptoms, medical history, and physical examination today, I have low suspicion for cholecystitis or biliary pathology, pancreatitis, perforation or bowel obstruction, hernia, intra-abdominal abscess, AAA or dissection, volvulus or intussusception, mesenteric ischemia, or appendicitis.       Clinical Course    ____________________________________________   FINAL CLINICAL IMPRESSION(S) / ED DIAGNOSES  Final diagnoses:  Pain of upper abdomen      New Prescriptions   CALCIUM CARBONATE (TUMS) 500 MG CHEWABLE TABLET    Chew 2 tablets (400 mg of elemental calcium total) by mouth 3 (three) times daily as needed for indigestion or heartburn.   FAMOTIDINE (PEPCID) 20 MG TABLET    Take 1 tablet (20 mg total) by mouth 2 (two) times daily.   METOCLOPRAMIDE (REGLAN) 10 MG TABLET    Take 1 tablet (10 mg total) by mouth every 6 (six) hours as needed.     Portions of this note were generated with dragon dictation software. Dictation errors may occur despite best attempts at proofreading.    Sharman Cheek, MD 08/26/16 1736

## 2016-08-26 NOTE — ED Triage Notes (Signed)
Pt reports RUQ abdominal pain with nausea for two days.

## 2016-11-06 ENCOUNTER — Emergency Department: Payer: Self-pay

## 2016-11-06 ENCOUNTER — Emergency Department
Admission: EM | Admit: 2016-11-06 | Discharge: 2016-11-06 | Disposition: A | Discharge status: Home / Self Care | Payer: Self-pay | Attending: Emergency Medicine | Admitting: Emergency Medicine

## 2016-11-06 ENCOUNTER — Encounter: Payer: Self-pay | Admitting: Emergency Medicine

## 2016-11-06 DIAGNOSIS — Z79899 Other long term (current) drug therapy: Secondary | ICD-10-CM | POA: Insufficient documentation

## 2016-11-06 DIAGNOSIS — R110 Nausea: Secondary | ICD-10-CM | POA: Insufficient documentation

## 2016-11-06 DIAGNOSIS — R109 Unspecified abdominal pain: Secondary | ICD-10-CM | POA: Insufficient documentation

## 2016-11-06 DIAGNOSIS — I10 Essential (primary) hypertension: Secondary | ICD-10-CM | POA: Insufficient documentation

## 2016-11-06 DIAGNOSIS — F17210 Nicotine dependence, cigarettes, uncomplicated: Secondary | ICD-10-CM | POA: Insufficient documentation

## 2016-11-06 LAB — URINALYSIS, COMPLETE (UACMP) WITH MICROSCOPIC
Bilirubin Urine: NEGATIVE
Glucose, UA: NEGATIVE mg/dL
Hgb urine dipstick: NEGATIVE
Ketones, ur: NEGATIVE mg/dL
LEUKOCYTES UA: NEGATIVE
NITRITE: NEGATIVE
PROTEIN: NEGATIVE mg/dL
SPECIFIC GRAVITY, URINE: 1.003 — AB (ref 1.005–1.030)
pH: 5 (ref 5.0–8.0)

## 2016-11-06 LAB — BASIC METABOLIC PANEL WITH GFR
Anion gap: 6 (ref 5–15)
BUN: 10 mg/dL (ref 6–20)
CO2: 24 mmol/L (ref 22–32)
Calcium: 9.1 mg/dL (ref 8.9–10.3)
Chloride: 110 mmol/L (ref 101–111)
Creatinine, Ser: 0.57 mg/dL (ref 0.44–1.00)
GFR calc Af Amer: >60 mL/min
GFR calc non Af Amer: >60 mL/min
Glucose, Bld: 112 mg/dL — ABNORMAL HIGH (ref 65–99)
Potassium: 4 mmol/L (ref 3.5–5.1)
Sodium: 140 mmol/L (ref 135–145)

## 2016-11-06 LAB — POCT PREGNANCY, URINE: Preg Test, Ur: NEGATIVE

## 2016-11-06 LAB — CBC
HEMATOCRIT: 38.7 % (ref 35.0–47.0)
HEMOGLOBIN: 13.9 g/dL (ref 12.0–16.0)
MCH: 32.1 pg (ref 26.0–34.0)
MCHC: 35.9 g/dL (ref 32.0–36.0)
MCV: 89.4 fL (ref 80.0–100.0)
Platelets: 178 10*3/uL (ref 150–440)
RBC: 4.33 MIL/uL (ref 3.80–5.20)
RDW: 12.7 % (ref 11.5–14.5)
WBC: 6.7 10*3/uL (ref 3.6–11.0)

## 2016-11-06 MED ORDER — LIDOCAINE 5 % EX PTCH: 1.0000 | MEDICATED_PATCH | Freq: Two times a day (BID) | CUTANEOUS | 0 refills | Status: DC

## 2016-11-06 MED ORDER — MORPHINE SULFATE (PF) 4 MG/ML IV SOLN
4.0000 mg | Freq: Once | INTRAVENOUS | Status: AC
  Administered 2016-11-06: 4 mg via INTRAVENOUS
  Filled 2016-11-06: qty 1

## 2016-11-06 MED ORDER — LIDOCAINE 5 % EX PTCH
1.0000 | MEDICATED_PATCH | CUTANEOUS | Status: DC
  Administered 2016-11-06: 1 via TRANSDERMAL
  Filled 2016-11-06: qty 1

## 2016-11-06 MED ORDER — ONDANSETRON HCL 4 MG/2ML IJ SOLN
4.0000 mg | INTRAMUSCULAR | Status: AC
  Administered 2016-11-06: 4 mg via INTRAVENOUS
  Filled 2016-11-06: qty 2

## 2016-11-06 MED ORDER — OXYCODONE-ACETAMINOPHEN 5-325 MG PO TABS
2.0000 | ORAL_TABLET | Freq: Once | ORAL | Status: DC
  Filled 2016-11-06: qty 2

## 2016-11-06 MED ORDER — KETOROLAC TROMETHAMINE 30 MG/ML IJ SOLN
15.0000 mg | Freq: Once | INTRAMUSCULAR | Status: AC
  Administered 2016-11-06: 15 mg via INTRAVENOUS
  Filled 2016-11-06: qty 1

## 2016-11-06 NOTE — ED Provider Notes (Signed)
[facility] Emergency Department Provider Note  ____________________________________________   First MD Initiated Contact with Patient 11/06/16 1252     (approximate)  I have reviewed the triage vital signs and the nursing notes.   HISTORY  Chief Complaint Flank Pain    HPI Amber Campos is a 41 y.o. female With a history of extensive abdominal surgeries including complete hysterectomy, salpingoophorectomy, and appendectomy, who presents by private vehicle for evaluation of right-sided flank pain that is radiating to the right side of her abdomen.  She has had severe nausea but no vomiting and no diarrhea.  She has had an increased urge to urinate but no hematuria and no dysuria.  She denies fever/chills, chest pain, shortness of breath, and any other abdominal pain.describes the pain as severe, sharp, worse with movement, and nothing makes it better.    Past Medical History:  Diagnosis Date  . Anxiety   . Chronic constipation   . Colon polyp 09/2014  . Depression   . Dyspareunia   . Dysphagia   . GERD (gastroesophageal reflux disease)   . History of chickenpox   . History of recurrent UTI (urinary tract infection)   . HLD (hyperlipidemia)   . HTN (hypertension)   . Hypogammaglobulinemia (HCC)   . Migraine   . Perimenopausal atrophic vaginitis   . Urinary frequency     Patient Active Problem List   Diagnosis Date Noted  . 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 CONTUSION OF BUTTOCK 08/10/2009  . HEAD TRAUMA, CLOSED 08/10/2009  . CHEST WALL PAIN, ACUTE 08/08/2009  . SHORTNESS OF BREATH 06/05/2009  . CHEST PAIN 05/15/2009  . ABDOMINAL PAIN OTHER SPECIFIED SITE 05/09/2009  . SINUSITIS- ACUTE-NOS 08/17/2008  . URI 06/23/2008  . TOBACCO USE 05/30/2008  . Depressive disorder, not elsewhere classified 05/30/2008  . ALLERGIC RHINITIS 05/30/2008  . GERD 05/30/2008  . HEADACHE 05/30/2008    Past Surgical History:  Procedure Laterality Date   . ABDOMINAL HYSTERECTOMY  2015   Complete  . ABDOMINAL HYSTERECTOMY    . APPENDECTOMY    . CESAREAN SECTION    . COLONOSCOPY  09/2014   polyp removed  . ENDOMETRIAL ABLATION    . ESOPHAGOGASTRODUODENOSCOPY  09/2014  . KNEE ARTHROSCOPY Bilateral 2011, 2015  . TONSILLECTOMY AND ADENOIDECTOMY      Prior to Admission medications   Medication Sig Start Date End Date Taking? Authorizing Provider  clonazePAM (KLONOPIN) 1 MG tablet Take 1 mg by mouth 2 (two) times daily.  10/01/13  Yes Historical Provider, MD  DULoxetine (CYMBALTA) 30 MG capsule Take 90 mg by mouth daily.   Yes Historical Provider, MD  metoprolol tartrate (LOPRESSOR) 25 MG tablet Take 25 mg by mouth daily.   Yes Historical Provider, MD  Multiple Vitamin (MULTIVITAMIN) capsule Take 1 capsule by mouth daily.   Yes Historical Provider, MD  omeprazole (PRILOSEC) 20 MG capsule Take 20 mg by mouth daily.   Yes Historical Provider, MD  calcium carbonate (TUMS) 500 MG chewable tablet Chew 2 tablets (400 mg of elemental calcium total) by mouth 3 (three) times daily as needed for indigestion or heartburn. Patient not taking: Reported on 11/06/2016 08/26/16 08/26/17  Sharman Cheek, MD  ciprofloxacin (CIPRO) 500 MG tablet Take 1 tablet (500 mg total) by mouth 2 (two) times daily. Patient not taking: Reported on 08/24/2015 08/01/15   Earna Coder, MD  famotidine (PEPCID) 20 MG tablet Take 1 tablet (20 mg total) by mouth 2 (two) times daily. Patient not taking:  Reported on 11/06/2016 08/26/16   Sharman Cheek, MD  HYDROcodone-acetaminophen (NORCO/VICODIN) 5-325 MG tablet Take 1 tablet by mouth every 4 (four) hours as needed for moderate pain. Patient not taking: Reported on 11/06/2016 12/22/15   Delorise Royals Cuthriell, PA-C  lidocaine (LIDODERM) 5 % Place 1 patch onto the skin every 12 (twelve) hours. Remove & Discard patch within 12 hours or as directed by MD.  Wynelle Fanny the patch off for 12 hours before applying a new one. 11/06/16 11/06/17   Loleta Rose, MD  meloxicam (MOBIC) 15 MG tablet Take 1 tablet (15 mg total) by mouth daily. Patient not taking: Reported on 11/06/2016 12/22/15   Delorise Royals Cuthriell, PA-C  metoCLOPramide (REGLAN) 10 MG tablet Take 1 tablet (10 mg total) by mouth every 6 (six) hours as needed. Patient not taking: Reported on 11/06/2016 08/26/16   Sharman Cheek, MD  promethazine (PHENERGAN) 25 MG tablet Take 1 tablet (25 mg total) by mouth every 6 (six) hours as needed for nausea or vomiting. Patient not taking: Reported on 08/31/2015 08/24/15   Sharman Cheek, MD  ranitidine (ZANTAC) 150 MG capsule Take 1 capsule (150 mg total) by mouth 2 (two) times daily. Patient not taking: Reported on 08/31/2015 08/24/15   Sharman Cheek, MD  sucralfate (CARAFATE) 1 g tablet Take 1 tablet (1 g total) by mouth 4 (four) times daily. Patient not taking: Reported on 08/31/2015 08/24/15   Sharman Cheek, MD    Allergies Oxycodone-acetaminophen; Amoxicillin; and Other  Family History  Problem Relation Age of Onset  . Hypertension Mother   . Hyperlipidemia Mother   . Diabetes Mother   . Nephrolithiasis Mother     Father  . Stroke Mother   . Heart disease Mother   . Colon polyps Mother   . Hypertension Father   . Hyperlipidemia Father   . Transient ischemic attack Father   . Prostate cancer Paternal Grandfather   . Deep vein thrombosis      Social History Social History  Substance Use Topics  . Smoking status: Current Every Day Smoker    Packs/day: 0.25    Years: 20.00    Types: Cigarettes  . Smokeless tobacco: Never Used  . Alcohol use 0.0 oz/week     Comment: occasional wine an dliquor    Review of Systems Constitutional: No fever/chills Eyes: No visual changes. ENT: No sore throat. Cardiovascular: Denies chest pain. Respiratory: Denies shortness of breath. Gastrointestinal: severe right sided flank pain radiating to her right abdomen with nausea and no vomiting, no diarrhea Genitourinary: Negative  for dysuria. Musculoskeletal: Negative for back pain. Skin: Negative for rash. Neurological: Negative for headaches, focal weakness or numbness.  10-point ROS otherwise negative.  ____________________________________________   PHYSICAL EXAM:  VITAL SIGNS: ED Triage Vitals  Enc Vitals Group     BP 11/06/16 1107 (!) 131/99     Pulse Rate 11/06/16 1107 78     Resp 11/06/16 1107 16     Temp 11/06/16 1107 98.1 F (36.7 C)     Temp Source 11/06/16 1107 Oral     SpO2 11/06/16 1107 98 %     Weight 11/06/16 1107 198 lb (89.8 kg)     Height 11/06/16 1107 5\' 4"  (1.626 m)     Head Circumference --      Peak Flow --      Pain Score 11/06/16 1106 10     Pain Loc --      Pain Edu? --      Excl. in  GC? --     Constitutional: Alert and oriented. Well appearing though she does appear uncomfortable Eyes: Conjunctivae are normal. PERRL. EOMI. Head: Atraumatic. Nose: No congestion/rhinnorhea. Mouth/Throat: Mucous membranes are moist. Neck: No stridor.  No meningeal signs.   Cardiovascular: Normal rate, regular rhythm. Good peripheral circulation. Grossly normal heart sounds. Respiratory: Normal respiratory effort.  No retractions. Lungs CTAB. Gastrointestinal: Soft with severe tenderness to palpation of the right side of her abdomen as well as right-sided CVA tenderness. No rebound and no guarding Genitourinary: deferred at patient request/preference Musculoskeletal: No lower extremity tenderness nor edema. No gross deformities of extremities. Neurologic:  Normal speech and language. No gross focal neurologic deficits are appreciated.  Skin:  Skin is warm, dry and intact. No rash noted. Psychiatric: Mood and affect are normal. Speech and behavior are normal.  ____________________________________________   LABS (all labs ordered are listed, but only abnormal results are displayed)  Labs Reviewed  URINALYSIS, COMPLETE (UACMP) WITH MICROSCOPIC - Abnormal; Notable for the following:        Result Value   Color, Urine STRAW (*)    APPearance CLEAR (*)    Specific Gravity, Urine 1.003 (*)    Bacteria, UA RARE (*)    Squamous Epithelial / LPF 0-5 (*)    All other components within normal limits  BASIC METABOLIC PANEL - Abnormal; Notable for the following:    Glucose, Bld 112 (*)    All other components within normal limits  CBC  POC URINE PREG, ED  POCT PREGNANCY, URINE   ____________________________________________  EKG  None - EKG not ordered by ED physician ____________________________________________  RADIOLOGY   Ct Renal Stone Study  Result Date: 11/06/2016 CLINICAL DATA:  RIGHT flank pain and nausea since Monday, no history kidney stones, prior hysterectomy and appendectomy, hypertension, GERD EXAM: CT ABDOMEN AND PELVIS WITHOUT CONTRAST TECHNIQUE: Multidetector CT imaging of the abdomen and pelvis was performed following the standard protocol without IV contrast. Sagittal and coronal MPR images reconstructed from axial data set. Oral contrast was not administered. COMPARISON:  03/08/2015 FINDINGS: Lower chest: Clear lung bases Hepatobiliary: Gallbladder and liver normal appearance Pancreas: Normal appearance Spleen: Normal appearance Adrenals/Urinary Tract: Adrenal glands normal appearance. Kidneys, ureters, and bladder normal appearance. No urinary tract calcification or dilatation. No renal mass. Stomach/Bowel: Appendix surgically absent. Stomach and bowel loops normal appearance. Vascular/Lymphatic: Minimal atherosclerotic calcification aorta. Aorta normal caliber. No adenopathy. Reproductive: Uterus surgically absent. Nonvisualization of ovaries. Other: No free air free fluid.  No hernia. Musculoskeletal: Unremarkable IMPRESSION: No acute intra-abdominal or intrapelvic abnormalities. Minimal aortic atherosclerosis. Electronically Signed   By: Ulyses Southward M.D.   On: 11/06/2016 13:24    ____________________________________________   PROCEDURES  Critical Care  performed: No   Procedure(s) performed:   Procedures   ____________________________________________   INITIAL IMPRESSION / ASSESSMENT AND PLAN / ED COURSE  Pertinent labs & imaging results that were available during my care of the patient were reviewed by me and considered in my medical decision making (see chart for details).  the patient has had most of her abdominal organs removed and denies any vaginal symptoms.  I suspect based on her history of present illness that she is suffering from renal colic.  Her urinalysis is normal.  I will assess with CT renal stone study protocol.  I offered oral pain medicine but she prefers an IV to try to get her pain under control as soon as possible and I think that is reasonable.   Clinical Course as  of Nov 07 1454  Wed Nov 06, 2016  1449 I reviewed the patient's prescription history over the last 12 months in the multi-state controlled substances database(s) that includes [place], [place], [place], [place], [place], [place], [place], [place], [place], [place], [place], [place], [place], and [place].  Results were notable for 24 prescriptions for benzodiazepines and pain medications in that time.  The clonazepam is prescribed by a variety of providers from multiple different locations and constitutes 10 of the prescriptions.  She has 14 prescriptions for narcotic-containing medications.  [CF]  1453 I explained to the patient that her workup was very reassuring today with normal vital signs, normal lab work, normal urinalysis, and normal CT scan.  I cannot explain the cause of her pain but I gave her my usual and customary abdominal pain return precautions.  I am prescribing Lidoderm patch given that her pain is relatively focal on her flank and I encouraged her to follow up with her primary care doctor at the next available opportunity.  She understands and agrees with plan.  [CF]    Clinical Course User  Index [CF] Loleta Roseory Kalkidan Caudell, MD    ____________________________________________  FINAL CLINICAL IMPRESSION(S) / ED DIAGNOSES  Final diagnoses:  Right flank pain  Right sided abdominal pain     MEDICATIONS GIVEN DURING THIS VISIT:  Medications  oxyCODONE-acetaminophen (PERCOCET/ROXICET) 5-325 MG per tablet 2 tablet (not administered)  lidocaine (LIDODERM) 5 % 1 patch (1 patch Transdermal Patch Applied 11/06/16 1440)  morphine 4 MG/ML injection 4 mg (4 mg Intravenous Given 11/06/16 1335)  ondansetron (ZOFRAN) injection 4 mg (4 mg Intravenous Given 11/06/16 1335)  ketorolac (TORADOL) 30 MG/ML injection 15 mg (15 mg Intravenous Given 11/06/16 1439)     NEW OUTPATIENT MEDICATIONS STARTED DURING THIS VISIT:  New Prescriptions   LIDOCAINE (LIDODERM) 5 %    Place 1 patch onto the skin every 12 (twelve) hours. Remove & Discard patch within 12 hours or as directed by MD.  Wynelle FannyLeave the patch off for 12 hours before applying a new one.    Modified Medications   No medications on file    Discontinued Medications   ESOMEPRAZOLE (NEXIUM) 40 MG CAPSULE    Take 40 mg by mouth daily at 12 noon.   SERTRALINE (ZOLOFT) 100 MG TABLET    Take 100 mg by mouth at bedtime.      Note:  This document was prepared using Dragon voice recognition software and may include unintentional dictation errors.    Loleta Roseory Juliya Magill, MD 11/06/16 [phone]

## 2016-11-06 NOTE — Discharge Instructions (Signed)
You have been seen in the Emergency Department (ED) for right sided flank and abdominal pain.  Your evaluation did not identify a clear cause of your symptoms but was generally reassuring.  Please follow up as instructed above regarding today?s emergent visit and the symptoms that are bothering you.  Return to the ED if your abdominal pain worsens or fails to improve, you develop bloody vomiting, bloody diarrhea, you are unable to tolerate fluids due to vomiting, fever greater than 101, or other symptoms that concern you.

## 2016-11-06 NOTE — ED Triage Notes (Signed)
Pt to ED via POV w/ c/o RT side flank pain x 2days. Denies any dysuria, states increased urge to urinate. Pt denies fevers. VS stable.

## 2017-01-17 ENCOUNTER — Emergency Department
Admission: EM | Admit: 2017-01-17 | Discharge: 2017-01-17 | Disposition: A | Discharge status: Home / Self Care | Payer: Self-pay | Attending: Emergency Medicine | Admitting: Emergency Medicine

## 2017-01-17 ENCOUNTER — Emergency Department: Payer: Self-pay

## 2017-01-17 ENCOUNTER — Encounter: Payer: Self-pay | Admitting: Emergency Medicine

## 2017-01-17 DIAGNOSIS — F17210 Nicotine dependence, cigarettes, uncomplicated: Secondary | ICD-10-CM | POA: Insufficient documentation

## 2017-01-17 DIAGNOSIS — R112 Nausea with vomiting, unspecified: Secondary | ICD-10-CM | POA: Insufficient documentation

## 2017-01-17 DIAGNOSIS — R1084 Generalized abdominal pain: Secondary | ICD-10-CM | POA: Insufficient documentation

## 2017-01-17 DIAGNOSIS — R197 Diarrhea, unspecified: Secondary | ICD-10-CM | POA: Insufficient documentation

## 2017-01-17 DIAGNOSIS — I10 Essential (primary) hypertension: Secondary | ICD-10-CM | POA: Insufficient documentation

## 2017-01-17 DIAGNOSIS — Z79899 Other long term (current) drug therapy: Secondary | ICD-10-CM | POA: Insufficient documentation

## 2017-01-17 LAB — COMPREHENSIVE METABOLIC PANEL
ALT: 22 U/L (ref 14–54)
AST: 29 U/L (ref 15–41)
Albumin: 3.9 g/dL (ref 3.5–5.0)
Alkaline Phosphatase: 50 U/L (ref 38–126)
Anion gap: 9 (ref 5–15)
BUN: 9 mg/dL (ref 6–20)
CALCIUM: 8.7 mg/dL — AB (ref 8.9–10.3)
CHLORIDE: 109 mmol/L (ref 101–111)
CO2: 25 mmol/L (ref 22–32)
CREATININE: 0.61 mg/dL (ref 0.44–1.00)
GFR calc non Af Amer: >60 mL/min (ref >60)
Glucose, Bld: 124 mg/dL — ABNORMAL HIGH (ref 65–99)
Potassium: 3.5 mmol/L (ref 3.5–5.1)
SODIUM: 143 mmol/L (ref 135–145)
Total Bilirubin: 0.3 mg/dL (ref 0.3–1.2)
Total Protein: 6.6 g/dL (ref 6.5–8.1)

## 2017-01-17 LAB — URINALYSIS, COMPLETE (UACMP) WITH MICROSCOPIC
Bilirubin Urine: NEGATIVE
GLUCOSE, UA: NEGATIVE mg/dL
Hgb urine dipstick: NEGATIVE
KETONES UR: NEGATIVE mg/dL
Leukocytes, UA: NEGATIVE
Nitrite: NEGATIVE
PROTEIN: NEGATIVE mg/dL
RBC / HPF: NONE SEEN RBC/hpf (ref 0–5)
Specific Gravity, Urine: 1.02 (ref 1.005–1.030)
pH: 5 (ref 5.0–8.0)

## 2017-01-17 LAB — CBC
HCT: 37.4 % (ref 35.0–47.0)
HEMOGLOBIN: 13.4 g/dL (ref 12.0–16.0)
MCH: 31.7 pg (ref 26.0–34.0)
MCHC: 35.8 g/dL (ref 32.0–36.0)
MCV: 88.7 fL (ref 80.0–100.0)
PLATELETS: 190 10*3/uL (ref 150–440)
RBC: 4.22 MIL/uL (ref 3.80–5.20)
RDW: 12.7 % (ref 11.5–14.5)
WBC: 8.8 10*3/uL (ref 3.6–11.0)

## 2017-01-17 LAB — LIPASE, BLOOD: Lipase: 35 U/L (ref 11–51)

## 2017-01-17 MED ORDER — IOPAMIDOL (ISOVUE-300) INJECTION 61%
100.0000 mL | Freq: Once | INTRAVENOUS | Status: AC | PRN
  Administered 2017-01-17: 100 mL via INTRAVENOUS

## 2017-01-17 MED ORDER — KETOROLAC TROMETHAMINE 30 MG/ML IJ SOLN
INTRAMUSCULAR | Status: AC
  Administered 2017-01-17: 30 mg via INTRAVENOUS
  Filled 2017-01-17: qty 1

## 2017-01-17 MED ORDER — DICYCLOMINE HCL 10 MG PO CAPS
ORAL_CAPSULE | ORAL | Status: AC
  Administered 2017-01-17: 10 mg via ORAL
  Filled 2017-01-17: qty 1

## 2017-01-17 MED ORDER — CIPROFLOXACIN HCL 500 MG PO TABS: 500.0000 mg | ORAL_TABLET | Freq: Two times a day (BID) | ORAL | 0 refills | Status: AC

## 2017-01-17 MED ORDER — DICYCLOMINE HCL 10 MG PO CAPS
10.0000 mg | ORAL_CAPSULE | Freq: Once | ORAL | Status: AC
  Administered 2017-01-17: 10 mg via ORAL

## 2017-01-17 MED ORDER — SODIUM CHLORIDE 0.9 % IV BOLUS (SEPSIS)
1000.0000 mL | Freq: Once | INTRAVENOUS | Status: AC
  Administered 2017-01-17: 1000 mL via INTRAVENOUS

## 2017-01-17 MED ORDER — DICYCLOMINE HCL 10 MG PO CAPS: 10.0000 mg | ORAL_CAPSULE | Freq: Four times a day (QID) | ORAL | 0 refills | Status: DC

## 2017-01-17 MED ORDER — KETOROLAC TROMETHAMINE 30 MG/ML IJ SOLN
30.0000 mg | Freq: Once | INTRAMUSCULAR | Status: AC
  Administered 2017-01-17: 30 mg via INTRAVENOUS

## 2017-01-17 MED ORDER — ONDANSETRON 4 MG PO TBDP: 4.0000 mg | ORAL_TABLET | Freq: Three times a day (TID) | ORAL | 0 refills | Status: DC | PRN

## 2017-01-17 NOTE — ED Triage Notes (Signed)
Patient ambulatory to triage with steady gait, without difficulty or distress noted; pt st since Tuesday having lower abd cramping accomp by diarrhea

## 2017-01-17 NOTE — ED Notes (Signed)
Pt states abd pain still 9/10 at this time, meds given per md order.

## 2017-01-17 NOTE — ED Notes (Addendum)
Pt reports hx of appendectomy, complete hysterectomy and acid reflux. Pt denies hx of liver or pancreas disease.

## 2017-01-17 NOTE — ED Notes (Signed)
Patient discharged to home per MD order. Patient in stable condition, and deemed medically cleared by ED provider for discharge. Discharge instructions reviewed with patient/family using "Teach Back"; verbalized understanding of medication education and administration, and information about follow-up care. Denies further concerns. ° °

## 2017-01-17 NOTE — Discharge Instructions (Signed)
Please follow up. Return with any worsened symptoms

## 2017-01-17 NOTE — ED Notes (Signed)
Pt states no change in pain, rates it 9/10

## 2017-01-17 NOTE — ED Notes (Signed)
Pt still co pain to lower abd, rates it 9/10 at this time. Pt awaiting ct scan.
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
## 2017-01-17 NOTE — ED Notes (Signed)
Pt to ct scan.

## 2017-01-18 LAB — URINE CULTURE: CULTURE: NO GROWTH

## 2017-01-30 ENCOUNTER — Emergency Department
Admission: EM | Admit: 2017-01-30 | Discharge: 2017-01-30 | Disposition: A | Discharge status: Home / Self Care | Payer: Self-pay | Attending: Emergency Medicine | Admitting: Emergency Medicine

## 2017-01-30 ENCOUNTER — Encounter: Payer: Self-pay | Admitting: Emergency Medicine

## 2017-01-30 ENCOUNTER — Emergency Department: Payer: Self-pay

## 2017-01-30 DIAGNOSIS — W228XXA Striking against or struck by other objects, initial encounter: Secondary | ICD-10-CM | POA: Insufficient documentation

## 2017-01-30 DIAGNOSIS — F17210 Nicotine dependence, cigarettes, uncomplicated: Secondary | ICD-10-CM | POA: Insufficient documentation

## 2017-01-30 DIAGNOSIS — S60221A Contusion of right hand, initial encounter: Secondary | ICD-10-CM | POA: Insufficient documentation

## 2017-01-30 DIAGNOSIS — Y999 Unspecified external cause status: Secondary | ICD-10-CM | POA: Insufficient documentation

## 2017-01-30 DIAGNOSIS — Y929 Unspecified place or not applicable: Secondary | ICD-10-CM | POA: Insufficient documentation

## 2017-01-30 DIAGNOSIS — Y939 Activity, unspecified: Secondary | ICD-10-CM | POA: Insufficient documentation

## 2017-01-30 DIAGNOSIS — Z79899 Other long term (current) drug therapy: Secondary | ICD-10-CM | POA: Insufficient documentation

## 2017-01-30 NOTE — Discharge Instructions (Signed)
Your exam and x-ray are normal today. Keep the hand wrapped for comfort. Apply ice to reduce swelling. Take ibuprofen for pain and inflammation.

## 2017-01-30 NOTE — ED Triage Notes (Signed)
Pt in via POV with complaints of right hand pain, pt states, "I hit a wall."  Swelling and bruising noted to right hand.  NAD noted at this time.
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
## 2018-01-06 DIAGNOSIS — M722 Plantar fascial fibromatosis: Secondary | ICD-10-CM | POA: Insufficient documentation

## 2018-01-06 DIAGNOSIS — S93402A Sprain of unspecified ligament of left ankle, initial encounter: Secondary | ICD-10-CM | POA: Insufficient documentation

## 2018-10-20 ENCOUNTER — Emergency Department: Payer: Medicaid Other

## 2018-10-20 ENCOUNTER — Encounter: Payer: Self-pay | Admitting: *Deleted

## 2018-10-20 ENCOUNTER — Other Ambulatory Visit: Payer: Self-pay

## 2018-10-20 ENCOUNTER — Emergency Department
Admission: EM | Admit: 2018-10-20 | Discharge: 2018-10-20 | Disposition: A | Discharge status: Home / Self Care | Payer: Medicaid Other | Attending: Emergency Medicine | Admitting: Emergency Medicine

## 2018-10-20 DIAGNOSIS — Z79899 Other long term (current) drug therapy: Secondary | ICD-10-CM | POA: Diagnosis not present

## 2018-10-20 DIAGNOSIS — I10 Essential (primary) hypertension: Secondary | ICD-10-CM | POA: Diagnosis not present

## 2018-10-20 DIAGNOSIS — F17210 Nicotine dependence, cigarettes, uncomplicated: Secondary | ICD-10-CM | POA: Diagnosis not present

## 2018-10-20 DIAGNOSIS — R079 Chest pain, unspecified: Secondary | ICD-10-CM

## 2018-10-20 LAB — BASIC METABOLIC PANEL
Anion gap: 10 (ref 5–15)
BUN: 6 mg/dL (ref 6–20)
CO2: 27 mmol/L (ref 22–32)
Calcium: 9.2 mg/dL (ref 8.9–10.3)
Chloride: 106 mmol/L (ref 98–111)
Creatinine, Ser: 0.81 mg/dL (ref 0.44–1.00)
GFR calc non Af Amer: >60 mL/min (ref >60)
GLUCOSE: 106 mg/dL — AB (ref 70–99)
POTASSIUM: 3.9 mmol/L (ref 3.5–5.1)
Sodium: 143 mmol/L (ref 135–145)

## 2018-10-20 LAB — CBC
HEMATOCRIT: 37.7 % (ref 36.0–46.0)
HEMOGLOBIN: 12.9 g/dL (ref 12.0–15.0)
MCH: 30.6 pg (ref 26.0–34.0)
MCHC: 34.2 g/dL (ref 30.0–36.0)
MCV: 89.5 fL (ref 80.0–100.0)
Platelets: 196 10*3/uL (ref 150–400)
RBC: 4.21 MIL/uL (ref 3.87–5.11)
RDW: 12.3 % (ref 11.5–15.5)
WBC: 7.3 10*3/uL (ref 4.0–10.5)
nRBC: 0 % (ref 0.0–0.2)

## 2018-10-20 LAB — TROPONIN I
Troponin I: <0.03 ng/mL (ref <0.03)
Troponin I: <0.03 ng/mL (ref <0.03)

## 2018-10-20 MED ORDER — SODIUM CHLORIDE 0.9% FLUSH: 3.0000 mL | Freq: Once | INTRAVENOUS | Status: DC

## 2018-10-20 NOTE — ED Triage Notes (Signed)
Pt brought in via [facility] from [facility].  Pt has chest pain and heaviness.  Pain radiates into left side of neck.  Pt was given asa and ntg by ems today.  Pt alert speech clear.

## 2018-10-20 NOTE — ED Provider Notes (Signed)
[facility] Emergency Department Provider Note  Time seen: 8:26 PM  I have reviewed the triage vital signs and the nursing notes.   HISTORY  Chief Complaint Chest Pain   HPI Amber Campos is a 43 y.o. female with a past medical history of anxiety, depression, gastric reflux, hypertension, hyperlipidemia, presents to the emergency department for chest pain.  According to the patient she had an appointment today with her doctor, went to the office and there began having chest pressure and tightness felt somewhat short of breath as well.  EMS was called and brought the patient to the emergency department.  They gave the patient nitroglycerin and she states she felt much better upon arrival.  Patient states she has had chest pain before, her doctor thought it could have been an anxiety attack but recommended she still come to the ER for evaluation to make sure.  Patient currently denies any symptoms.  Denies any shortness of breath or any chest pain pressure or tightness.  No leg pain or swelling.  No recent cough, congestion or fever.  Largely negative review of systems.   Past Medical History:  Diagnosis Date  . Anxiety   . Chronic constipation   . Colon polyp 09/2014  . Depression   . Dyspareunia   . Dysphagia   . GERD (gastroesophageal reflux disease)   . History of chickenpox   . History of recurrent UTI (urinary tract infection)   . HLD (hyperlipidemia)   . HTN (hypertension)   . Hypogammaglobulinemia (HCC)   . Migraine   . Perimenopausal atrophic vaginitis   . Urinary frequency     Patient Active Problem List   Diagnosis Date Noted  . 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 CONTUSION OF BUTTOCK 08/10/2009  . HEAD TRAUMA, CLOSED 08/10/2009  . CHEST WALL PAIN, ACUTE 08/08/2009  . SHORTNESS OF BREATH 06/05/2009  . CHEST PAIN 05/15/2009  . ABDOMINAL PAIN OTHER SPECIFIED SITE 05/09/2009  . SINUSITIS- ACUTE-NOS 08/17/2008  . URI 06/23/2008  .  TOBACCO USE 05/30/2008  . Depressive disorder, not elsewhere classified 05/30/2008  . ALLERGIC RHINITIS 05/30/2008  . GERD 05/30/2008  . HEADACHE 05/30/2008    Past Surgical History:  Procedure Laterality Date  . ABDOMINAL HYSTERECTOMY  2015   Complete  . ABDOMINAL HYSTERECTOMY    . APPENDECTOMY    . CESAREAN SECTION    . COLONOSCOPY  09/2014   polyp removed  . ENDOMETRIAL ABLATION    . ESOPHAGOGASTRODUODENOSCOPY  09/2014  . KNEE ARTHROSCOPY Bilateral 2011, 2015  . TONSILLECTOMY AND ADENOIDECTOMY      Prior to Admission medications   Medication Sig Start Date End Date Taking? Authorizing Provider  clonazePAM (KLONOPIN) 1 MG tablet Take 1 mg by mouth 2 (two) times daily.  10/01/13   [provider]  dicyclomine (BENTYL) 10 MG capsule Take 1 capsule (10 mg total) by mouth 4 (four) times daily. 01/17/17 01/31/17  Rebecka Apley, MD  DULoxetine (CYMBALTA) 30 MG capsule Take 90 mg by mouth daily.    [provider]  metoprolol tartrate (LOPRESSOR) 25 MG tablet Take 25 mg by mouth daily.    [provider]  Multiple Vitamin (MULTIVITAMIN) capsule Take 1 capsule by mouth daily.    [provider]  omeprazole (PRILOSEC) 20 MG capsule Take 20 mg by mouth daily.    [provider]  ondansetron (ZOFRAN ODT) 4 MG disintegrating tablet Take 1 tablet (4 mg total) by mouth every 8 (eight) hours as needed  for nausea or vomiting. 01/17/17   Rebecka Apley, MD    Allergies  Allergen Reactions  . Amoxicillin Hives and Itching  . Other Other (See Comments)    Pt allergic to "All meats"    Family History  Problem Relation Age of Onset  . Hypertension Mother   . Hyperlipidemia Mother   . Diabetes Mother   . Nephrolithiasis Mother        Father  . Stroke Mother   . Heart disease Mother   . Colon polyps Mother   . Hypertension Father   . Hyperlipidemia Father   . Transient ischemic attack Father   . Prostate cancer Paternal Grandfather   .  Deep vein thrombosis Other     Social History Social History   Tobacco Use  . Smoking status: Current Every Day Smoker    Packs/day: 0.25    Years: 20.00    Pack years: 5.00    Types: Cigarettes  . Smokeless tobacco: Never Used  Substance Use Topics  . Alcohol use: Yes    Alcohol/week: 0.0 standard drinks    Comment: occasional wine an dliquor  . Drug use: No    Review of Systems Constitutional: Negative for fever Cardiovascular: Positive for chest tightness/pressure, now resolved Respiratory: Negative for shortness of breath. Gastrointestinal: Negative for abdominal pain, vomiting Musculoskeletal: Negative for musculoskeletal complaints Skin: Negative for skin complaints  Neurological: Negative for headache All other ROS negative  ____________________________________________   PHYSICAL EXAM:  VITAL SIGNS: ED Triage Vitals  Enc Vitals Group     BP 10/20/18 1625 129/74     Pulse Rate 10/20/18 1625 71     Resp 10/20/18 1625 20     Temp 10/20/18 1625 98.4 F (36.9 C)     Temp Source 10/20/18 1625 Oral     SpO2 10/20/18 1625 99 %     Weight 10/20/18 1623 215 lb (97.5 kg)     Height 10/20/18 1623 5\' 4"  (1.626 m)     Head Circumference --      Peak Flow --      Pain Score 10/20/18 1623 4     Pain Loc --      Pain Edu? --      Excl. in GC? --     Constitutional: Alert and oriented. Well appearing and in no distress. Eyes: Normal exam ENT   Head: Normocephalic and atraumatic.   Mouth/Throat: Mucous membranes are moist. Cardiovascular: Normal rate, regular rhythm. No murmurs, rubs, or gallops. Respiratory: Normal respiratory effort without tachypnea nor retractions. Breath sounds are clear  Gastrointestinal: Soft and nontender. No distention. Musculoskeletal: Nontender with normal range of motion in all extremities. No lower extremity tenderness or edema. Neurologic:  Normal speech and language. No gross focal neurologic deficits  Skin:  Skin is warm, dry  and intact.  Psychiatric: Mood and affect are normal.  ____________________________________________    EKG  EKG viewed and interpreted by myself shows a normal sinus rhythm at 72 bpm with a narrow QRS, normal axis, normal intervals, no concerning ST changes.  ____________________________________________    RADIOLOGY  Chest x-ray is negative  ____________________________________________   INITIAL IMPRESSION / ASSESSMENT AND PLAN / ED COURSE  Pertinent labs & imaging results that were available during my care of the patient were reviewed by me and considered in my medical decision making (see chart for details).  Patient presents to the emergency department for chest pressure/tightness along with shortness of breath now resolved.  Patient did  receive nitroglycerin prior to arrival.  Differential would include ACS, angina, chest wall discomfort, anxiety.  Overall the patient appears very well, no distress has no symptoms at this time.  We will check labs including cardiac enzymes, EKG and chest x-ray.  Patient's EKG is reassuring, chest x-ray is clear.  Labs are normal including a negative troponin.  Repeat troponin will be sent at least 2 hours after initial troponin.  Troponin is negative.  Patient continues to appear extremely well we will discharge home with PCP follow-up.  ____________________________________________   FINAL CLINICAL IMPRESSION(S) / ED DIAGNOSES  Chest pain   Minna Antis, MD 10/20/18 2029

## 2018-10-20 NOTE — ED Notes (Signed)
Patient wanting to leave. Stating she has kids at home. This Clinical research associate advised patient she would be leaving AMA. I suggested patient stay and left [place] run this last test. Patient agreed with plan.

## 2019-01-13 ENCOUNTER — Other Ambulatory Visit: Payer: Self-pay | Admitting: Student

## 2019-01-13 DIAGNOSIS — R1011 Right upper quadrant pain: Secondary | ICD-10-CM

## 2019-01-13 DIAGNOSIS — R140 Abdominal distension (gaseous): Secondary | ICD-10-CM

## 2019-01-14 ENCOUNTER — Other Ambulatory Visit: Payer: Self-pay | Admitting: Student

## 2019-01-14 DIAGNOSIS — R140 Abdominal distension (gaseous): Secondary | ICD-10-CM

## 2019-01-14 DIAGNOSIS — R1011 Right upper quadrant pain: Secondary | ICD-10-CM

## 2019-01-21 ENCOUNTER — Ambulatory Visit: Payer: Self-pay

## 2019-01-26 ENCOUNTER — Encounter
Admission: RE | Admit: 2019-01-26 | Discharge: 2019-01-26 | Disposition: A | Discharge status: Home / Self Care | Payer: Medicaid Other | Source: Ambulatory Visit | Attending: Student | Admitting: Student

## 2019-01-26 ENCOUNTER — Ambulatory Visit
Admission: RE | Admit: 2019-01-26 | Discharge: 2019-01-26 | Disposition: A | Discharge status: Home / Self Care | Payer: Medicaid Other | Source: Ambulatory Visit | Attending: Student | Admitting: Student

## 2019-01-26 ENCOUNTER — Other Ambulatory Visit: Payer: Self-pay

## 2019-01-26 DIAGNOSIS — R1011 Right upper quadrant pain: Secondary | ICD-10-CM | POA: Insufficient documentation

## 2019-01-26 DIAGNOSIS — R140 Abdominal distension (gaseous): Secondary | ICD-10-CM | POA: Diagnosis present

## 2019-01-26 MED ORDER — SINCALIDE 5 MCG IJ SOLR
0.0200 ug/kg | Freq: Once | INTRAMUSCULAR | Status: AC
  Administered 2019-01-26: 1.95 ug via INTRAVENOUS

## 2019-01-26 MED ORDER — TECHNETIUM TC 99M MEBROFENIN IV KIT
5.3830 | PACK | Freq: Once | INTRAVENOUS | Status: AC | PRN
  Administered 2019-01-26: 09:00:00 5.383 via INTRAVENOUS

## 2019-01-29 DIAGNOSIS — R1010 Upper abdominal pain, unspecified: Secondary | ICD-10-CM | POA: Insufficient documentation

## 2019-01-29 DIAGNOSIS — K2980 Duodenitis without bleeding: Secondary | ICD-10-CM | POA: Insufficient documentation

## 2019-01-31 DIAGNOSIS — K760 Fatty (change of) liver, not elsewhere classified: Secondary | ICD-10-CM | POA: Insufficient documentation

## 2019-02-03 ENCOUNTER — Other Ambulatory Visit
Admission: RE | Admit: 2019-02-03 | Discharge: 2019-02-03 | Disposition: A | Discharge status: Home / Self Care | Payer: Medicaid Other | Source: Ambulatory Visit | Attending: Orthopedic Surgery | Admitting: Orthopedic Surgery

## 2019-02-03 ENCOUNTER — Other Ambulatory Visit (HOSPITAL_COMMUNITY): Payer: Self-pay | Admitting: Student

## 2019-02-03 ENCOUNTER — Other Ambulatory Visit: Payer: Self-pay | Admitting: Student

## 2019-02-03 DIAGNOSIS — K5909 Other constipation: Secondary | ICD-10-CM

## 2019-02-03 DIAGNOSIS — R300 Dysuria: Secondary | ICD-10-CM | POA: Insufficient documentation

## 2019-02-03 DIAGNOSIS — R1010 Upper abdominal pain, unspecified: Secondary | ICD-10-CM

## 2019-02-03 LAB — URINALYSIS, COMPLETE (UACMP) WITH MICROSCOPIC
Bacteria, UA: NONE SEEN
Bilirubin Urine: NEGATIVE
Glucose, UA: NEGATIVE mg/dL
Hgb urine dipstick: NEGATIVE
Ketones, ur: NEGATIVE mg/dL
Leukocytes,Ua: NEGATIVE
Nitrite: NEGATIVE
Protein, ur: NEGATIVE mg/dL
Specific Gravity, Urine: 1.005 (ref 1.005–1.030)
pH: 6 (ref 5.0–8.0)

## 2019-02-04 LAB — URINE CULTURE: Culture: <10000 — AB

## 2019-02-09 ENCOUNTER — Other Ambulatory Visit: Payer: Self-pay

## 2019-02-09 ENCOUNTER — Ambulatory Visit
Admission: RE | Admit: 2019-02-09 | Discharge: 2019-02-09 | Disposition: A | Discharge status: Home / Self Care | Payer: Medicaid Other | Source: Ambulatory Visit | Attending: Student | Admitting: Student

## 2019-02-09 DIAGNOSIS — K5909 Other constipation: Secondary | ICD-10-CM | POA: Insufficient documentation

## 2019-02-09 DIAGNOSIS — R1010 Upper abdominal pain, unspecified: Secondary | ICD-10-CM | POA: Diagnosis not present

## 2019-02-09 MED ORDER — IOHEXOL 300 MG/ML  SOLN
100.0000 mL | Freq: Once | INTRAMUSCULAR | Status: AC | PRN
  Administered 2019-02-09: 100 mL via INTRAVENOUS

## 2019-02-11 ENCOUNTER — Other Ambulatory Visit: Payer: Self-pay | Admitting: Student

## 2019-02-11 DIAGNOSIS — R630 Anorexia: Secondary | ICD-10-CM

## 2019-02-11 DIAGNOSIS — R1010 Upper abdominal pain, unspecified: Secondary | ICD-10-CM

## 2019-02-21 ENCOUNTER — Ambulatory Visit
Admission: EM | Admit: 2019-02-21 | Discharge: 2019-02-21 | Disposition: A | Discharge status: Home / Self Care | Payer: Medicaid Other | Attending: Orthopedic Surgery | Admitting: Orthopedic Surgery

## 2019-02-21 ENCOUNTER — Other Ambulatory Visit: Payer: Self-pay

## 2019-02-21 ENCOUNTER — Encounter: Payer: Self-pay | Admitting: Emergency Medicine

## 2019-02-21 DIAGNOSIS — R609 Edema, unspecified: Secondary | ICD-10-CM | POA: Diagnosis not present

## 2019-02-21 DIAGNOSIS — M7989 Other specified soft tissue disorders: Secondary | ICD-10-CM | POA: Diagnosis not present

## 2019-02-21 LAB — COMPREHENSIVE METABOLIC PANEL
ALT: 27 U/L (ref 0–44)
AST: 21 U/L (ref 15–41)
Albumin: 4 g/dL (ref 3.5–5.0)
Alkaline Phosphatase: 52 U/L (ref 38–126)
Anion gap: 8 (ref 5–15)
BUN: 6 mg/dL (ref 6–20)
CO2: 24 mmol/L (ref 22–32)
Calcium: 8.5 mg/dL — ABNORMAL LOW (ref 8.9–10.3)
Chloride: 109 mmol/L (ref 98–111)
Creatinine, Ser: 0.65 mg/dL (ref 0.44–1.00)
GFR calc Af Amer: >60 mL/min (ref >60)
GFR calc non Af Amer: >60 mL/min (ref >60)
Glucose, Bld: 98 mg/dL (ref 70–99)
Potassium: 3.9 mmol/L (ref 3.5–5.1)
Sodium: 141 mmol/L (ref 135–145)
Total Bilirubin: 0.5 mg/dL (ref 0.3–1.2)
Total Protein: 6.6 g/dL (ref 6.5–8.1)

## 2019-02-21 LAB — CBC
HCT: 33.8 % — ABNORMAL LOW (ref 36.0–46.0)
Hemoglobin: 11.8 g/dL — ABNORMAL LOW (ref 12.0–15.0)
MCH: 31.5 pg (ref 26.0–34.0)
MCHC: 34.9 g/dL (ref 30.0–36.0)
MCV: 90.1 fL (ref 80.0–100.0)
Platelets: 176 10*3/uL (ref 150–400)
RBC: 3.75 MIL/uL — ABNORMAL LOW (ref 3.87–5.11)
RDW: 12.8 % (ref 11.5–15.5)
WBC: 5.2 10*3/uL (ref 4.0–10.5)
nRBC: 0 % (ref 0.0–0.2)

## 2019-02-21 NOTE — ED Triage Notes (Signed)
Patient c/o pain and swelling in both feet that started on Tuesday.  Patient denies injury or fall.  Patient denies fevers.

## 2019-02-21 NOTE — Discharge Instructions (Addendum)
Low-sodium diet, wear compression stockings.  Rest and elevate lower extremities above the heart.  Follow-up with PCP, if any worsening symptoms, fevers, warmth, redness, chest pain, shortness of breath, increasing pain or swelling return to the ER or urgent care facility.

## 2019-02-21 NOTE — ED Provider Notes (Signed)
[facility]    CSN: [id number] Arrival date & time: 02/21/19  1149     History   Chief Complaint Chief Complaint  Patient presents with  . Foot Swelling    bilateral    HPI Amber Campos is a 43 y.o. female presents to the urgent care facility for evaluation of bilateral leg and ankle swelling.  Symptoms have been present for 5 days.  No trauma or injury.  She is been working slightly more than normal, 6 days a week 12-hour shifts on her feet most days.  Patient has noted some improvement over the last few days in the morning upon awakening, swelling is worse at the end of the day.  Today, she had more swelling than normal in the morning and was concerned.  She denies any fevers, chest pain, shortness of breath, cough, history of blood clots, recent surgery, history of cancer, hormone use, recent long car rides or flights, smoking, clotting disorders.  No history of CHF, orthopnea.  Patient states she has had occasional cramp in both calves.  She has some mild discomfort and tightness in the right.  Left leg but overall swelling/edema and symmetrical.  She has not tried any compression stockings.  She denies any past medical history except for hypertension, hyperlipidemia.  HPI  Past Medical History:  Diagnosis Date  . Anxiety   . Chronic constipation   . Colon polyp 09/2014  . Depression   . Dyspareunia   . Dysphagia   . GERD (gastroesophageal reflux disease)   . History of chickenpox   . History of recurrent UTI (urinary tract infection)   . HLD (hyperlipidemia)   . HTN (hypertension)   . Hypogammaglobulinemia ([place])   . Migraine   . Perimenopausal atrophic vaginitis   . Urinary frequency     Patient Active Problem List   Diagnosis Date Noted  . Hypogammaglobulinemia ([place]) 03/09/2015  . CONTUSION OF BUTTOCK 08/10/2009  . HEAD TRAUMA, CLOSED 08/10/2009  . CHEST WALL PAIN, ACUTE 08/08/2009  . SHORTNESS OF BREATH 06/05/2009  . CHEST PAIN 05/15/2009  .  ABDOMINAL PAIN OTHER SPECIFIED SITE 05/09/2009  . SINUSITIS- ACUTE-NOS 08/17/2008  . URI 06/23/2008  . TOBACCO USE 05/30/2008  . Depressive disorder, not elsewhere classified 05/30/2008  . ALLERGIC RHINITIS 05/30/2008  . GERD 05/30/2008  . HEADACHE 05/30/2008    Past Surgical History:  Procedure Laterality Date  . ABDOMINAL HYSTERECTOMY  2015   Complete  . ABDOMINAL HYSTERECTOMY    . APPENDECTOMY    . CESAREAN SECTION    . COLONOSCOPY  09/2014   polyp removed  . ENDOMETRIAL ABLATION    . ESOPHAGOGASTRODUODENOSCOPY  09/2014  . KNEE ARTHROSCOPY Bilateral 2011, 2015  . TONSILLECTOMY AND ADENOIDECTOMY      OB History   No obstetric history on file.      Home Medications    Prior to Admission medications   Medication Sig Start Date End Date Taking? Authorizing Provider  clonazePAM (KLONOPIN) 1 MG tablet Take 1 mg by mouth 2 (two) times daily.  10/01/13  Yes [provider]  DULoxetine (CYMBALTA) 30 MG capsule Take 90 mg by mouth daily.   Yes [provider]  linaclotide (LINZESS) 290 MCG CAPS capsule Take by mouth. 02/11/19  Yes [provider]  metoprolol tartrate (LOPRESSOR) 25 MG tablet Take 25 mg by mouth daily.   Yes [provider]  Multiple Vitamin (MULTIVITAMIN) capsule Take 1 capsule by mouth daily.   Yes [provider]  omeprazole (PRILOSEC) 20 MG capsule Take 20 mg by mouth daily.   Yes [provider]  Rosuvastatin Calcium 10 MG CPSP  10/11/18  Yes [provider]  TRAZODONE HCL PO Take by mouth.   Yes [provider]  dicyclomine (BENTYL) 10 MG capsule Take 1 capsule (10 mg total) by mouth 4 (four) times daily. 01/17/17 01/31/17  Rebecka ApleyWebster, Allison P, MD  ondansetron (ZOFRAN ODT) 4 MG disintegrating tablet Take 1 tablet (4 mg total) by mouth every 8 (eight) hours as needed for nausea or vomiting. 01/17/17   Rebecka ApleyWebster, Allison P, MD    Family History Family History  Problem Relation Age of Onset  .  Hypertension Mother   . Hyperlipidemia Mother   . Diabetes Mother   . Nephrolithiasis Mother        Father  . Stroke Mother   . Heart disease Mother   . Colon polyps Mother   . Hypertension Father   . Hyperlipidemia Father   . Transient ischemic attack Father   . Prostate cancer Paternal Grandfather   . Deep vein thrombosis Other     Social History Social History   Tobacco Use  . Smoking status: Former Smoker    Packs/day: 0.25    Years: 20.00    Pack years: 5.00    Types: Cigarettes  . Smokeless tobacco: Never Used  Substance Use Topics  . Alcohol use: Yes    Alcohol/week: 0.0 standard drinks    Comment: occasional wine an dliquor  . Drug use: No     Allergies   Amoxicillin and Other   Review of Systems Review of Systems  Constitutional: Negative for activity change, chills, fatigue and fever.  HENT: Negative for congestion, sinus pressure and sore throat.   Eyes: Negative for visual disturbance.  Respiratory: Negative for cough, chest tightness and shortness of breath.   Cardiovascular: Positive for leg swelling. Negative for chest pain.  Gastrointestinal: Negative for abdominal pain, diarrhea, nausea and vomiting.  Genitourinary: Negative for dysuria.  Musculoskeletal: Negative for arthralgias, gait problem and myalgias.  Skin: Negative for rash.  Neurological: Negative for weakness, numbness and headaches.  Hematological: Negative for adenopathy.  Psychiatric/Behavioral: Negative for agitation, behavioral problems and confusion.     Physical Exam Triage Vital Signs ED Triage Vitals  Enc Vitals Group     BP 02/21/19 1301 105/76     Pulse Rate 02/21/19 1301 77     Resp 02/21/19 1301 16     Temp 02/21/19 1301 98 F (36.7 C)     Temp Source 02/21/19 1301 Oral     SpO2 02/21/19 1301 98 %     Weight 02/21/19 1257 213 lb (96.6 kg)     Height 02/21/19 1257 5\' 4"  (1.626 m)     Head Circumference --      Peak Flow --      Pain Score 02/21/19 1257 4      Pain Loc --      Pain Edu? --      Excl. in GC? --    No data found.  Updated Vital Signs BP 105/76 (BP Location: Right Arm)   Pulse 77   Temp 98 F (36.7 C) (Oral)   Resp 16   Ht 5\' 4"  (1.626 m)   Wt 213 lb (96.6 kg)   SpO2 98%   BMI 36.56 kg/m   Visual Acuity Right Eye Distance:   Left Eye Distance:   Bilateral Distance:    Right Eye Near:  Left Eye Near:    Bilateral Near:     Physical Exam Constitutional:      Appearance: She is well-developed.  HENT:     Head: Normocephalic and atraumatic.  Eyes:     Conjunctiva/sclera: Conjunctivae normal.  Neck:     Musculoskeletal: Normal range of motion.  Cardiovascular:     Rate and Rhythm: Normal rate and regular rhythm.  Pulmonary:     Effort: Pulmonary effort is normal. No respiratory distress.     Breath sounds: Normal breath sounds. No wheezing.  Musculoskeletal: Normal range of motion.     Comments: Ambulatory with no antalgic gait, no assistive devices.  Mild peripheral edema from the ankle.  Proximal third of the tib-fib region bilaterally.  Right side slightly greater than left.  Negative Homans sign bilaterally.  Nervous intact in bilateral lower extremities.  No warmth, redness or skin breakdown noted.  No signs of cellulitis.  2+ dorsalis pedis pulses bilaterally.  Compartments are soft.  Skin:    General: Skin is warm.     Findings: No rash.  Neurological:     Mental Status: She is alert and oriented to person, place, and time.  Psychiatric:        Behavior: Behavior normal.        Thought Content: Thought content normal.      UC Treatments / Results  Labs (all labs ordered are listed, but only abnormal results are displayed) Labs Reviewed  CBC - Abnormal; Notable for the following components:      Result Value   RBC 3.75 (*)    Hemoglobin 11.8 (*)    HCT 33.8 (*)    All other components within normal limits  COMPREHENSIVE METABOLIC PANEL - Abnormal; Notable for the following components:    Calcium 8.5 (*)    All other components within normal limits    EKG   Radiology No results found.  Procedures Procedures (including critical care time)  Medications Ordered in UC Medications - No data to display  Initial Impression / Assessment and Plan / UC Course  I have reviewed the triage vital signs and the nursing notes.  Pertinent labs & imaging results that were available during my care of the patient were reviewed by me and considered in my medical decision making (see chart for details).     43 year old female with mild peripheral edema.  Patient is low risk for DVT, DVT unlikely.  Patient encouraged to wear compression stockings, elevate her lower extremities.  She will follow-up with PCP if no improvement.  She understands signs and symptoms return to the ED for.  CBC CMP normal today. Final Clinical Impressions(s) / UC Diagnoses   Final diagnoses:  Swelling of both lower extremities  Edema, peripheral     Discharge Instructions     Low-sodium diet, wear compression stockings.  Rest and elevate lower extremities above the heart.  Follow-up with PCP, if any worsening symptoms, fevers, warmth, redness, chest pain, shortness of breath, increasing pain or swelling return to the ER or urgent care facility.    ED Prescriptions    None        Evon SlackGaines, Thomas C, [place] JerseyPA-C 02/21/19 1402

## 2019-02-24 ENCOUNTER — Ambulatory Visit: Admission: RE | Admit: 2019-02-24 | Payer: Medicaid Other | Source: Ambulatory Visit

## 2019-03-04 ENCOUNTER — Ambulatory Visit: Payer: Medicaid Other

## 2019-04-26 ENCOUNTER — Encounter: Payer: Medicaid Other | Attending: Student

## 2020-05-19 ENCOUNTER — Other Ambulatory Visit (HOSPITAL_COMMUNITY): Payer: Self-pay | Admitting: Physician Assistant

## 2020-05-19 NOTE — Progress Notes (Signed)
I connected by phone with Amber Campos on 05/19/2020 at 2:08 PM to discuss the potential use of a new treatment for mild to moderate COVID-19 viral infection in non-hospitalized patients.  This patient is a 44 y.o. female that meets the FDA criteria for Emergency Use Authorization of COVID monoclonal antibody casirivimab/imdevimab or bamlanivimab/eteseviamb.  Has a (+) direct SARS-CoV-2 viral test result  Has mild or moderate COVID-19   Is NOT hospitalized due to COVID-19  Is within 10 days of symptom onset  Has at least one of the high risk factor(s) for progression to severe COVID-19 and/or hospitalization as defined in EUA.  Specific high risk criteria : BMI > 25, Immunosuppressive Disease or Treatment and Cardiovascular disease or hypertension   I have spoken and communicated the following to the patient or parent/caregiver regarding COVID monoclonal antibody treatment:  1. FDA has authorized the emergency use for the treatment of mild to moderate COVID-19 in adults and pediatric patients with positive results of direct SARS-CoV-2 viral testing who are 26 years of age and older weighing at least 40 kg, and who are at high risk for progressing to severe COVID-19 and/or hospitalization.  2. The significant known and potential risks and benefits of COVID monoclonal antibody, and the extent to which such potential risks and benefits are unknown.  3. Information on available alternative treatments and the risks and benefits of those alternatives, including clinical trials.  4. Patients treated with COVID monoclonal antibody should continue to self-isolate and use infection control measures (e.g., wear mask, isolate, social distance, avoid sharing personal items, clean and disinfect "high touch" surfaces, and frequent handwashing) according to CDC guidelines.   5. The patient or parent/caregiver has the option to accept or refuse COVID monoclonal antibody treatment.  After reviewing this  information with the patient, the patient has agreed to receive one of the available covid 19 monoclonal antibodies and will be provided an appropriate fact sheet prior to infusion. Jodell Cipro, PA-C 05/19/2020 2:08 PM

## 2020-05-20 ENCOUNTER — Ambulatory Visit (HOSPITAL_COMMUNITY)
Admission: RE | Admit: 2020-05-20 | Discharge: 2020-05-20 | Disposition: A | Discharge status: Home / Self Care | Payer: Medicaid Other | Source: Ambulatory Visit | Attending: Pulmonary Disease | Admitting: Pulmonary Disease

## 2020-05-20 DIAGNOSIS — U071 COVID-19: Secondary | ICD-10-CM | POA: Diagnosis not present

## 2020-05-20 MED ORDER — FAMOTIDINE IN NACL 20-0.9 MG/50ML-% IV SOLN: 20.0000 mg | Freq: Once | INTRAVENOUS | Status: DC | PRN

## 2020-05-20 MED ORDER — ALBUTEROL SULFATE HFA 108 (90 BASE) MCG/ACT IN AERS: 2.0000 | INHALATION_SPRAY | Freq: Once | RESPIRATORY_TRACT | Status: DC | PRN

## 2020-05-20 MED ORDER — METHYLPREDNISOLONE SODIUM SUCC 125 MG IJ SOLR: 125.0000 mg | Freq: Once | INTRAMUSCULAR | Status: DC | PRN

## 2020-05-20 MED ORDER — DIPHENHYDRAMINE HCL 50 MG/ML IJ SOLN: 50.0000 mg | Freq: Once | INTRAMUSCULAR | Status: DC | PRN

## 2020-05-20 MED ORDER — EPINEPHRINE 0.3 MG/0.3ML IJ SOAJ: 0.3000 mg | Freq: Once | INTRAMUSCULAR | Status: DC | PRN

## 2020-05-20 MED ORDER — SODIUM CHLORIDE 0.9 % IV SOLN: INTRAVENOUS | Status: DC | PRN

## 2020-05-20 MED ORDER — SODIUM CHLORIDE 0.9 % IV SOLN: Freq: Once | INTRAVENOUS | Status: AC

## 2020-05-20 NOTE — Progress Notes (Signed)
  Diagnosis: COVID-19  Physician: Dr. Wright   Procedure: [facility] Infusion [facility] Med: casirivimab\imdevimab infusion - Provided patient with casirivimab\imdevimab fact sheet for patients, parents and caregivers prior to infusion.  Complications: No immediate complications noted.  Discharge: Discharged home   Dellar Traber  B Karthik Whittinghill 05/20/2020   

## 2020-05-20 NOTE — Discharge Instructions (Signed)

## 2020-08-27 ENCOUNTER — Other Ambulatory Visit: Payer: Self-pay | Admitting: Family Medicine

## 2020-08-27 DIAGNOSIS — Z1231 Encounter for screening mammogram for malignant neoplasm of breast: Secondary | ICD-10-CM

## 2020-09-12 ENCOUNTER — Inpatient Hospital Stay: Admission: RE | Admit: 2020-09-12 | Payer: Medicaid Other | Source: Ambulatory Visit

## 2020-10-05 ENCOUNTER — Other Ambulatory Visit: Payer: Self-pay

## 2020-10-05 ENCOUNTER — Ambulatory Visit
Admission: RE | Admit: 2020-10-05 | Discharge: 2020-10-05 | Disposition: A | Discharge status: Home / Self Care | Payer: Medicaid Other | Source: Ambulatory Visit | Attending: Family Medicine | Admitting: Family Medicine

## 2020-10-05 DIAGNOSIS — Z1231 Encounter for screening mammogram for malignant neoplasm of breast: Secondary | ICD-10-CM | POA: Diagnosis not present

## 2020-12-29 ENCOUNTER — Other Ambulatory Visit: Payer: Self-pay

## 2020-12-29 ENCOUNTER — Encounter: Payer: Self-pay | Admitting: Emergency Medicine

## 2020-12-29 ENCOUNTER — Ambulatory Visit
Admission: EM | Admit: 2020-12-29 | Discharge: 2020-12-29 | Disposition: A | Discharge status: Home / Self Care | Payer: Medicaid Other | Attending: Family Medicine | Admitting: Family Medicine

## 2020-12-29 DIAGNOSIS — J040 Acute laryngitis: Secondary | ICD-10-CM | POA: Diagnosis not present

## 2020-12-29 DIAGNOSIS — J0100 Acute maxillary sinusitis, unspecified: Secondary | ICD-10-CM

## 2020-12-29 MED ORDER — PROMETHAZINE-DM 6.25-15 MG/5ML PO SYRP: 5.0000 mL | ORAL_SOLUTION | Freq: Four times a day (QID) | ORAL | 0 refills | Status: DC | PRN

## 2020-12-29 MED ORDER — DOXYCYCLINE HYCLATE 100 MG PO CAPS: 100.0000 mg | ORAL_CAPSULE | Freq: Two times a day (BID) | ORAL | 0 refills | Status: DC

## 2020-12-29 NOTE — ED Provider Notes (Signed)
[facility]    CSN: [id number] Arrival date & time: 12/29/20  1003      History   Chief Complaint Chief Complaint  Patient presents with  . Cough    Loss of voice    HPI  45 year old female presents with the above complaint.  Patient reports respiratory symptoms over the past week.  Patient has a history of recurrent sinusitis.  She reports cough, sinus pain and pressure particular of the left maxillary region.  Also reports hoarseness.  No fever.  No relieving factors.  No other complaints.  Past Medical History:  Diagnosis Date  . Anxiety   . Chronic constipation   . Colon polyp 09/2014  . Depression   . Dyspareunia   . Dysphagia   . GERD (gastroesophageal reflux disease)   . History of chickenpox   . History of recurrent UTI (urinary tract infection)   . HLD (hyperlipidemia)   . HTN (hypertension)   . Hypogammaglobulinemia (HCC)   . Migraine   . Perimenopausal atrophic vaginitis   . Urinary frequency     Patient Active Problem List   Diagnosis Date Noted  . 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 CONTUSION OF BUTTOCK 08/10/2009  . HEAD TRAUMA, CLOSED 08/10/2009  . CHEST WALL PAIN, ACUTE 08/08/2009  . SHORTNESS OF BREATH 06/05/2009  . CHEST PAIN 05/15/2009  . ABDOMINAL PAIN OTHER SPECIFIED SITE 05/09/2009  . SINUSITIS- ACUTE-NOS 08/17/2008  . URI 06/23/2008  . TOBACCO USE 05/30/2008  . Depressive disorder, not elsewhere classified 05/30/2008  . ALLERGIC RHINITIS 05/30/2008  . GERD 05/30/2008  . HEADACHE 05/30/2008    Past Surgical History:  Procedure Laterality Date  . ABDOMINAL HYSTERECTOMY  2015   Complete  . ABDOMINAL HYSTERECTOMY    . APPENDECTOMY    . CESAREAN SECTION    . COLONOSCOPY  09/2014   polyp removed  . ENDOMETRIAL ABLATION    . ESOPHAGOGASTRODUODENOSCOPY  09/2014  . KNEE ARTHROSCOPY Bilateral 2011, 2015  . OOPHORECTOMY    . TONSILLECTOMY AND ADENOIDECTOMY      OB History   No obstetric history on file.       Home Medications    Prior to Admission medications   Medication Sig Start Date End Date Taking? Authorizing Provider  doxycycline (VIBRAMYCIN) 100 MG capsule Take 1 capsule (100 mg total) by mouth 2 (two) times daily. 12/29/20  Yes Dohn Stclair G, DO  promethazine-dextromethorphan (PROMETHAZINE-DM) 6.25-15 MG/5ML syrup Take 5 mLs by mouth 4 (four) times daily as needed for cough. 12/29/20  Yes Verenise Moulin G, DO  clonazePAM (KLONOPIN) 1 MG tablet Take 1 mg by mouth 2 (two) times daily.  10/01/13   [provider]  dicyclomine (BENTYL) 10 MG capsule Take 1 capsule (10 mg total) by mouth 4 (four) times daily. 01/17/17 01/31/17  Rebecka Apley, MD  DULoxetine (CYMBALTA) 30 MG capsule Take 90 mg by mouth daily.    [provider]  metoprolol tartrate (LOPRESSOR) 25 MG tablet Take 25 mg by mouth daily.    [provider]  Multiple Vitamin (MULTIVITAMIN) capsule Take 1 capsule by mouth daily.    [provider]  omeprazole (PRILOSEC) 20 MG capsule Take 20 mg by mouth daily.    [provider]  Rosuvastatin Calcium 10 MG CPSP  10/11/18   [provider]  TRAZODONE HCL PO Take by mouth.    [provider]  linaclotide (LINZESS) 290 MCG CAPS capsule Take by mouth. 02/11/19 12/29/20  [provider]  Family History Family History  Problem Relation Age of Onset  . Hypertension Mother   . Hyperlipidemia Mother   . Diabetes Mother   . Nephrolithiasis Mother        Father  . Stroke Mother   . Heart disease Mother   . Colon polyps Mother   . Hypertension Father   . Hyperlipidemia Father   . Transient ischemic attack Father   . Prostate cancer Paternal Grandfather   . Deep vein thrombosis Other   . Breast cancer Maternal Aunt     Social History Social History   Tobacco Use  . Smoking status: Former Smoker    Packs/day: 0.25    Years: 20.00    Pack years: 5.00    Types: Cigarettes  . Smokeless tobacco: Never Used   Vaping Use  . Vaping Use: Every day  Substance Use Topics  . Alcohol use: Yes    Alcohol/week: 0.0 standard drinks    Comment: occasional wine an dliquor  . Drug use: No     Allergies   Amoxicillin and Other   Review of Systems Review of Systems Per HPI  Physical Exam Triage Vital Signs ED Triage Vitals  Enc Vitals Group     BP 12/29/20 1041 137/89     Pulse Rate 12/29/20 1041 80     Resp 12/29/20 1041 18     Temp 12/29/20 1041 98.4 F (36.9 C)     Temp Source 12/29/20 1041 Oral     SpO2 12/29/20 1041 100 %     Weight --      Height --      Head Circumference --      Peak Flow --      Pain Score 12/29/20 1038 0     Pain Loc --      Pain Edu? --      Excl. in GC? --    No data found.  Updated Vital Signs BP 137/89 (BP Location: Left Arm)   Pulse 80   Temp 98.4 F (36.9 C) (Oral)   Resp 18   SpO2 100%   Visual Acuity Right Eye Distance:   Left Eye Distance:   Bilateral Distance:    Right Eye Near:   Left Eye Near:    Bilateral Near:     Physical Exam Constitutional:      General: She is not in acute distress.    Appearance: Normal appearance.  HENT:     Head: Normocephalic and atraumatic.     Right Ear: Tympanic membrane normal.     Left Ear: Tympanic membrane normal.     Nose: Congestion present.  Eyes:     General:        Right eye: No discharge.        Left eye: No discharge.     Conjunctiva/sclera: Conjunctivae normal.  Cardiovascular:     Rate and Rhythm: Normal rate and regular rhythm.  Pulmonary:     Effort: Pulmonary effort is normal.     Breath sounds: Normal breath sounds. No wheezing or rales.  Neurological:     Mental Status: She is alert.  Psychiatric:        Mood and Affect: Mood normal.        Behavior: Behavior normal.    UC Treatments / Results  Labs (all labs ordered are listed, but only abnormal results are displayed) Labs Reviewed - No data to display  EKG   Radiology No results  found.  Procedures  Procedures (including critical care time)  Medications Ordered in UC Medications - No data to display  Initial Impression / Assessment and Plan / UC Course  I have reviewed the triage vital signs and the nursing notes.  Pertinent labs & imaging results that were available during my care of the patient were reviewed by me and considered in my medical decision making (see chart for details).    45 year old female presents with left maxillary sinusitis.  Patient also experiencing laryngitis.  Patient is trying to see ENT given recurrent issues.  Placing on doxycycline.  Promethazine DM for cough.  Final Clinical Impressions(s) / UC Diagnoses   Final diagnoses:  Acute maxillary sinusitis, recurrence not specified  Laryngitis     Discharge Instructions     Rest. Fluids.  Medication as prescribed.  Take care  Dr. Adriana Simas    ED Prescriptions    Medication Sig Dispense Auth. Provider   doxycycline (VIBRAMYCIN) 100 MG capsule Take 1 capsule (100 mg total) by mouth 2 (two) times daily. 14 capsule Eve Rey G, DO   promethazine-dextromethorphan (PROMETHAZINE-DM) 6.25-15 MG/5ML syrup Take 5 mLs by mouth 4 (four) times daily as needed for cough. 118 mL Tommie Sams, DO     PDMP not reviewed this encounter.   Tommie Sams, [place] 12/29/20 1211

## 2020-12-29 NOTE — ED Triage Notes (Signed)
Pt is present today with a cough, sinus pressure, and nasal congestion. Pt states that her cough started this morning.

## 2020-12-29 NOTE — Discharge Instructions (Signed)
Rest. Fluids.  Medication as prescribed.   Take care  Dr. Alisha Burgo  

## 2021-02-05 ENCOUNTER — Ambulatory Visit
Admission: EM | Admit: 2021-02-05 | Discharge: 2021-02-05 | Disposition: A | Discharge status: Home / Self Care | Payer: Medicaid Other | Attending: Emergency Medicine | Admitting: Emergency Medicine

## 2021-02-05 ENCOUNTER — Other Ambulatory Visit: Payer: Self-pay

## 2021-02-05 DIAGNOSIS — S39012A Strain of muscle, fascia and tendon of lower back, initial encounter: Secondary | ICD-10-CM

## 2021-02-05 MED ORDER — BACLOFEN 10 MG PO TABS: 10.0000 mg | ORAL_TABLET | Freq: Three times a day (TID) | ORAL | 0 refills | Status: DC

## 2021-02-05 MED ORDER — IBUPROFEN 600 MG PO TABS: 600.0000 mg | ORAL_TABLET | Freq: Four times a day (QID) | ORAL | 0 refills | Status: DC | PRN

## 2021-02-05 NOTE — ED Triage Notes (Signed)
Patient complains of right low back pain since 1 week. States that pain occasionally radiates down back of her leg.

## 2021-02-05 NOTE — Discharge Instructions (Addendum)
Take the ibuprofen, 600 mg every 6 hours with food, on a schedule for the next 48 hours and then as needed.  Take the Baclofen 10 mg every 8 hours, on a schedule for the next 48 hours and then as needed.  Apply moist heat to your back for 30 minutes at a time 2-3 times a day to improve blood flow to the area and help remove the lactic acid causing the spasm.  Follow the back exercises given at discharge.  Return for reevaluation for any new or worsening symptoms.

## 2021-02-05 NOTE — ED Provider Notes (Signed)
[facility]    CSN: [id number] Arrival date & time: 02/05/21  1139      History   Chief Complaint Chief Complaint  Patient presents with   Back Pain    HPI Amber Campos is a 45 y.o. female.   HPI  45 year old female here for evaluation of low back pain.  Patient reports that she has been experiencing pain on the right side of her lower back for the last week.  The pain will occasionally radiate down into her right leg.  She denies numbness, tingling, weakness, or loss of bowel or bladder control.  She reports that she felt a pull when she lifted her adult daughter up while they were horseplaying.  The pain increases with rotation, lateral flexion, frontal flexion, and extension.  Past Medical History:  Diagnosis Date   Anxiety    Chronic constipation    Colon polyp 09/2014   Depression    Dyspareunia    Dysphagia    GERD (gastroesophageal reflux disease)    History of chickenpox    History of recurrent UTI (urinary tract infection)    HLD (hyperlipidemia)    HTN (hypertension)    Hypogammaglobulinemia (HCC)    Migraine    Perimenopausal atrophic vaginitis    Urinary frequency     Patient Active Problem List   Diagnosis Date Noted   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   CONTUSION OF BUTTOCK 08/10/2009   HEAD TRAUMA, CLOSED 08/10/2009   CHEST WALL PAIN, ACUTE 08/08/2009   SHORTNESS OF BREATH 06/05/2009   CHEST PAIN 05/15/2009   ABDOMINAL PAIN OTHER SPECIFIED SITE 05/09/2009   SINUSITIS- ACUTE-NOS 08/17/2008   URI 06/23/2008   TOBACCO USE 05/30/2008   Depressive disorder, not elsewhere classified 05/30/2008   ALLERGIC RHINITIS 05/30/2008   GERD 05/30/2008   HEADACHE 05/30/2008    Past Surgical History:  Procedure Laterality Date   ABDOMINAL HYSTERECTOMY  2015   Complete   ABDOMINAL HYSTERECTOMY     APPENDECTOMY     CESAREAN SECTION     COLONOSCOPY  09/2014   polyp removed   ENDOMETRIAL ABLATION     ESOPHAGOGASTRODUODENOSCOPY  09/2014    KNEE ARTHROSCOPY Bilateral 2011, 2015   OOPHORECTOMY     TONSILLECTOMY AND ADENOIDECTOMY      OB History   No obstetric history on file.      Home Medications    Prior to Admission medications   Medication Sig Start Date End Date Taking? Authorizing Provider  baclofen (LIORESAL) 10 MG tablet Take 1 tablet (10 mg total) by mouth 3 (three) times daily. 02/05/21  Yes Becky Augusta, NP  clonazePAM (KLONOPIN) 1 MG tablet Take 1 mg by mouth 2 (two) times daily.  10/01/13  Yes [provider]  DULoxetine (CYMBALTA) 30 MG capsule Take 90 mg by mouth daily.   Yes [provider]  ibuprofen (ADVIL) 600 MG tablet Take 1 tablet (600 mg total) by mouth every 6 (six) hours as needed. 02/05/21  Yes Becky Augusta, NP  metoprolol tartrate (LOPRESSOR) 25 MG tablet Take 25 mg by mouth daily.   Yes [provider]  Multiple Vitamin (MULTIVITAMIN) capsule Take 1 capsule by mouth daily.   Yes [provider]  omeprazole (PRILOSEC) 20 MG capsule Take 20 mg by mouth daily.   Yes [provider]  Rosuvastatin Calcium 10 MG CPSP  10/11/18  Yes [provider]  TRAZODONE HCL PO Take by mouth.   Yes [provider]  linaclotide Karlene Einstein) 290 MCG  CAPS capsule Take by mouth. 02/11/19 12/29/20  [provider]    Family History Family History  Problem Relation Age of Onset   Hypertension Mother    Hyperlipidemia Mother    Diabetes Mother    Nephrolithiasis Mother        Father   Stroke Mother    Heart disease Mother    Colon polyps Mother    Hypertension Father    Hyperlipidemia Father    Transient ischemic attack Father    Prostate cancer Paternal Grandfather    Deep vein thrombosis Other    Breast cancer Maternal Aunt     Social History Social History   Tobacco Use   Smoking status: Former    Packs/day: 0.25    Years: 20.00    Pack years: 5.00    Types: Cigarettes   Smokeless tobacco: Never  Vaping Use   Vaping Use: Every  day  Substance Use Topics   Alcohol use: Yes    Alcohol/week: 0.0 standard drinks    Comment: occasional wine an dliquor   Drug use: No     Allergies   Amoxicillin and Other   Review of Systems Review of Systems  Musculoskeletal:  Positive for back pain. Negative for gait problem.  Neurological:  Negative for weakness and numbness.  Hematological: Negative.   Psychiatric/Behavioral: Negative.      Physical Exam Triage Vital Signs ED Triage Vitals  Enc Vitals Group     BP 02/05/21 1327 132/88     Pulse Rate 02/05/21 1327 74     Resp 02/05/21 1327 16     Temp 02/05/21 1327 98.8 F (37.1 C)     Temp Source 02/05/21 1327 Oral     SpO2 02/05/21 1327 97 %     Weight 02/05/21 1325 200 lb (90.7 kg)     Height 02/05/21 1325 5\' 4"  (1.626 m)     Head Circumference --      Peak Flow --      Pain Score 02/05/21 1324 8     Pain Loc --      Pain Edu? --      Excl. in GC? --    No data found.  Updated Vital Signs BP 132/88 (BP Location: Right Arm)   Pulse 74   Temp 98.8 F (37.1 C) (Oral)   Resp 16   Ht 5\' 4"  (1.626 m)   Wt 200 lb (90.7 kg)   SpO2 97%   BMI 34.33 kg/m   Visual Acuity Right Eye Distance:   Left Eye Distance:   Bilateral Distance:    Right Eye Near:   Left Eye Near:    Bilateral Near:     Physical Exam Vitals and nursing note reviewed.  Constitutional:      General: She is not in acute distress.    Appearance: Normal appearance. She is not ill-appearing.  HENT:     Head: Normocephalic and atraumatic.  Musculoskeletal:        General: Tenderness present. No swelling or deformity. Normal range of motion.  Skin:    General: Skin is warm and dry.     Capillary Refill: Capillary refill takes less than 2 seconds.     Findings: No erythema.  Neurological:     General: No focal deficit present.     Mental Status: She is alert and oriented to person, place, and time.     Sensory: No sensory deficit.     Motor: No weakness.  Psychiatric:  Mood and Affect: Mood normal.        Behavior: Behavior normal.        Thought Content: Thought content normal.        Judgment: Judgment normal.     UC Treatments / Results  Labs (all labs ordered are listed, but only abnormal results are displayed) Labs Reviewed - No data to display  EKG   Radiology No results found.  Procedures Procedures (including critical care time)  Medications Ordered in UC Medications - No data to display  Initial Impression / Assessment and Plan / UC Course  I have reviewed the triage vital signs and the nursing notes.  Pertinent labs & imaging results that were available during my care of the patient were reviewed by me and considered in my medical decision making (see chart for details).  Patient is a very pleasant 52 old female here for evaluation of right-sided low back pain as outlined in the HPI above.  Patient's physical exam reveals no midline spinal tenderness.  There is also no tenderness to the left or right in the paraspinous region of the spine.  Patient does have tenderness at the superior aspect of her right gluteus that travels down into the right buttock.  No sciatic involvement.  Lower extremity strength is 5/5 bilaterally as are lower extremity DTRs.  Patient's exam is consistent with a lumbar strain and will treat with ibuprofen and baclofen.  I have also advised the patient to follow the back exercises in her discharge paperwork, use moist heat, and consider massage therapy.   Final Clinical Impressions(s) / UC Diagnoses   Final diagnoses:  Strain of lumbar region, initial encounter     Discharge Instructions      Take the ibuprofen, 600 mg every 6 hours with food, on a schedule for the next 48 hours and then as needed.  Take the Baclofen 10 mg every 8 hours, on a schedule for the next 48 hours and then as needed.  Apply moist heat to your back for 30 minutes at a time 2-3 times a day to improve blood flow to the area and  help remove the lactic acid causing the spasm.  Follow the back exercises given at discharge.  Return for reevaluation for any new or worsening symptoms.      ED Prescriptions     Medication Sig Dispense Auth. Provider   ibuprofen (ADVIL) 600 MG tablet Take 1 tablet (600 mg total) by mouth every 6 (six) hours as needed. 30 tablet Becky Augusta, NP   baclofen (LIORESAL) 10 MG tablet Take 1 tablet (10 mg total) by mouth 3 (three) times daily. 30 each Becky Augusta, NP      PDMP not reviewed this encounter.   Becky Augusta, NP 02/05/21 1351

## 2021-02-28 ENCOUNTER — Emergency Department
Admission: EM | Admit: 2021-02-28 | Discharge: 2021-02-28 | Disposition: A | Discharge status: Home / Self Care | Payer: Medicaid Other | Attending: Emergency Medicine | Admitting: Emergency Medicine

## 2021-02-28 ENCOUNTER — Emergency Department: Payer: Medicaid Other

## 2021-02-28 DIAGNOSIS — Y92009 Unspecified place in unspecified non-institutional (private) residence as the place of occurrence of the external cause: Secondary | ICD-10-CM | POA: Diagnosis not present

## 2021-02-28 DIAGNOSIS — Z23 Encounter for immunization: Secondary | ICD-10-CM | POA: Diagnosis not present

## 2021-02-28 DIAGNOSIS — M7918 Myalgia, other site: Secondary | ICD-10-CM

## 2021-02-28 DIAGNOSIS — S61411A Laceration without foreign body of right hand, initial encounter: Secondary | ICD-10-CM | POA: Diagnosis not present

## 2021-02-28 DIAGNOSIS — Z87891 Personal history of nicotine dependence: Secondary | ICD-10-CM | POA: Diagnosis not present

## 2021-02-28 DIAGNOSIS — S6991XA Unspecified injury of right wrist, hand and finger(s), initial encounter: Secondary | ICD-10-CM | POA: Diagnosis present

## 2021-02-28 DIAGNOSIS — W108XXA Fall (on) (from) other stairs and steps, initial encounter: Secondary | ICD-10-CM | POA: Diagnosis not present

## 2021-02-28 MED ORDER — NAPROXEN 500 MG PO TABS: 500.0000 mg | ORAL_TABLET | Freq: Two times a day (BID) | ORAL | 0 refills | Status: DC

## 2021-02-28 MED ORDER — LIDOCAINE-EPINEPHRINE-TETRACAINE (LET) TOPICAL GEL
3.0000 mL | Freq: Once | TOPICAL | Status: AC
  Administered 2021-02-28: 3 mL via TOPICAL
  Filled 2021-02-28: qty 3

## 2021-02-28 MED ORDER — BACITRACIN-NEOMYCIN-POLYMYXIN 400-5-5000 EX OINT
TOPICAL_OINTMENT | Freq: Once | CUTANEOUS | Status: DC
  Filled 2021-02-28: qty 1

## 2021-02-28 MED ORDER — SULFAMETHOXAZOLE-TRIMETHOPRIM 800-160 MG PO TABS: 1.0000 | ORAL_TABLET | Freq: Two times a day (BID) | ORAL | 0 refills | Status: DC

## 2021-02-28 MED ORDER — TIZANIDINE HCL 4 MG PO TABS: 4.0000 mg | ORAL_TABLET | Freq: Three times a day (TID) | ORAL | 0 refills | Status: DC

## 2021-02-28 MED ORDER — LIDOCAINE HCL (PF) 1 % IJ SOLN
5.0000 mL | Freq: Once | INTRAMUSCULAR | Status: AC
  Administered 2021-02-28: 5 mL via INTRADERMAL
  Filled 2021-02-28: qty 5

## 2021-02-28 MED ORDER — TETANUS-DIPHTH-ACELL PERTUSSIS 5-2.5-18.5 LF-MCG/0.5 IM SUSY
0.5000 mL | PREFILLED_SYRINGE | Freq: Once | INTRAMUSCULAR | Status: AC
  Administered 2021-02-28: 0.5 mL via INTRAMUSCULAR
  Filled 2021-02-28: qty 0.5

## 2021-02-28 MED ORDER — HYDROCODONE-ACETAMINOPHEN 5-325 MG PO TABS: 1.0000 | ORAL_TABLET | Freq: Four times a day (QID) | ORAL | 0 refills | Status: AC | PRN
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
## 2021-02-28 NOTE — Discharge Instructions (Addendum)
Do not get the sutured area wet for 24 hours. After 24 hours, shower/bathe as usual and pat the area dry. °Change the bandage 2 times per day and apply antibiotic ointment. °Leave open to air when at no risk of getting the area dirty, but cover at night before bed. °See your PCP or go to Urgent Care in 10 days for suture removal or sooner for signs or concern of infection. ° °

## 2021-03-10 ENCOUNTER — Ambulatory Visit: Payer: Medicaid Other

## 2021-07-28 ENCOUNTER — Ambulatory Visit: Payer: Medicaid Other

## 2022-01-26 ENCOUNTER — Emergency Department (HOSPITAL_COMMUNITY): Payer: Medicaid Other

## 2022-01-26 ENCOUNTER — Encounter (HOSPITAL_COMMUNITY): Payer: Self-pay | Admitting: *Deleted

## 2022-01-26 ENCOUNTER — Emergency Department (HOSPITAL_COMMUNITY)
Admission: EM | Admit: 2022-01-26 | Discharge: 2022-01-26 | Disposition: A | Discharge status: Home / Self Care | Payer: Medicaid Other | Attending: Emergency Medicine | Admitting: Emergency Medicine

## 2022-01-26 DIAGNOSIS — S7012XA Contusion of left thigh, initial encounter: Secondary | ICD-10-CM | POA: Diagnosis not present

## 2022-01-26 DIAGNOSIS — Z79899 Other long term (current) drug therapy: Secondary | ICD-10-CM | POA: Insufficient documentation

## 2022-01-26 DIAGNOSIS — S7002XA Contusion of left hip, initial encounter: Secondary | ICD-10-CM | POA: Insufficient documentation

## 2022-01-26 DIAGNOSIS — S7001XA Contusion of right hip, initial encounter: Secondary | ICD-10-CM | POA: Insufficient documentation

## 2022-01-26 DIAGNOSIS — S8002XA Contusion of left knee, initial encounter: Secondary | ICD-10-CM | POA: Diagnosis not present

## 2022-01-26 DIAGNOSIS — I10 Essential (primary) hypertension: Secondary | ICD-10-CM | POA: Insufficient documentation

## 2022-01-26 DIAGNOSIS — S50311A Abrasion of right elbow, initial encounter: Secondary | ICD-10-CM | POA: Diagnosis not present

## 2022-01-26 DIAGNOSIS — Z23 Encounter for immunization: Secondary | ICD-10-CM | POA: Insufficient documentation

## 2022-01-26 DIAGNOSIS — R519 Headache, unspecified: Secondary | ICD-10-CM | POA: Diagnosis not present

## 2022-01-26 DIAGNOSIS — S299XXA Unspecified injury of thorax, initial encounter: Secondary | ICD-10-CM | POA: Diagnosis present

## 2022-01-26 DIAGNOSIS — S2243XA Multiple fractures of ribs, bilateral, initial encounter for closed fracture: Secondary | ICD-10-CM | POA: Insufficient documentation

## 2022-01-26 DIAGNOSIS — R1011 Right upper quadrant pain: Secondary | ICD-10-CM | POA: Diagnosis not present

## 2022-01-26 DIAGNOSIS — S7011XA Contusion of right thigh, initial encounter: Secondary | ICD-10-CM | POA: Diagnosis not present

## 2022-01-26 LAB — CBC
HCT: 35.9 % — ABNORMAL LOW (ref 36.0–46.0)
Hemoglobin: 12.3 g/dL (ref 12.0–15.0)
MCH: 30.8 pg (ref 26.0–34.0)
MCHC: 34.3 g/dL (ref 30.0–36.0)
MCV: 89.8 fL (ref 80.0–100.0)
Platelets: 155 10*3/uL (ref 150–400)
RBC: 4 MIL/uL (ref 3.87–5.11)
RDW: 12.9 % (ref 11.5–15.5)
WBC: 7.2 10*3/uL (ref 4.0–10.5)
nRBC: 0 % (ref 0.0–0.2)

## 2022-01-26 LAB — COMPREHENSIVE METABOLIC PANEL
ALT: 15 U/L (ref 0–44)
AST: 18 U/L (ref 15–41)
Albumin: 3.9 g/dL (ref 3.5–5.0)
Alkaline Phosphatase: 45 U/L (ref 38–126)
Anion gap: 6 (ref 5–15)
BUN: <5 mg/dL — ABNORMAL LOW (ref 6–20)
CO2: 21 mmol/L — ABNORMAL LOW (ref 22–32)
Calcium: 8.7 mg/dL — ABNORMAL LOW (ref 8.9–10.3)
Chloride: 114 mmol/L — ABNORMAL HIGH (ref 98–111)
Creatinine, Ser: 0.82 mg/dL (ref 0.44–1.00)
GFR, Estimated: >60 mL/min (ref >60)
Glucose, Bld: 107 mg/dL — ABNORMAL HIGH (ref 70–99)
Potassium: 3.6 mmol/L (ref 3.5–5.1)
Sodium: 141 mmol/L (ref 135–145)
Total Bilirubin: 0.7 mg/dL (ref 0.3–1.2)
Total Protein: 6 g/dL — ABNORMAL LOW (ref 6.5–8.1)

## 2022-01-26 LAB — I-STAT CHEM 8, ED
BUN: <3 mg/dL — ABNORMAL LOW (ref 6–20)
Calcium, Ion: 0.96 mmol/L — ABNORMAL LOW (ref 1.15–1.40)
Chloride: 109 mmol/L (ref 98–111)
Creatinine, Ser: 0.7 mg/dL (ref 0.44–1.00)
Glucose, Bld: 100 mg/dL — ABNORMAL HIGH (ref 70–99)
HCT: 34 % — ABNORMAL LOW (ref 36.0–46.0)
Hemoglobin: 11.6 g/dL — ABNORMAL LOW (ref 12.0–15.0)
Potassium: 3.6 mmol/L (ref 3.5–5.1)
Sodium: 142 mmol/L (ref 135–145)
TCO2: 23 mmol/L (ref 22–32)

## 2022-01-26 LAB — PROTIME-INR
INR: 1 (ref 0.8–1.2)
Prothrombin Time: 13.2 seconds (ref 11.4–15.2)

## 2022-01-26 LAB — SAMPLE TO BLOOD BANK

## 2022-01-26 LAB — LACTIC ACID, PLASMA: Lactic Acid, Venous: 1 mmol/L (ref 0.5–1.9)

## 2022-01-26 LAB — ETHANOL: Alcohol, Ethyl (B): <10 mg/dL (ref <10)

## 2022-01-26 MED ORDER — FENTANYL CITRATE PF 50 MCG/ML IJ SOSY
50.0000 ug | PREFILLED_SYRINGE | Freq: Once | INTRAMUSCULAR | Status: AC
  Administered 2022-01-26: 50 ug via INTRAVENOUS
  Filled 2022-01-26: qty 1

## 2022-01-26 MED ORDER — DIAZEPAM 2 MG PO TABS
2.0000 mg | ORAL_TABLET | Freq: Once | ORAL | Status: AC
  Administered 2022-01-26: 2 mg via ORAL
  Filled 2022-01-26: qty 1

## 2022-01-26 MED ORDER — HYDROCODONE-ACETAMINOPHEN 5-325 MG PO TABS: 1.0000 | ORAL_TABLET | Freq: Two times a day (BID) | ORAL | 0 refills | Status: AC | PRN

## 2022-01-26 MED ORDER — IOHEXOL 300 MG/ML  SOLN
100.0000 mL | Freq: Once | INTRAMUSCULAR | Status: AC | PRN
  Administered 2022-01-26: 100 mL via INTRAVENOUS

## 2022-01-26 MED ORDER — KETOROLAC TROMETHAMINE 15 MG/ML IJ SOLN
15.0000 mg | Freq: Once | INTRAMUSCULAR | Status: AC
  Administered 2022-01-26: 15 mg via INTRAVENOUS
  Filled 2022-01-26: qty 1

## 2022-01-26 MED ORDER — IBUPROFEN 600 MG PO TABS: 600.0000 mg | ORAL_TABLET | Freq: Four times a day (QID) | ORAL | 0 refills | Status: DC | PRN

## 2022-01-26 MED ORDER — HYDROMORPHONE HCL 1 MG/ML IJ SOLN
1.0000 mg | Freq: Once | INTRAMUSCULAR | Status: AC
  Administered 2022-01-26: 1 mg via INTRAVENOUS
  Filled 2022-01-26: qty 1

## 2022-01-26 MED ORDER — ACETAMINOPHEN 500 MG PO TABS: 500.0000 mg | ORAL_TABLET | Freq: Four times a day (QID) | ORAL | 0 refills | Status: DC | PRN

## 2022-01-26 MED ORDER — TETANUS-DIPHTH-ACELL PERTUSSIS 5-2.5-18.5 LF-MCG/0.5 IM SUSY
0.5000 mL | PREFILLED_SYRINGE | Freq: Once | INTRAMUSCULAR | Status: AC
  Administered 2022-01-26: 0.5 mL via INTRAMUSCULAR
  Filled 2022-01-26: qty 0.5

## 2022-01-26 MED ORDER — SODIUM CHLORIDE 0.9% FLUSH: 3.0000 mL | INTRAVENOUS | Status: DC | PRN

## 2022-01-26 MED ORDER — SODIUM CHLORIDE 0.9% FLUSH
3.0000 mL | Freq: Two times a day (BID) | INTRAVENOUS | Status: DC
  Administered 2022-01-26: 3 mL via INTRAVENOUS

## 2022-01-26 MED ORDER — METHOCARBAMOL 500 MG PO TABS: 500.0000 mg | ORAL_TABLET | Freq: Two times a day (BID) | ORAL | 0 refills | Status: DC

## 2022-01-26 MED ORDER — FENTANYL CITRATE PF 50 MCG/ML IJ SOSY
100.0000 ug | PREFILLED_SYRINGE | Freq: Once | INTRAMUSCULAR | Status: AC
  Administered 2022-01-26: 100 ug via INTRAVENOUS
  Filled 2022-01-26: qty 2

## 2022-01-26 MED ORDER — SODIUM CHLORIDE 0.9 % IV SOLN: 250.0000 mL | INTRAVENOUS | Status: DC | PRN
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
## 2022-01-26 NOTE — ED Notes (Signed)
CT called to take pt to CT.  MD notified of pt's continued pain.
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
## 2022-03-25 ENCOUNTER — Other Ambulatory Visit: Payer: Self-pay | Admitting: Family Medicine

## 2022-03-25 DIAGNOSIS — Z1231 Encounter for screening mammogram for malignant neoplasm of breast: Secondary | ICD-10-CM

## 2022-04-16 ENCOUNTER — Ambulatory Visit
Admission: RE | Admit: 2022-04-16 | Discharge: 2022-04-16 | Disposition: A | Discharge status: Home / Self Care | Payer: Medicaid Other | Source: Ambulatory Visit | Attending: Family Medicine | Admitting: Family Medicine

## 2022-04-16 DIAGNOSIS — Z1231 Encounter for screening mammogram for malignant neoplasm of breast: Secondary | ICD-10-CM | POA: Insufficient documentation

## 2022-04-30 ENCOUNTER — Other Ambulatory Visit: Payer: Self-pay | Admitting: Family Medicine

## 2022-04-30 DIAGNOSIS — S2243XD Multiple fractures of ribs, bilateral, subsequent encounter for fracture with routine healing: Secondary | ICD-10-CM

## 2022-05-06 ENCOUNTER — Ambulatory Visit: Payer: Medicaid Other

## 2022-05-26 ENCOUNTER — Ambulatory Visit: Payer: Medicaid Other

## 2022-05-28 ENCOUNTER — Ambulatory Visit: Admit: 2022-05-28 | Disposition: A | Discharge status: Home / Self Care | Payer: Medicaid Other

## 2022-06-07 ENCOUNTER — Other Ambulatory Visit: Payer: Self-pay | Admitting: Family Medicine

## 2022-06-07 DIAGNOSIS — R1030 Lower abdominal pain, unspecified: Secondary | ICD-10-CM

## 2022-06-11 ENCOUNTER — Ambulatory Visit
Admission: RE | Admit: 2022-06-11 | Discharge: 2022-06-11 | Disposition: A | Discharge status: Home / Self Care | Payer: Medicaid Other | Source: Ambulatory Visit | Attending: Family Medicine | Admitting: Family Medicine

## 2022-06-11 DIAGNOSIS — R1030 Lower abdominal pain, unspecified: Secondary | ICD-10-CM | POA: Insufficient documentation

## 2022-06-11 LAB — POCT I-STAT CREATININE: Creatinine, Ser: 0.7 mg/dL (ref 0.44–1.00)

## 2022-06-11 MED ORDER — IOHEXOL 300 MG/ML  SOLN
100.0000 mL | Freq: Once | INTRAMUSCULAR | Status: AC | PRN
  Administered 2022-06-11: 100 mL via INTRAVENOUS

## 2022-08-19 ENCOUNTER — Ambulatory Visit (INDEPENDENT_AMBULATORY_CARE_PROVIDER_SITE_OTHER): Payer: Medicaid Other | Admitting: Gastroenterology

## 2022-08-19 ENCOUNTER — Encounter: Payer: Self-pay | Admitting: Gastroenterology

## 2022-08-19 ENCOUNTER — Other Ambulatory Visit: Payer: Self-pay

## 2022-08-19 VITALS — BP 134/85 | HR 61 | Temp 98.8°F | Ht 64.0 in | Wt 186.0 lb

## 2022-08-19 DIAGNOSIS — F419 Anxiety disorder, unspecified: Secondary | ICD-10-CM | POA: Insufficient documentation

## 2022-08-19 DIAGNOSIS — R109 Unspecified abdominal pain: Secondary | ICD-10-CM

## 2022-08-19 DIAGNOSIS — Z791 Long term (current) use of non-steroidal anti-inflammatories (NSAID): Secondary | ICD-10-CM | POA: Diagnosis not present

## 2022-08-19 DIAGNOSIS — F1291 Cannabis use, unspecified, in remission: Secondary | ICD-10-CM

## 2022-08-19 DIAGNOSIS — I10 Essential (primary) hypertension: Secondary | ICD-10-CM | POA: Insufficient documentation

## 2022-08-19 DIAGNOSIS — R197 Diarrhea, unspecified: Secondary | ICD-10-CM

## 2022-08-19 DIAGNOSIS — E785 Hyperlipidemia, unspecified: Secondary | ICD-10-CM | POA: Insufficient documentation

## 2022-08-19 MED ORDER — NA SULFATE-K SULFATE-MG SULF 17.5-3.13-1.6 GM/177ML PO SOLN: 354.0000 mL | Freq: Once | ORAL | 0 refills | Status: AC

## 2022-08-19 MED ORDER — OMEPRAZOLE 40 MG PO CPDR: 40.0000 mg | DELAYED_RELEASE_CAPSULE | Freq: Two times a day (BID) | ORAL | 0 refills | Status: DC

## 2022-08-19 NOTE — Progress Notes (Unsigned)
Wyline Mood MD, MRCP(U.K) [address]  [address]  [place], [place] [address]  Main: [phone]  Fax: [phone]   Gastroenterology Consultation  Referring Provider:     [facility], [facility] Primary Care Physician:  Center, [facility] Primary Gastroenterologist:  Dr. Wyline Mood  Reason for Consultation:     Lower abdominal pain        HPI:   Amber Campos is a 47 y.o. y/o female has been referred for lower abdominal pain.  She used to be seen by [facility] clinic gastroenterology back in 2020.  From last GI note she has a history of a cholecystectomy in 2020, internal hemorrhoids constipation and had been on Linzess 145 mcg a day.  In 2020 noted to have LA grade a esophagitis was being evaluated for dysphagia.  She had been on omeprazole 40 mg twice daily as per last GI note.  06/12/2022 CT abdomen pelvis with contrast showed no acute abnormalities 08/18/2022: Urinalysis shows moderate leukocyte esterase, hemoglobin 13.6 g.  CMP normal.  She was seen at [facility] ER yesterday for abdominal pain, dysuria, recently completed a course of Keflex for UTI was treated as a UTI treatment failure she also had a CT scan of the abdomen and pelvis with contrast yesterday which showed no acute findings.  In 03/31/2022 also had a ER visit for flank pain.  She says that she been having right-sided pain after she eats lasting for a few hours for a few months.  Previously had constipation now having diarrhea multiple times a day.  No rectal bleeding.  Smokes marijuana.  Abdominal pain is crampy in nature.  No other relieving factors.  Has been taking Motrin on a regular basis multiple times a day.  Past Medical History:  Diagnosis Date   Anxiety    Chronic constipation    Colon polyp 09/2014   Depression    Dyspareunia    Dysphagia    GERD (gastroesophageal reflux disease)    History of chickenpox    History of recurrent UTI (urinary tract infection)    HLD (hyperlipidemia)     HTN (hypertension)    Hypogammaglobulinemia (HCC)    Migraine    Perimenopausal atrophic vaginitis    Urinary frequency     Past Surgical History:  Procedure Laterality Date   ABDOMINAL HYSTERECTOMY  2015   Complete   ABDOMINAL HYSTERECTOMY     APPENDECTOMY     CESAREAN SECTION     COLONOSCOPY  09/2014   polyp removed   ENDOMETRIAL ABLATION     ESOPHAGOGASTRODUODENOSCOPY  09/2014   KNEE ARTHROSCOPY Bilateral 2011, 2015   OOPHORECTOMY     TONSILLECTOMY AND ADENOIDECTOMY      Prior to Admission medications   Medication Sig Start Date End Date Taking? Authorizing Provider  baclofen (LIORESAL) 10 MG tablet Take 10 mg by mouth 2 (two) times daily.    [provider]  cetirizine (ZYRTEC) 10 MG tablet Take 10 mg by mouth daily.    [provider]  clonazePAM (KLONOPIN) 1 MG tablet Take 1 mg by mouth 2 (two) times daily.  10/01/13   [provider]  dicyclomine (BENTYL) 20 MG tablet Take 20 mg by mouth 4 (four) times daily -  before meals and at bedtime.    [provider]  diphenoxylate-atropine (LOMOTIL) 2.5-0.025 MG tablet Take 1 tablet by mouth.    [provider]  gabapentin (NEURONTIN) 300 MG capsule Take 300 mg by mouth 3 (three)  times daily.    [provider]  ibuprofen (ADVIL) 600 MG tablet Take 1 tablet (600 mg total) by mouth every 6 (six) hours as needed. 01/26/22   Derwood Kaplan, MD  meloxicam (MOBIC) 7.5 MG tablet Take 7.5 mg by mouth daily. 06/06/22   [provider]  methocarbamol (ROBAXIN) 500 MG tablet Take 1 tablet (500 mg total) by mouth 2 (two) times daily. 01/26/22   Derwood Kaplan, MD  metoprolol tartrate (LOPRESSOR) 25 MG tablet Take 25 mg by mouth daily.    [provider]  Multiple Vitamin (MULTIVITAMIN) capsule Take 1 capsule by mouth daily.    [provider]  omeprazole (PRILOSEC) 20 MG capsule Take 20 mg by mouth daily.    [provider]  oxyCODONE-acetaminophen  (PERCOCET/ROXICET) 5-325 MG tablet Take 1 tablet by mouth every 6 (six) hours as needed.    [provider]  phenazopyridine (PYRIDIUM) 100 MG tablet Take 100 mg by mouth 3 (three) times daily with meals.    [provider]  promethazine (PHENERGAN) 25 MG tablet Take 25 mg by mouth every 6 (six) hours as needed.    [provider]  QUEtiapine (SEROQUEL) 25 MG tablet Take 25 mg by mouth 2 (two) times daily.    [provider]  Rosuvastatin Calcium 10 MG CPSP  10/11/18   [provider]  sertraline (ZOLOFT) 100 MG tablet Take 200 mg by mouth at bedtime. 08/22/15   [provider]  Sodium Fluoride 1.1 % PSTE Take by mouth daily.    [provider]  sulfamethoxazole-trimethoprim (BACTRIM DS) 800-160 MG tablet Take 1 tablet by mouth 2 (two) times daily. 02/28/21   Triplett, Kasandra Knudsen, FNP  linaclotide (LINZESS) 290 MCG CAPS capsule Take by mouth. 02/11/19 12/29/20  [provider]    Family History  Problem Relation Age of Onset   Hypertension Mother    Hyperlipidemia Mother    Diabetes Mother    Nephrolithiasis Mother        Father   Stroke Mother    Heart disease Mother    Colon polyps Mother    Hypertension Father    Hyperlipidemia Father    Transient ischemic attack Father    Prostate cancer Paternal Grandfather    Deep vein thrombosis Other    Breast cancer Maternal Aunt      Social History   Tobacco Use   Smoking status: Former    Packs/day: 0.25    Years: 20.00    Total pack years: 5.00    Types: Cigarettes   Smokeless tobacco: Never  Vaping Use   Vaping Use: Every day  Substance Use Topics   Alcohol use: Yes    Alcohol/week: 0.0 standard drinks of alcohol    Comment: occasional wine an dliquor   Drug use: No    Allergies as of 08/19/2022 - Review Complete 08/19/2022  Allergen Reaction Noted   Amoxicillin Hives, Itching, and Anaphylaxis 11/06/2016   Other Other (See Comments) 04/02/2015    Oxycodone-acetaminophen  05/15/2014    Review of Systems:    All systems reviewed and negative except where noted in HPI.   Physical Exam:  BP 134/85   Pulse 61   Temp 98.8 F (37.1 C) (Oral)   Ht 5\' 4"  (1.626 m)   Wt 186 lb (84.4 kg)   BMI 31.93 kg/m  No LMP recorded. Patient has had a hysterectomy. Psych:  Alert and cooperative. Normal mood and affect. General:   Alert,  Well-developed,  well-nourished, pleasant and cooperative in NAD Head:  Normocephalic and atraumatic. Eyes:  Sclera clear, no icterus.   Conjunctiva pink. Ears:  Normal auditory acuity. Neck:  Supple; no masses or thyromegaly. Lungs:  Respirations even and unlabored.  Clear throughout to auscultation.   No wheezes, crackles, or rhonchi. No acute distress. Heart:  Regular rate and rhythm; no murmurs, clicks, rubs, or gallops. Abdomen:  Normal bowel sounds.  No bruits.  Soft, non-tender and non-distended without masses, hepatosplenomegaly or hernias noted.  No guarding or rebound tenderness.    Neurologic:  Alert and oriented x3;  grossly normal neurologically. Psych:  Alert and cooperative. Normal mood and affect.  Imaging Studies: No results found.  Assessment and Plan:   Amber Campos is a 47 y.o. y/o female has been referred for right sided abdominal pain.  History suggestive of some elements of biliary colic she absolutely certain she has her gallbladder in place, history of NSAID use marijuana use.  Prior history of constipation presently has diarrhea.  Differentials for abdominal pain include biliary colic versus NSAID related inflammation versus effects of marijuana use.  Plan 1.  HIDA scan 2.  H. pylori breath test 3.  Increase dose of omeprazole from 40 mg once a day to twice a day due to prior history of esophagitis to treat any ulcers and inflammation 4.  Stop NSAID use.  Stop marijuana use. 5.  EGD and colonoscopy to evaluate further 6.  For diarrhea will obtain stool studies and celiac  serology.   I have discussed alternative options, risks & benefits,  which include, but are not limited to, bleeding, infection, perforation,respiratory complication & drug reaction.  The patient agrees with this plan & written consent will be obtained.     Follow up in 4 weeks  Dr Jonathon Bellows MD,MRCP(U.K)

## 2022-08-19 NOTE — Patient Instructions (Addendum)
Please arrive at the [facility] on 08/27/2022 at 7:30 AM. Please hold any pain medication 6 hours prior to your HIDA Scan. If this date and time does not work for you, please call [phone] to reschedule. You will need a driver.   Please do not eat or drink 6 hours prior to your HIDA Scan.

## 2022-08-21 ENCOUNTER — Ambulatory Visit: Payer: Medicaid Other | Admitting: Certified Registered"

## 2022-08-21 ENCOUNTER — Ambulatory Visit
Admission: RE | Admit: 2022-08-21 | Discharge: 2022-08-21 | Disposition: A | Discharge status: Home / Self Care | Payer: Medicaid Other | Attending: Gastroenterology | Admitting: Gastroenterology

## 2022-08-21 ENCOUNTER — Encounter
Admission: RE | Disposition: A | Discharge status: Home / Self Care | Payer: Self-pay | Source: Home / Self Care | Attending: Gastroenterology

## 2022-08-21 DIAGNOSIS — I10 Essential (primary) hypertension: Secondary | ICD-10-CM | POA: Insufficient documentation

## 2022-08-21 DIAGNOSIS — Z87891 Personal history of nicotine dependence: Secondary | ICD-10-CM | POA: Insufficient documentation

## 2022-08-21 DIAGNOSIS — G43909 Migraine, unspecified, not intractable, without status migrainosus: Secondary | ICD-10-CM | POA: Diagnosis not present

## 2022-08-21 DIAGNOSIS — K529 Noninfective gastroenteritis and colitis, unspecified: Secondary | ICD-10-CM | POA: Diagnosis not present

## 2022-08-21 DIAGNOSIS — F32A Depression, unspecified: Secondary | ICD-10-CM | POA: Insufficient documentation

## 2022-08-21 DIAGNOSIS — R109 Unspecified abdominal pain: Secondary | ICD-10-CM | POA: Diagnosis present

## 2022-08-21 DIAGNOSIS — R197 Diarrhea, unspecified: Secondary | ICD-10-CM

## 2022-08-21 DIAGNOSIS — K219 Gastro-esophageal reflux disease without esophagitis: Secondary | ICD-10-CM | POA: Insufficient documentation

## 2022-08-21 DIAGNOSIS — E785 Hyperlipidemia, unspecified: Secondary | ICD-10-CM | POA: Insufficient documentation

## 2022-08-21 DIAGNOSIS — F419 Anxiety disorder, unspecified: Secondary | ICD-10-CM | POA: Insufficient documentation

## 2022-08-21 DIAGNOSIS — Z9049 Acquired absence of other specified parts of digestive tract: Secondary | ICD-10-CM | POA: Insufficient documentation

## 2022-08-21 DIAGNOSIS — Z791 Long term (current) use of non-steroidal anti-inflammatories (NSAID): Secondary | ICD-10-CM

## 2022-08-21 DIAGNOSIS — F1291 Cannabis use, unspecified, in remission: Secondary | ICD-10-CM

## 2022-08-21 DIAGNOSIS — Z79899 Other long term (current) drug therapy: Secondary | ICD-10-CM | POA: Diagnosis not present

## 2022-08-21 HISTORY — PX: ESOPHAGOGASTRODUODENOSCOPY: SHX5428

## 2022-08-21 HISTORY — PX: COLONOSCOPY WITH PROPOFOL: SHX5780

## 2022-08-21 LAB — CELIAC DISEASE AB SCREEN W/RFX
Antigliadin Abs, IgA: 2 units (ref 0–19)
IgA/Immunoglobulin A, Serum: 47 mg/dL — ABNORMAL LOW (ref 87–352)
Transglutaminase IgA: <2 U/mL (ref 0–3)

## 2022-08-21 LAB — H. PYLORI BREATH TEST: H pylori Breath Test: NEGATIVE

## 2022-08-21 SURGERY — COLONOSCOPY WITH PROPOFOL
Anesthesia: General

## 2022-08-21 MED ORDER — PROPOFOL 10 MG/ML IV BOLUS
INTRAVENOUS | Status: DC | PRN
  Administered 2022-08-21 (×2): 50 mg via INTRAVENOUS

## 2022-08-21 MED ORDER — PROPOFOL 10 MG/ML IV BOLUS
INTRAVENOUS | Status: AC
  Filled 2022-08-21: qty 20

## 2022-08-21 MED ORDER — ONDANSETRON HCL 4 MG/2ML IJ SOLN
INTRAMUSCULAR | Status: DC | PRN
  Administered 2022-08-21: 4 mg via INTRAVENOUS

## 2022-08-21 MED ORDER — LIDOCAINE HCL (CARDIAC) PF 100 MG/5ML IV SOSY
PREFILLED_SYRINGE | INTRAVENOUS | Status: DC | PRN
  Administered 2022-08-21 (×2): 50 mg via INTRAVENOUS

## 2022-08-21 MED ORDER — PROPOFOL 500 MG/50ML IV EMUL
INTRAVENOUS | Status: DC | PRN
  Administered 2022-08-21: 200 ug/kg/min via INTRAVENOUS

## 2022-08-21 MED ORDER — SODIUM CHLORIDE 0.9 % IV SOLN
INTRAVENOUS | Status: DC
  Administered 2022-08-21: 1000 mL via INTRAVENOUS

## 2022-08-21 NOTE — Op Note (Signed)
[facility] Gastroenterology Patient Name: Amber Campos Procedure Date: 08/21/2022 10:56 AM MRN: [id number] Account #: [id number] Date of Birth: 10/04/1975 Admit Type: Outpatient Age: 47 Room: [facility] ENDO ROOM 3 Gender: Female Note Status: Finalized Instrument Name: Altamese Cabal Endoscope [id number] Procedure:             Upper GI endoscopy Indications:           Abdominal pain Providers:             Jonathon Bellows MD, MD Referring MD:          Hattie Perch. Sherril Cong (Referring MD) Medicines:             Monitored Anesthesia Care Complications:         No immediate complications. Procedure:             Pre-Anesthesia Assessment:                        - Prior to the procedure, a History and Physical was                         performed, and patient medications, allergies and                         sensitivities were reviewed. The patient's tolerance                         of previous anesthesia was reviewed.                        - The risks and benefits of the procedure and the                         sedation options and risks were discussed with the                         patient. All questions were answered and informed                         consent was obtained.                        - ASA Grade Assessment: II - A patient with mild                         systemic disease.                        After obtaining informed consent, the endoscope was                         passed under direct vision. Throughout the procedure,                         the patient's blood pressure, pulse, and oxygen                         saturations were monitored continuously. The Endoscope                         was introduced  through the mouth, and advanced to the                         third part of duodenum. The upper GI endoscopy was                         accomplished with ease. The patient tolerated the                         procedure well. Findings:      The esophagus was  normal.      The entire examined stomach was normal. Biopsies were taken with a cold       forceps for histology.      The examined duodenum was normal. Biopsies for histology were taken with       a cold forceps for evaluation of celiac disease.      The cardia and gastric fundus were normal on retroflexion. Impression:            - Normal esophagus.                        - Normal stomach. Biopsied.                        - Normal examined duodenum. Biopsied. Recommendation:        - Await pathology results.                        - Perform a colonoscopy today. Procedure Code(s):     --- Professional ---                        [phone], Esophagogastroduodenoscopy, flexible,                         transoral; with biopsy, single or multiple Diagnosis Code(s):     --- Professional ---                        R10.9, Unspecified abdominal pain CPT copyright 2022 American Medical Association. All rights reserved. The codes documented in this report are preliminary and upon coder review may  be revised to meet current compliance requirements. Jonathon Bellows, MD Jonathon Bellows MD, MD 08/21/2022 11:23:05 AM This report has been signed electronically. Number of Addenda: 0 Note Initiated On: 08/21/2022 10:56 AM Estimated Blood Loss:  Estimated blood loss: none.      [facility]

## 2022-08-21 NOTE — Anesthesia Preprocedure Evaluation (Signed)
Anesthesia Evaluation  Patient identified by MRN, date of birth, ID band Patient awake    Reviewed: Allergy & Precautions, NPO status , Patient's Chart, lab work & pertinent test results  History of Anesthesia Complications Negative for: history of anesthetic complications  Airway Mallampati: II  TM Distance: >3 FB Neck ROM: full    Dental no notable dental hx.    Pulmonary neg pulmonary ROS, former smoker   Pulmonary exam normal        Cardiovascular hypertension, On Medications negative cardio ROS Normal cardiovascular exam     Neuro/Psych  Headaches PSYCHIATRIC DISORDERS Anxiety Depression       GI/Hepatic Neg liver ROS,GERD  ,,Dysphagia    Endo/Other  negative endocrine ROS    Renal/GU negative Renal ROS  negative genitourinary   Musculoskeletal   Abdominal   Peds  Hematology negative hematology ROS (+)   Anesthesia Other Findings Past Medical History: No date: Anxiety No date: Chronic constipation 09/2014: Colon polyp No date: Depression No date: Dyspareunia No date: Dysphagia No date: GERD (gastroesophageal reflux disease) No date: History of chickenpox No date: History of recurrent UTI (urinary tract infection) No date: HLD (hyperlipidemia) No date: HTN (hypertension) No date: Hypogammaglobulinemia (HCC) No date: Migraine No date: Perimenopausal atrophic vaginitis No date: Urinary frequency  Past Surgical History: 2015: ABDOMINAL HYSTERECTOMY     Comment:  Complete No date: ABDOMINAL HYSTERECTOMY No date: APPENDECTOMY No date: CESAREAN SECTION 09/2014: COLONOSCOPY     Comment:  polyp removed No date: ENDOMETRIAL ABLATION 09/2014: ESOPHAGOGASTRODUODENOSCOPY 2011, 2015: KNEE ARTHROSCOPY; Bilateral No date: OOPHORECTOMY No date: TONSILLECTOMY AND ADENOIDECTOMY     Reproductive/Obstetrics negative OB ROS                             Anesthesia Physical Anesthesia  Plan  ASA: 2  Anesthesia Plan: General   Post-op Pain Management:    Induction: Intravenous  PONV Risk Score and Plan: Propofol infusion and TIVA  Airway Management Planned: Natural Airway and Nasal Cannula  Additional Equipment:   Intra-op Plan:   Post-operative Plan:   Informed Consent: I have reviewed the patients History and Physical, chart, labs and discussed the procedure including the risks, benefits and alternatives for the proposed anesthesia with the patient or authorized representative who has indicated his/her understanding and acceptance.     Dental Advisory Given  Plan Discussed with: Anesthesiologist, CRNA and Surgeon  Anesthesia Plan Comments: (Patient consented for risks of anesthesia including but not limited to:  - adverse reactions to medications - risk of airway placement if required - damage to eyes, teeth, lips or other oral mucosa - nerve damage due to positioning  - sore throat or hoarseness - Damage to heart, brain, nerves, lungs, other parts of body or loss of life  Patient voiced understanding.)       Anesthesia Quick Evaluation

## 2022-08-21 NOTE — Op Note (Signed)
[facility] Gastroenterology Patient Name: Amber Campos Procedure Date: 08/21/2022 10:55 AM MRN: [id number] Account #: [id number] Date of Birth: 1976-01-10 Admit Type: Outpatient Age: 47 Room: [facility] ENDO ROOM 3 Gender: Female Note Status: Finalized Instrument Name: Jasper Riling [id number] Procedure:             Colonoscopy Indications:           Chronic diarrhea Providers:             Jonathon Bellows MD, MD Referring MD:          Hattie Perch. Sherril Cong (Referring MD) Medicines:             Monitored Anesthesia Care Complications:         No immediate complications. Procedure:             Pre-Anesthesia Assessment:                        - Prior to the procedure, a History and Physical was                         performed, and patient medications, allergies and                         sensitivities were reviewed. The patient's tolerance                         of previous anesthesia was reviewed.                        - The risks and benefits of the procedure and the                         sedation options and risks were discussed with the                         patient. All questions were answered and informed                         consent was obtained.                        - ASA Grade Assessment: II - A patient with mild                         systemic disease.                        After obtaining informed consent, the colonoscope was                         passed under direct vision. Throughout the procedure,                         the patient's blood pressure, pulse, and oxygen                         saturations were monitored continuously. The                         Colonoscope was introduced through the anus  and                         advanced to the the terminal ileum. The colonoscopy                         was performed with ease. The patient tolerated the                         procedure well. The quality of the bowel preparation                          was excellent. The terminal ileum, ileocecal valve,                         appendiceal orifice, and rectum were photographed. Findings:      The terminal ileum appeared normal. Biopsies were taken with a cold       forceps for histology.      The colon (entire examined portion) appeared normal. Biopsies were taken       with a cold forceps for histology.      No additional abnormalities were found on retroflexion. Impression:            - The examined portion of the ileum was normal.                         Biopsied.                        - The entire examined colon is normal. Biopsied. Recommendation:        - Discharge patient to home (with escort).                        - Resume previous diet.                        - Continue present medications.                        - Await pathology results.                        - Repeat colonoscopy in 10 years for screening                         purposes.                        - Return to GI office as previously scheduled. Procedure Code(s):     --- Professional ---                        [phone], Colonoscopy, flexible; with biopsy, single or                         multiple Diagnosis Code(s):     --- Professional ---                        K52.9, Noninfective gastroenteritis and colitis,  unspecified CPT copyright 2022 American Medical Association. All rights reserved. The codes documented in this report are preliminary and upon coder review may  be revised to meet current compliance requirements. Jonathon Bellows, MD Jonathon Bellows MD, MD 08/21/2022 11:36:48 AM This report has been signed electronically. Number of Addenda: 0 Note Initiated On: 08/21/2022 10:55 AM Scope Withdrawal Time: 0 hours 6 minutes 58 seconds  Total Procedure Duration: 0 hours 9 minutes 56 seconds  Estimated Blood Loss:  Estimated blood loss: none.      [facility]

## 2022-08-21 NOTE — Anesthesia Postprocedure Evaluation (Signed)
Anesthesia Post Note  Patient: Amber Campos  Procedure(s) Performed: COLONOSCOPY WITH PROPOFOL ESOPHAGOGASTRODUODENOSCOPY (EGD)  Patient location during evaluation: Endoscopy Anesthesia Type: General Level of consciousness: awake and alert Pain management: pain level controlled Vital Signs Assessment: post-procedure vital signs reviewed and stable Respiratory status: spontaneous breathing, nonlabored ventilation, respiratory function stable and patient connected to nasal cannula oxygen Cardiovascular status: blood pressure returned to baseline and stable Postop Assessment: no apparent nausea or vomiting Anesthetic complications: no  No notable events documented.   Last Vitals:  Vitals:   08/21/22 1140 08/21/22 1200  BP: 132/88 131/76  Pulse: 88   Resp: 20   Temp: 36.8 C   SpO2: 100%     Last Pain:  Vitals:   08/21/22 1200  TempSrc:   PainSc: 0-No pain                 Ilene Qua

## 2022-08-21 NOTE — H&P (Signed)
Amber Bellows, MD [address] [place], [place], [place], [id number], [place], [place], [place], [address] Phone: [phone]  Fax: [phone]  Primary Care Physician:  Frazier Richards, MD   Pre-Procedure History & Physical: HPI:  ZOIE SARIN is a 47 y.o. female is here for an endoscopy and colonoscopy    Past Medical History:  Diagnosis Date   Anxiety    Chronic constipation    Colon polyp 09/2014   Depression    Dyspareunia    Dysphagia    GERD (gastroesophageal reflux disease)    History of chickenpox    History of recurrent UTI (urinary tract infection)    HLD (hyperlipidemia)    HTN (hypertension)    Hypogammaglobulinemia (HCC)    Migraine    Perimenopausal atrophic vaginitis    Urinary frequency     Past Surgical History:  Procedure Laterality Date   ABDOMINAL HYSTERECTOMY  2015   Complete   ABDOMINAL HYSTERECTOMY     APPENDECTOMY     CESAREAN SECTION     COLONOSCOPY  09/2014   polyp removed   ENDOMETRIAL ABLATION     ESOPHAGOGASTRODUODENOSCOPY  09/2014   KNEE ARTHROSCOPY Bilateral 2011, 2015   OOPHORECTOMY     TONSILLECTOMY AND ADENOIDECTOMY      Prior to Admission medications   Medication Sig Start Date End Date Taking? Authorizing Provider  baclofen (LIORESAL) 10 MG tablet Take 10 mg by mouth 2 (two) times daily.    [provider]  butalbital-acetaminophen-caffeine (FIORICET) 50-325-40 MG tablet Take 1 tablet by mouth every 6 (six) hours as needed.    [provider]  cefdinir (OMNICEF) 300 MG capsule Take 600 mg by mouth daily. 08/18/22 08/28/22  [provider]  cetirizine (ZYRTEC) 10 MG tablet Take 10 mg by mouth daily.    [provider]  clonazePAM (KLONOPIN) 1 MG tablet Take 1 mg by mouth 2 (two) times daily.  10/01/13   [provider]  dicyclomine (BENTYL) 20 MG tablet Take 20 mg by mouth 4 (four) times daily -  before meals and at bedtime.    [provider]   diphenoxylate-atropine (LOMOTIL) 2.5-0.025 MG tablet Take 1 tablet by mouth.    [provider]  gabapentin (NEURONTIN) 300 MG capsule Take 300 mg by mouth 3 (three) times daily.    [provider]  ibuprofen (ADVIL) 600 MG tablet Take 1 tablet (600 mg total) by mouth every 6 (six) hours as needed. 01/26/22   Varney Biles, MD  meloxicam (MOBIC) 7.5 MG tablet Take 7.5 mg by mouth daily. 06/06/22   [provider]  methocarbamol (ROBAXIN) 500 MG tablet Take 1 tablet (500 mg total) by mouth 2 (two) times daily. 01/26/22   Varney Biles, MD  metoprolol tartrate (LOPRESSOR) 25 MG tablet Take 25 mg by mouth daily.    [provider]  Multiple Vitamin (MULTIVITAMIN) capsule Take 1 capsule by mouth daily.    [provider]  omeprazole (PRILOSEC) 40 MG capsule Take 1 capsule (40 mg total) by mouth in the morning and at bedtime. 08/19/22   Amber Bellows, MD  oxyCODONE-acetaminophen (PERCOCET/ROXICET) 5-325 MG tablet Take 1 tablet by mouth every 6 (six) hours as needed.    [provider]  phenazopyridine (PYRIDIUM) 100 MG tablet Take 100 mg by mouth 3 (three) times daily with meals.    [provider]  promethazine (PHENERGAN) 25 MG tablet Take 25 mg by mouth every 6 (six)  hours as needed.    [provider]  QUEtiapine (SEROQUEL) 25 MG tablet Take 25 mg by mouth 2 (two) times daily.    [provider]  Rosuvastatin Calcium 10 MG CPSP  10/11/18   [provider]  sertraline (ZOLOFT) 100 MG tablet Take 200 mg by mouth at bedtime. 08/22/15   [provider]  Sodium Fluoride 1.1 % PSTE Take by mouth daily.    [provider]  sulfamethoxazole-trimethoprim (BACTRIM DS) 800-160 MG tablet Take 1 tablet by mouth 2 (two) times daily. 02/28/21   Triplett, Kasandra Knudsen, FNP  linaclotide (LINZESS) 290 MCG CAPS capsule Take by mouth. 02/11/19 12/29/20  [provider]    Allergies as of 08/20/2022 - Review Complete  08/19/2022  Allergen Reaction Noted   Amoxicillin Hives, Itching, and Anaphylaxis 11/06/2016   Other Other (See Comments) 04/02/2015   Oxycodone-acetaminophen  05/15/2014    Family History  Problem Relation Age of Onset   Hypertension Mother    Hyperlipidemia Mother    Diabetes Mother    Nephrolithiasis Mother        Father   Stroke Mother    Heart disease Mother    Colon polyps Mother    Hypertension Father    Hyperlipidemia Father    Transient ischemic attack Father    Prostate cancer Paternal Grandfather    Deep vein thrombosis Other    Breast cancer Maternal Aunt     Social History   Socioeconomic History   Marital status: Married    Spouse name: Not on file   Number of children: Not on file   Years of education: Not on file   Highest education level: Not on file  Occupational History   Not on file  Tobacco Use   Smoking status: Former    Packs/day: 0.25    Years: 20.00    Total pack years: 5.00    Types: Cigarettes   Smokeless tobacco: Never  Vaping Use   Vaping Use: Every day  Substance and Sexual Activity   Alcohol use: Yes    Alcohol/week: 0.0 standard drinks of alcohol    Comment: occasional wine an dliquor   Drug use: No   Sexual activity: Yes  Other Topics Concern   Not on file  Social History Narrative   Married to Bonnie Brae, has 3 children   Right handed   Some college   Drinks caffeine free   Social Determinants of Health   Financial Resource Strain: Not on file  Food Insecurity: Not on file  Transportation Needs: Not on file  Physical Activity: Not on file  Stress: Not on file  Social Connections: Not on file  Intimate Partner Violence: Not on file    Review of Systems: See HPI, otherwise negative ROS  Physical Exam: There were no vitals taken for this visit. General:   Alert,  pleasant and cooperative in NAD Head:  Normocephalic and atraumatic. Neck:  Supple; no masses or thyromegaly. Lungs:  Clear throughout to auscultation,  normal respiratory effort.    Heart:  +S1, +S2, Regular rate and rhythm, No edema. Abdomen:  Soft, nontender and nondistended. Normal bowel sounds, without guarding, and without rebound.   Neurologic:  Alert and  oriented x4;  grossly normal neurologically.  Impression/Plan: Amber Campos is here for an endoscopy and colonoscopy  to be performed for  evaluation of abdominal pain and diarrhea    Risks, benefits, limitations, and alternatives regarding endoscopy have been reviewed with the patient.  Questions have been answered.  All parties agreeable.   Amber Bellows, MD  08/21/2022, 10:55 AM

## 2022-08-21 NOTE — Transfer of Care (Signed)
Immediate Anesthesia Transfer of Care Note  Patient: Amber Campos  Procedure(s) Performed: COLONOSCOPY WITH PROPOFOL ESOPHAGOGASTRODUODENOSCOPY (EGD)  Patient Location: PACU  Anesthesia Type:General  Level of Consciousness: awake, alert , oriented, and patient cooperative  Airway & Oxygen Therapy: Patient Spontanous Breathing  Post-op Assessment: Report given to RN and Post -op Vital signs reviewed and stable  Post vital signs: Reviewed and stable  Last Vitals:  Vitals Value Taken Time  BP 132/88 08/21/22 1140  Temp 36.8 C 08/21/22 1140  Pulse 87 08/21/22 1142  Resp 17 08/21/22 1142  SpO2 99 % 08/21/22 1142  Vitals shown include unvalidated device data.  Last Pain:  Vitals:   08/21/22 1140  TempSrc: Temporal  PainSc: 0-No pain         Complications: No notable events documented.

## 2022-08-22 ENCOUNTER — Encounter: Payer: Self-pay | Admitting: Gastroenterology

## 2022-08-22 ENCOUNTER — Other Ambulatory Visit: Payer: Self-pay

## 2022-08-22 DIAGNOSIS — R109 Unspecified abdominal pain: Secondary | ICD-10-CM

## 2022-08-22 LAB — SURGICAL PATHOLOGY

## 2022-08-26 ENCOUNTER — Encounter: Payer: Self-pay | Admitting: Surgery

## 2022-08-26 ENCOUNTER — Ambulatory Visit (INDEPENDENT_AMBULATORY_CARE_PROVIDER_SITE_OTHER): Payer: Medicaid Other | Admitting: Surgery

## 2022-08-26 VITALS — BP 135/80 | HR 61 | Temp 98.0°F | Ht 64.0 in | Wt 186.8 lb

## 2022-08-26 DIAGNOSIS — R1011 Right upper quadrant pain: Secondary | ICD-10-CM

## 2022-08-26 DIAGNOSIS — R1031 Right lower quadrant pain: Secondary | ICD-10-CM

## 2022-08-26 NOTE — Patient Instructions (Addendum)
Your Ultrasound is scheduled for 10 am (arrive by 9:45 am) at [facility]. Nothing to eat or drink after Midnight.   If you have any concerns or questions, please feel free to call our office. See follow up appointment below.   Gallbladder Eating Plan High blood cholesterol, obesity, a sedentary lifestyle, an unhealthy diet, and diabetes are risk factors for developing gallstones. If you have a gallbladder condition, you may have trouble digesting fats and tolerating high fat intake. Eating a low-fat diet can help reduce your symptoms and may be helpful before and after having surgery to remove your gallbladder (cholecystectomy). Your health care provider may recommend that you work with a dietitian to help you reduce the amount of fat in your diet. What are tips for following this plan? General guidelines Limit your fat intake to less than 30% of your total daily calories. If you eat around 1,800 calories each day, this means eating less than 60 grams (g) of fat per day. Fat is an important part of a healthy diet. Eating a low-fat diet can make it hard to maintain a healthy body weight. Ask your dietitian how much fat, calories, and other nutrients you need each day. Eat small, frequent meals throughout the day instead of three large meals. Drink at least 8-10 cups (1.9-2.4 L) of fluid a day. Drink enough fluid to keep your urine pale yellow. If you drink alcohol: Limit how much you have to: 0-1 drink a day for women who are not pregnant. 0-2 drinks a day for men. Know how much alcohol is in a drink. In the [place] one drink equals one 12 oz bottle of beer (355 mL), one 5 oz glass of wine (148 mL), or one 1 oz glass of hard liquor (44 mL). Reading food labels  Check nutrition facts on food labels for the amount of fat per serving. Choose foods with less than 3 grams of fat per serving. Shopping Choose nonfat and low-fat healthy foods. Look for the words "nonfat," "low-fat," or  "fat-free." Avoid buying processed or prepackaged foods. Cooking Cook using low-fat methods, such as baking, broiling, grilling, or boiling. Cook with small amounts of healthy fats, such as olive oil, grapeseed oil, canola oil, avocado oil, or sunflower oil. What foods are recommended?  All fresh, frozen, or canned fruits and vegetables. Whole grains. Low-fat or nonfat (skim) milk and yogurt. Lean meat, skinless poultry, fish, eggs, and beans. Low-fat protein supplement powders or drinks. Spices and herbs. The items listed above may not be a complete list of foods and beverages you can eat and drink. Contact a dietitian for more information. What foods are not recommended? High-fat foods. These include baked goods, fast food, fatty cuts of meat, ice cream, french toast, sweet rolls, pizza, cheese bread, foods covered with butter, creamy sauces, or cheese. Fried foods. These include french fries, tempura, battered fish, breaded chicken, fried breads, and sweets. Foods that cause bloating and gas. The items listed above may not be a complete list of foods that you should avoid. Contact a dietitian for more information. Summary A low-fat diet can be helpful if you have a gallbladder condition, or before and after gallbladder surgery. Limit your fat intake to less than 30% of your total daily calories. This is about 60 g of fat if you eat 1,800 calories each day. Eat small, frequent meals throughout the day instead of three large meals. This information is not intended to replace advice given to you by your  health care provider. Make sure you discuss any questions you have with your health care provider. Document Revised: 07/13/2021 Document Reviewed: 07/13/2021 Elsevier Patient Education  [place].

## 2022-08-27 ENCOUNTER — Encounter
Admission: RE | Admit: 2022-08-27 | Discharge: 2022-08-27 | Disposition: A | Discharge status: Home / Self Care | Payer: Medicaid Other | Source: Ambulatory Visit | Attending: Gastroenterology | Admitting: Gastroenterology

## 2022-08-27 DIAGNOSIS — R109 Unspecified abdominal pain: Secondary | ICD-10-CM | POA: Diagnosis present

## 2022-08-27 DIAGNOSIS — Z791 Long term (current) use of non-steroidal anti-inflammatories (NSAID): Secondary | ICD-10-CM | POA: Diagnosis present

## 2022-08-27 DIAGNOSIS — R197 Diarrhea, unspecified: Secondary | ICD-10-CM | POA: Diagnosis present

## 2022-08-27 DIAGNOSIS — F1291 Cannabis use, unspecified, in remission: Secondary | ICD-10-CM

## 2022-08-27 MED ORDER — TECHNETIUM TC 99M MEBROFENIN IV KIT
5.1800 | PACK | Freq: Once | INTRAVENOUS | Status: AC | PRN
  Administered 2022-08-27: 5.18 via INTRAVENOUS

## 2022-08-29 NOTE — Progress Notes (Signed)
Patient ID: Amber Campos, female   DOB: Aug 01, 1976, 47 y.o.   MRN: [id number]  HPI Amber Campos is a 47 y.o. female seen at the request of Dr. Vicente Males. She reports chronic abdominal pain for the last year or so.  She also reports some constipation and she has been on Linzess.  She has been recently seen by Dr. Vicente Males.  She also takes PPI.  She reports that the pain is intermittent located in the right upper quadrant and has history triggered by meals.  No fevers no chills.  She did have a prior CT that I have personally reviewed showing no evidence of acute intra-abdominal pathology.  She also had an ultrasound that I have also personally reviewed showing no evidence of gallstones.  She is able to perform more than 4 METS of activity without any shortness of breath or chest pain.   Surgical history include appendectomy, C-section oophorectomy and total abdominal hysterectomy.  HPI  Past Medical History:  Diagnosis Date   Anxiety    Chronic constipation    Colon polyp 09/2014   Depression    Dyspareunia    Dysphagia    GERD (gastroesophageal reflux disease)    History of chickenpox    History of recurrent UTI (urinary tract infection)    HLD (hyperlipidemia)    HTN (hypertension)    Hypogammaglobulinemia (HCC)    Migraine    Perimenopausal atrophic vaginitis    Urinary frequency     Past Surgical History:  Procedure Laterality Date   ABDOMINAL HYSTERECTOMY  2015   Complete   ABDOMINAL HYSTERECTOMY     APPENDECTOMY     CESAREAN SECTION     COLONOSCOPY  09/2014   polyp removed   COLONOSCOPY WITH PROPOFOL N/A 08/21/2022   Procedure: COLONOSCOPY WITH PROPOFOL;  Surgeon: Jonathon Bellows, MD;  Location: [facility] ENDOSCOPY;  Service: Gastroenterology;  Laterality: N/A;   ENDOMETRIAL ABLATION     ESOPHAGOGASTRODUODENOSCOPY  09/2014   ESOPHAGOGASTRODUODENOSCOPY N/A 08/21/2022   Procedure: ESOPHAGOGASTRODUODENOSCOPY (EGD);  Surgeon: Jonathon Bellows, MD;  Location: [facility] ENDOSCOPY;  Service:  Gastroenterology;  Laterality: N/A;   KNEE ARTHROSCOPY Bilateral 2011, 2015   OOPHORECTOMY     TONSILLECTOMY AND ADENOIDECTOMY      Family History  Problem Relation Age of Onset   Hypertension Mother    Hyperlipidemia Mother    Diabetes Mother    Nephrolithiasis Mother        Father   Stroke Mother    Heart disease Mother    Colon polyps Mother    Hypertension Father    Hyperlipidemia Father    Transient ischemic attack Father    Prostate cancer Paternal Grandfather    Deep vein thrombosis Other    Breast cancer Maternal Aunt     Social History Social History   Tobacco Use   Smoking status: Former    Packs/day: 0.25    Years: 20.00    Total pack years: 5.00    Types: Cigarettes   Smokeless tobacco: Never  Vaping Use   Vaping Use: Every day  Substance Use Topics   Alcohol use: Yes    Alcohol/week: 0.0 standard drinks of alcohol    Comment: occasional wine an dliquor   Drug use: No    Allergies  Allergen Reactions   Amoxicillin Hives, Itching and Anaphylaxis   Other Other (See Comments)    Pt allergic to "All meats"   Oxycodone-Acetaminophen     Allergic to the pre-combination    Current Outpatient  Medications  Medication Sig Dispense Refill   butalbital-acetaminophen-caffeine (FIORICET) 50-325-40 MG tablet Take 1 tablet by mouth every 6 (six) hours as needed.     cetirizine (ZYRTEC) 10 MG tablet Take 10 mg by mouth daily.     clonazePAM (KLONOPIN) 1 MG tablet Take 1 mg by mouth 2 (two) times daily.      diphenoxylate-atropine (LOMOTIL) 2.5-0.025 MG tablet Take 1 tablet by mouth.     gabapentin (NEURONTIN) 300 MG capsule Take 300 mg by mouth 3 (three) times daily.     ibuprofen (ADVIL) 600 MG tablet Take 1 tablet (600 mg total) by mouth every 6 (six) hours as needed. 30 tablet 0   metoprolol tartrate (LOPRESSOR) 25 MG tablet Take 25 mg by mouth daily.     Multiple Vitamin (MULTIVITAMIN) capsule Take 1 capsule by mouth daily.     omeprazole (PRILOSEC) 40 MG  capsule Take 1 capsule (40 mg total) by mouth in the morning and at bedtime. 180 capsule 0   Rosuvastatin Calcium 10 MG CPSP      No current facility-administered medications for this visit.     Review of Systems Full ROS  was asked and was negative except for the information on the HPI  Physical Exam Blood pressure 135/80, pulse 61, temperature 98 F (36.7 C), temperature source Oral, height 5\' 4"  (1.626 m), weight 186 lb 12.8 oz (84.7 kg), SpO2 94 %. CONSTITUTIONAL: NAD. EYES: Pupils are equal, round, and reactive to light, Sclera are non-icteric. EARS, NOSE, MOUTH AND THROAT: The oropharynx is clear. The oral mucosa is pink and moist. Hearing is intact to voice. LYMPH NODES:  Lymph nodes in the neck are normal. RESPIRATORY:  Lungs are clear. There is normal respiratory effort, with equal breath sounds bilaterally, and without pathologic use of accessory muscles. CARDIOVASCULAR: Heart is regular without murmurs, gallops, or rubs. Right chest wall tenderness exquisite GI: The abdomen is  soft, Tender RUQ, no peritonitis. There are no palpable masses. There is no hepatosplenomegaly. There are normal bowel sounds in all quadrants. GU: Rectal deferred.   MUSCULOSKELETAL: Normal muscle strength and tone. No cyanosis or edema.   SKIN: Turgor is good and there are no pathologic skin lesions or ulcers. NEUROLOGIC: Motor and sensation is grossly normal. Cranial nerves are grossly intact. PSYCH:  Oriented to person, place and time. Affect is normal.  Data Reviewed  I have personally reviewed the patient's imaging, laboratory findings and medical records.    Assessment/Plan Chronic and recurrent right upper quadrant and lower quadrant abdominal pain. Discussed with the patient in detail I do agree to follow HIDA scan and a repeat ultrasound to assess for potential gallstone and function of the gallbladder.  It is nonclassical for biliary colic but certainly some of her symptoms could be  explained by biliary pathology.  I will continue to follow her after she completes appropriate workup. Please note that I spent 45 minutes in this encounter including personally reviewing imaging studies, coordinating his care, placing orders and performing appropriate documentation.    Caroleen Hamman, MD FACS General Surgeon 08/29/2022, 3:57 PM

## 2022-08-29 NOTE — Progress Notes (Signed)
Normal

## 2022-08-30 ENCOUNTER — Ambulatory Visit
Admission: RE | Admit: 2022-08-30 | Discharge: 2022-08-30 | Disposition: A | Discharge status: Home / Self Care | Payer: Medicaid Other | Source: Ambulatory Visit | Attending: Surgery | Admitting: Surgery

## 2022-08-30 DIAGNOSIS — R1011 Right upper quadrant pain: Secondary | ICD-10-CM | POA: Insufficient documentation

## 2022-08-30 LAB — GI PROFILE, STOOL, PCR

## 2022-08-30 LAB — CELIAC DISEASE AB SCREEN W/RFX

## 2022-08-30 LAB — CALPROTECTIN, FECAL: Calprotectin, Fecal: 22 ug/g (ref 0–120)

## 2022-09-01 ENCOUNTER — Encounter: Payer: Self-pay | Admitting: Gastroenterology

## 2022-09-03 ENCOUNTER — Telehealth: Payer: Self-pay

## 2022-09-03 NOTE — Telephone Encounter (Signed)
-----  Message from Jonathon Bellows, MD sent at 08/29/2022  7:37 AM EST ----- Normal

## 2022-09-03 NOTE — Telephone Encounter (Signed)
Called patient to let her know that her image was normal. Patient understood and had no further questions.

## 2022-09-04 ENCOUNTER — Ambulatory Visit: Payer: Medicaid Other | Admitting: Surgery

## 2022-09-16 ENCOUNTER — Ambulatory Visit: Payer: Medicaid Other | Admitting: Gastroenterology

## 2022-09-16 ENCOUNTER — Encounter: Payer: Self-pay | Admitting: Gastroenterology

## 2022-09-16 ENCOUNTER — Telehealth: Payer: Self-pay | Admitting: Gastroenterology

## 2022-09-16 NOTE — Telephone Encounter (Signed)
Patient called stating that she woke up this morning feeling very sick and wants to reschedule her appointment for today 09/16/2022.

## 2022-09-23 ENCOUNTER — Other Ambulatory Visit: Payer: Self-pay

## 2022-09-23 ENCOUNTER — Encounter: Payer: Self-pay | Admitting: Surgery

## 2022-09-23 ENCOUNTER — Ambulatory Visit (INDEPENDENT_AMBULATORY_CARE_PROVIDER_SITE_OTHER): Payer: Medicaid Other | Admitting: Surgery

## 2022-09-23 ENCOUNTER — Telehealth: Payer: Self-pay | Admitting: Surgery

## 2022-09-23 VITALS — BP 132/80 | HR 59 | Temp 98.6°F | Ht 64.0 in | Wt 186.0 lb

## 2022-09-23 DIAGNOSIS — K439 Ventral hernia without obstruction or gangrene: Secondary | ICD-10-CM | POA: Diagnosis not present

## 2022-09-23 NOTE — H&P (View-Only) (Signed)
Surgical Consultation  09/23/2022  Amber Campos is an 47 y.o. female.   Chief Complaint  Patient presents with   Follow-up    Right sided pain radiates to navel     HPI: 47 year old female seen for epigastric pain.  Initially thought to be of biliary origin.  Currently the patient had constipation followed by diarrhea and severe abdominal pain in the epigastric area.  She did complete a HIDA scan that was completely normal.  Please note that I have personally reviewed.  I have also reviewed the ultrasound that was normal.  I once again reviewed the CT scan and did find some matter-of-fact an epigastric defect.  There is no evidence of incarceration or strangulation.  She pence points to the epigastric area very definitively. Continues to experience intermittent pain that is moderate to severe intensity sharp in nature.  Past Medical History:  Diagnosis Date   Anxiety    Chronic constipation    Colon polyp 09/2014   Depression    Dyspareunia    Dysphagia    GERD (gastroesophageal reflux disease)    History of chickenpox    History of recurrent UTI (urinary tract infection)    HLD (hyperlipidemia)    HTN (hypertension)    Hypogammaglobulinemia (HCC)    Migraine    Perimenopausal atrophic vaginitis    Urinary frequency     Past Surgical History:  Procedure Laterality Date   ABDOMINAL HYSTERECTOMY  2015   Complete   ABDOMINAL HYSTERECTOMY     APPENDECTOMY     CESAREAN SECTION     COLONOSCOPY  09/2014   polyp removed   COLONOSCOPY WITH PROPOFOL N/A 08/21/2022   Procedure: COLONOSCOPY WITH PROPOFOL;  Surgeon: Jonathon Bellows, MD;  Location: [facility] ENDOSCOPY;  Service: Gastroenterology;  Laterality: N/A;   ENDOMETRIAL ABLATION     ESOPHAGOGASTRODUODENOSCOPY  09/2014   ESOPHAGOGASTRODUODENOSCOPY N/A 08/21/2022   Procedure: ESOPHAGOGASTRODUODENOSCOPY (EGD);  Surgeon: Jonathon Bellows, MD;  Location: [facility] ENDOSCOPY;  Service: Gastroenterology;  Laterality: N/A;   KNEE ARTHROSCOPY  Bilateral 2011, 2015   OOPHORECTOMY     TONSILLECTOMY AND ADENOIDECTOMY      Family History  Problem Relation Age of Onset   Hypertension Mother    Hyperlipidemia Mother    Diabetes Mother    Nephrolithiasis Mother        Father   Stroke Mother    Heart disease Mother    Colon polyps Mother    Hypertension Father    Hyperlipidemia Father    Transient ischemic attack Father    Prostate cancer Paternal Grandfather    Deep vein thrombosis Other    Breast cancer Maternal Aunt     Social History:  reports that she has quit smoking. Her smoking use included cigarettes. She has a 5.00 pack-year smoking history. She has never used smokeless tobacco. She reports current alcohol use. She reports that she does not use drugs.  Allergies:  Allergies  Allergen Reactions   Amoxicillin Hives, Itching and Anaphylaxis   Other Other (See Comments)    Pt allergic to "All meats"   Oxycodone-Acetaminophen     Allergic to the pre-combination    Medications reviewed.     ROS Full ROS performed and is otherwise negative other than what is stated in the HPI    BP 132/80   Pulse (!) 59   Temp 98.6 F (37 C) (Oral)   Ht 5' 4"$  (1.626 m)   Wt 186 lb (84.4 kg)   SpO2 98%  BMI 31.93 kg/m   Physical Exam CONSTITUTIONAL: NAD. EYES: Pupils are equal, round,  Sclera are non-icteric. EARS, NOSE, MOUTH AND THROAT: The oropharynx is clear. The oral mucosa is pink and moist. Hearing is intact to voice. LYMPH NODES:  Lymph nodes in the neck are normal. RESPIRATORY:  Lungs are clear. There is normal respiratory effort, with equal breath sounds bilaterally, and without pathologic use of accessory muscles. CARDIOVASCULAR: Heart is regular without murmurs, gallops, or rubs. GI: The abdomen is  soft, Tender  Epigastric area, reducible 3 cms epigastric hernia.There are no palpable masses. There is no hepatosplenomegaly. There are normal bowel sounds in all quadrants. GU: Rectal deferred.    MUSCULOSKELETAL: Normal muscle strength and tone. No cyanosis or edema.   SKIN: Turgor is good and there are no pathologic skin lesions or ulcers. NEUROLOGIC: Motor and sensation is grossly normal. Cranial nerves are grossly intact. PSYCH:  Oriented to person, place and time. Affect is normal.  Assessment/Plan: 47 year old female with epigastric pain.  On exam today it is evident that she has an epigastric hernia.  I have confirmed this and the CT scan definitely shows a defect as well.  I do think that we will need to fix it.  Do think that open approach is more than appropriate.  Procedure discussed with patient detail.  Risk, benefits and possible complications including but not limited to: Bleeding, infection injury to adjacent structures, mesh issues and chronic pain she understands and wished to proceed. Please note that I spent 40 minutes in this encounter including personally reviewing imaging studies, coordinating his care, placing orders and performing appropriate documentatio  Caroleen Hamman, MD [facility] General Surgeon dermatitis

## 2022-09-23 NOTE — Progress Notes (Signed)
Surgical Consultation  09/23/2022  Amber Campos is an 47 y.o. female.   Chief Complaint  Patient presents with   Follow-up    Right sided pain radiates to navel     HPI: 47 year old female seen for epigastric pain.  Initially thought to be of biliary origin.  Currently the patient had constipation followed by diarrhea and severe abdominal pain in the epigastric area.  She did complete a HIDA scan that was completely normal.  Please note that I have personally reviewed.  I have also reviewed the ultrasound that was normal.  I once again reviewed the CT scan and did find some matter-of-fact an epigastric defect.  There is no evidence of incarceration or strangulation.  She pence points to the epigastric area very definitively. Continues to experience intermittent pain that is moderate to severe intensity sharp in nature.  Past Medical History:  Diagnosis Date   Anxiety    Chronic constipation    Colon polyp 09/2014   Depression    Dyspareunia    Dysphagia    GERD (gastroesophageal reflux disease)    History of chickenpox    History of recurrent UTI (urinary tract infection)    HLD (hyperlipidemia)    HTN (hypertension)    Hypogammaglobulinemia (HCC)    Migraine    Perimenopausal atrophic vaginitis    Urinary frequency     Past Surgical History:  Procedure Laterality Date   ABDOMINAL HYSTERECTOMY  2015   Complete   ABDOMINAL HYSTERECTOMY     APPENDECTOMY     CESAREAN SECTION     COLONOSCOPY  09/2014   polyp removed   COLONOSCOPY WITH PROPOFOL N/A 08/21/2022   Procedure: COLONOSCOPY WITH PROPOFOL;  Surgeon: Jonathon Bellows, MD;  Location: [facility] ENDOSCOPY;  Service: Gastroenterology;  Laterality: N/A;   ENDOMETRIAL ABLATION     ESOPHAGOGASTRODUODENOSCOPY  09/2014   ESOPHAGOGASTRODUODENOSCOPY N/A 08/21/2022   Procedure: ESOPHAGOGASTRODUODENOSCOPY (EGD);  Surgeon: Jonathon Bellows, MD;  Location: [facility] ENDOSCOPY;  Service: Gastroenterology;  Laterality: N/A;   KNEE ARTHROSCOPY  Bilateral 2011, 2015   OOPHORECTOMY     TONSILLECTOMY AND ADENOIDECTOMY      Family History  Problem Relation Age of Onset   Hypertension Mother    Hyperlipidemia Mother    Diabetes Mother    Nephrolithiasis Mother        Father   Stroke Mother    Heart disease Mother    Colon polyps Mother    Hypertension Father    Hyperlipidemia Father    Transient ischemic attack Father    Prostate cancer Paternal Grandfather    Deep vein thrombosis Other    Breast cancer Maternal Aunt     Social History:  reports that she has quit smoking. Her smoking use included cigarettes. She has a 5.00 pack-year smoking history. She has never used smokeless tobacco. She reports current alcohol use. She reports that she does not use drugs.  Allergies:  Allergies  Allergen Reactions   Amoxicillin Hives, Itching and Anaphylaxis   Other Other (See Comments)    Pt allergic to "All meats"   Oxycodone-Acetaminophen     Allergic to the pre-combination    Medications reviewed.     ROS Full ROS performed and is otherwise negative other than what is stated in the HPI    BP 132/80   Pulse (!) 59   Temp 98.6 F (37 C) (Oral)   Ht 5' 4"$  (1.626 m)   Wt 186 lb (84.4 kg)   SpO2 98%  BMI 31.93 kg/m   Physical Exam CONSTITUTIONAL: NAD. EYES: Pupils are equal, round,  Sclera are non-icteric. EARS, NOSE, MOUTH AND THROAT: The oropharynx is clear. The oral mucosa is pink and moist. Hearing is intact to voice. LYMPH NODES:  Lymph nodes in the neck are normal. RESPIRATORY:  Lungs are clear. There is normal respiratory effort, with equal breath sounds bilaterally, and without pathologic use of accessory muscles. CARDIOVASCULAR: Heart is regular without murmurs, gallops, or rubs. GI: The abdomen is  soft, Tender  Epigastric area, reducible 3 cms epigastric hernia.There are no palpable masses. There is no hepatosplenomegaly. There are normal bowel sounds in all quadrants. GU: Rectal deferred.    MUSCULOSKELETAL: Normal muscle strength and tone. No cyanosis or edema.   SKIN: Turgor is good and there are no pathologic skin lesions or ulcers. NEUROLOGIC: Motor and sensation is grossly normal. Cranial nerves are grossly intact. PSYCH:  Oriented to person, place and time. Affect is normal.  Assessment/Plan: 47 year old female with epigastric pain.  On exam today it is evident that she has an epigastric hernia.  I have confirmed this and the CT scan definitely shows a defect as well.  I do think that we will need to fix it.  Do think that open approach is more than appropriate.  Procedure discussed with patient detail.  Risk, benefits and possible complications including but not limited to: Bleeding, infection injury to adjacent structures, mesh issues and chronic pain she understands and wished to proceed. Please note that I spent 40 minutes in this encounter including personally reviewing imaging studies, coordinating his care, placing orders and performing appropriate documentatio  Caroleen Hamman, MD [facility] Surgeon dermatitis

## 2022-09-23 NOTE — Telephone Encounter (Signed)
Patient has been advised of Pre-Admission date/time, and Surgery date at [facility].  Surgery Date: 09/26/22 Preadmission Testing Date: 09/25/22 (phone 8a-1p)  Patient has been made aware to call [phone], between 1-3:00pm the day before surgery, to find out what time to arrive for surgery.

## 2022-09-23 NOTE — Patient Instructions (Addendum)
Our surgery scheduler Pamala Hurry will call you within 24-48 hours to get you scheduled. If you have not heard from her after 48 hours, please call our office. Have the blue sheet available when she calls to write down important information.   If you have any concerns or questions, please feel free to call our office.   Laparoscopic Ventral Hernia Repair  Laparoscopic ventral hernia repairis a procedure to fix a bulge of tissue that pushes through a weak area of muscle in the abdomen (ventral hernia). A ventral hernia may be: Above the belly button. This is called an epigastric hernia. At the belly button. This is called an umbilical hernia. At the incision site from previous abdominal surgery. This is called an incisional hernia. You may have this procedure as emergency surgery if part of your intestine gets trapped inside the hernia and starts to lose its blood supply (strangulation). Laparoscopic surgery is done through small incisions using a thin surgical telescope with a light and camera (laparoscope). During surgery, your surgeon will use images from the laparoscope to guide the procedure. A mesh screen will be placed in the hernia to close the opening and strengthen the abdominal wall. Tell a health care provider about: Any allergies you have. All medicines you are taking, including vitamins, herbs, eye drops, creams, and over-the-counter medicines. Any problems you or family members have had with anesthetic medicines. Any blood disorders you have. Any surgeries you have had. Any medical conditions you have. Whether you are pregnant or may be pregnant. What are the risks? Generally, this is a safe procedure. However, problems may occur, including: Infection. Bleeding. Damage to nearby structures or organs in the abdomen. Trouble urinating or having a bowel movement after surgery. Blood clots. The hernia coming back after surgery. Fluid buildup in the area of the hernia. In some  cases, your health care provider may need to switch from a laparoscopic procedure to a procedure that is done through a single, larger incision in the abdomen (open procedure). You may need an open procedure if: You have a hernia that is difficult to repair. Your organs are hard to see with the laparoscope. You have bleeding problems during the laparoscopic procedure. What happens before the procedure? Staying hydrated Follow instructions from your health care provider about hydration, which may include: Up to 2 hours before the procedure - you may continue to drink clear liquids, such as water, clear fruit juice, black coffee, and plain tea.  Eating and drinking restrictions Follow instructions from your health care provider about eating and drinking, which may include: 8 hours before the procedure - stop eating heavy meals or foods, such as meat, fried foods, or fatty foods. 6 hours before the procedure - stop eating light meals or foods, such as toast or cereal. 6 hours before the procedure - stop drinking milk or drinks that contain milk. 2 hours before the procedure - stop drinking clear liquids. Medicines Ask your health care provider about: Changing or stopping your regular medicines. This is especially important if you are taking diabetes medicines or blood thinners. Taking medicines such as aspirin and ibuprofen. These medicines can thin your blood. Do not take these medicines unless your health care provider tells you to take them. Taking over-the-counter medicines, vitamins, herbs, and supplements. Tests You may need to have tests before the procedure, such as: Blood tests. Urine tests. Abdominal ultrasound. Chest X-ray. Electrocardiogram (ECG). General instructions You may be asked to take a laxative or do an enema  to empty your bowel before surgery (bowel prep). Do not use any products that contain nicotine or tobacco for at least 4 weeks before the procedure. These products  include cigarettes, chewing tobacco, and vaping devices, such as e-cigarettes. If you need help quitting, ask your health care provider. Ask your health care provider: How your surgery site will be marked. What steps will be taken to help prevent infection. These steps may include: Removing hair at the surgery site. Washing skin with a germ-killing soap. Receiving antibiotic medicine. Plan to have a responsible adult take you home from the hospital or clinic. If you will be going home right after the procedure, plan to have a responsible adult care for you for the time you are told. This is important. What happens during the procedure?  An IV will be inserted into one of your veins. You will be given one or more of the following: A medicine to help you relax (sedative). A medicine to numb the area (local anesthetic). A medicine to make you fall asleep (general anesthetic). A small incision will be made in your abdomen. A hollow metal tube (trocar) will be placed through the incision. A tube will be placed through the trocar to inflate your abdomen with carbon dioxide. This makes it easier for your surgeon to see inside your abdomen during the repair. A laparoscope will be inserted into your abdomen through the trocar. The laparoscope will send images to a monitor in the operating room. Other trocars will be put through other small incisions in your abdomen. The surgical instruments needed for the procedure will be placed through these trocars. The tissue or intestines that make up the hernia will be moved back into place. The edges of the hernia may be stitched (sutured) together. A piece of mesh will be used to close the hernia. Sutures, clips, or staples will be used to keep the mesh in place. A bandage (dressing) or skin glue will be put over the incisions. The procedure may vary among health care providers and hospitals. What happens after the procedure? Your blood pressure, heart rate,  breathing rate, and blood oxygen level will be monitored until you leave the hospital or clinic. You will continue to receive fluids and medicines through an IV. Your IV will be removed when you can drink clear fluids. You will be given pain medicine as needed. You will be encouraged to get up and walk around as soon as possible. You will be shown how to do deep breathing exercises to help prevent a lung infection. If you were given a sedative during the procedure, it can affect you for several hours. Do not drive or operate machinery until your health care provider says that it is safe. Summary Laparoscopic ventral hernia is an operation to fix a hernia using small incisions. Tell your health care provider about other medical conditions that you have and about all the medicines that you are taking. Follow instructions from your health care provider about eating and drinking before the procedure. Plan to have a responsible adult take you home from the hospital or clinic. After the procedure, you will be encouraged to walk as soon as possible. You will also be taught how to do deep breathing exercises. This information is not intended to replace advice given to you by your health care provider. Make sure you discuss any questions you have with your health care provider. Document Revised: 03/17/2020 Document Reviewed: 03/17/2020 Elsevier Patient Education  [place].

## 2022-09-25 ENCOUNTER — Other Ambulatory Visit: Payer: Self-pay

## 2022-09-25 ENCOUNTER — Encounter
Admission: RE | Admit: 2022-09-25 | Discharge: 2022-09-25 | Disposition: A | Discharge status: Home / Self Care | Payer: Medicaid Other | Source: Ambulatory Visit | Attending: Surgery | Admitting: Surgery

## 2022-09-25 HISTORY — DX: Pneumonia, unspecified organism: J18.9

## 2022-09-25 HISTORY — DX: Family history of other specified conditions: Z84.89

## 2022-09-25 HISTORY — DX: Anemia, unspecified: D64.9

## 2022-09-25 HISTORY — DX: Personal history of urinary calculi: Z87.442

## 2022-09-25 MED ORDER — ORAL CARE MOUTH RINSE: 15.0000 mL | Freq: Once | OROMUCOSAL | Status: AC

## 2022-09-25 MED ORDER — CELECOXIB 200 MG PO CAPS: 200.0000 mg | ORAL_CAPSULE | ORAL | Status: AC

## 2022-09-25 MED ORDER — LACTATED RINGERS IV SOLN: INTRAVENOUS | Status: DC

## 2022-09-25 MED ORDER — CHLORHEXIDINE GLUCONATE 0.12 % MT SOLN: 15.0000 mL | Freq: Once | OROMUCOSAL | Status: AC

## 2022-09-25 MED ORDER — CHLORHEXIDINE GLUCONATE CLOTH 2 % EX PADS: 6.0000 | MEDICATED_PAD | Freq: Once | CUTANEOUS | Status: DC

## 2022-09-25 MED ORDER — GABAPENTIN 300 MG PO CAPS: 300.0000 mg | ORAL_CAPSULE | ORAL | Status: AC

## 2022-09-25 MED ORDER — VANCOMYCIN HCL IN DEXTROSE 1-5 GM/200ML-% IV SOLN
1000.0000 mg | INTRAVENOUS | Status: AC
  Administered 2022-09-26: 1000 mg via INTRAVENOUS

## 2022-09-25 NOTE — Patient Instructions (Addendum)
Your procedure is scheduled on: Report to the Registration Desk on the 1st floor of the [facility]. To find out your arrival time, please call [phone] between 1PM - 3PM on: If your arrival time is 6:00 am, do not arrive before that time as the [facility] entrance doors do not open until 6:00 am.  REMEMBER: Instructions that are not followed completely may result in serious medical risk, up to and including death; or upon the discretion of your surgeon and anesthesiologist your surgery may need to be rescheduled.  Do not eat food after midnight the night before surgery.  No gum chewing or hard candies.  You may however, drink CLEAR liquids up to 2 hours before you are scheduled to arrive for your surgery. Do not drink anything within 2 hours of your scheduled arrival time.  Clear liquids include: - water  - apple juice without pulp - gatorade (not RED colors) - black coffee or tea (Do NOT add milk or creamers to the coffee or tea) Do NOT drink anything that is not on this list.  One week prior to surgery: Stop Anti-inflammatories (NSAIDS) such as Advil, Aleve, Ibuprofen, Motrin, Naproxen, Naprosyn and Aspirin based products such as Excedrin, Goody's Powder, BC Powder.  Stop ANY OVER THE COUNTER supplements until after surgery.  You may  take Tylenol if needed for pain up until the day of surgery.  Continue taking all prescribed medications with the exception of the following:  **Follow guidelines for insulin and diabetes medications**  Follow recommendations from Cardiologist or PCP regarding stopping blood thinners.  TAKE ONLY THESE MEDICATIONS THE MORNING OF SURGERY WITH A SIP OF WATER:  metoprolol tartrate (LOPRESSOR)   omeprazole (PRILOSEC)  (take one the night before and one on the morning of surgery - helps to prevent nausea after surgery.)   No Alcohol for 24 hours before or after surgery.  No Smoking including e-cigarettes for 24 hours before surgery.  No  chewable tobacco products for at least 6 hours before surgery.  No nicotine patches on the day of surgery.  Do not use any "recreational" drugs for at least a week (preferably 2 weeks) before your surgery.  Please be advised that the combination of cocaine and anesthesia may have negative outcomes, up to and including death. If you test positive for cocaine, your surgery will be cancelled.  On the morning of surgery brush your teeth with toothpaste and water, you may rinse your mouth with mouthwash if you wish. Do not swallow any toothpaste or mouthwash.  Use CHG Soap or wipes as directed on instruction sheet.  Do not wear jewelry, make-up, hairpins, clips or nail polish.  Do not wear lotions, powders, or perfumes.   Do not shave body hair from the neck down 48 hours before surgery.  Contact lenses, hearing aids and dentures may not be worn into surgery.  Do not bring valuables to the hospital. [facility] is not responsible for any missing/lost belongings or valuables.   Notify your doctor if there is any change in your medical condition (cold, fever, infection).  Wear comfortable clothing (specific to your surgery type) to the hospital.  After surgery, you can help prevent lung complications by doing breathing exercises.  Take deep breaths and cough every 1-2 hours. Your doctor may order a device called an Incentive Spirometer to help you take deep breaths. When coughing or sneezing, hold a pillow firmly against your incision with both hands. This is called "splinting." Doing this helps  protect your incision. It also decreases belly discomfort.  If you are being admitted to the hospital overnight, leave your suitcase in the car. After surgery it may be brought to your room.  In case of increased patient census, it may be necessary for you, the patient, to continue your postoperative care in the Same Day Surgery department.  If you are being discharged the day of surgery, you will  not be allowed to drive home. You will need a responsible individual to drive you home and stay with you for 24 hours after surgery.   If you are taking public transportation, you will need to have a responsible individual with you.  Please call the [facility] Dept. at [phone] if you have any questions about these instructions.  Surgery Visitation Policy:  Patients undergoing a surgery or procedure may have two family members or support persons with them as long as the person is not COVID-19 positive or experiencing its symptoms.   Inpatient Visitation:    Visiting hours are 7 a.m. to 8 p.m. Up to four visitors are allowed at one time in a patient room. The visitors may rotate out with other people during the day. One designated support person (adult) may remain overnight.  Due to an increase in RSV and influenza rates and associated hospitalizations, children ages 33 and under will not be able to visit patients in [facility]. Masks continue to be strongly recommended.

## 2022-09-26 ENCOUNTER — Ambulatory Visit
Admission: RE | Admit: 2022-09-26 | Discharge: 2022-09-26 | Disposition: A | Discharge status: Home / Self Care | Payer: Medicaid Other | Source: Ambulatory Visit | Attending: Surgery | Admitting: Surgery

## 2022-09-26 ENCOUNTER — Ambulatory Visit: Payer: Medicaid Other | Admitting: Urgent Care

## 2022-09-26 ENCOUNTER — Other Ambulatory Visit: Payer: Self-pay

## 2022-09-26 ENCOUNTER — Encounter
Admission: RE | Disposition: A | Discharge status: Home / Self Care | Payer: Self-pay | Source: Ambulatory Visit | Attending: Surgery

## 2022-09-26 ENCOUNTER — Encounter: Payer: Self-pay | Admitting: Surgery

## 2022-09-26 DIAGNOSIS — Z87891 Personal history of nicotine dependence: Secondary | ICD-10-CM | POA: Diagnosis not present

## 2022-09-26 DIAGNOSIS — R519 Headache, unspecified: Secondary | ICD-10-CM | POA: Insufficient documentation

## 2022-09-26 DIAGNOSIS — F32A Depression, unspecified: Secondary | ICD-10-CM | POA: Insufficient documentation

## 2022-09-26 DIAGNOSIS — Z6831 Body mass index (BMI) 31.0-31.9, adult: Secondary | ICD-10-CM | POA: Insufficient documentation

## 2022-09-26 DIAGNOSIS — I10 Essential (primary) hypertension: Secondary | ICD-10-CM | POA: Insufficient documentation

## 2022-09-26 DIAGNOSIS — D649 Anemia, unspecified: Secondary | ICD-10-CM | POA: Insufficient documentation

## 2022-09-26 DIAGNOSIS — K219 Gastro-esophageal reflux disease without esophagitis: Secondary | ICD-10-CM | POA: Diagnosis not present

## 2022-09-26 DIAGNOSIS — K439 Ventral hernia without obstruction or gangrene: Secondary | ICD-10-CM

## 2022-09-26 DIAGNOSIS — E669 Obesity, unspecified: Secondary | ICD-10-CM | POA: Diagnosis not present

## 2022-09-26 DIAGNOSIS — R1310 Dysphagia, unspecified: Secondary | ICD-10-CM | POA: Diagnosis not present

## 2022-09-26 DIAGNOSIS — K436 Other and unspecified ventral hernia with obstruction, without gangrene: Secondary | ICD-10-CM

## 2022-09-26 DIAGNOSIS — F419 Anxiety disorder, unspecified: Secondary | ICD-10-CM | POA: Insufficient documentation

## 2022-09-26 HISTORY — PX: INSERTION OF MESH: SHX5868

## 2022-09-26 HISTORY — PX: EPIGASTRIC HERNIA REPAIR: SHX404

## 2022-09-26 SURGERY — REPAIR, HERNIA, EPIGASTRIC, ADULT
Anesthesia: General | Site: Abdomen

## 2022-09-26 MED ORDER — MIDAZOLAM HCL 2 MG/2ML IJ SOLN
INTRAMUSCULAR | Status: DC | PRN
  Administered 2022-09-26: 2 mg via INTRAVENOUS

## 2022-09-26 MED ORDER — HYDROCODONE-ACETAMINOPHEN 5-325 MG PO TABS: 1.0000 | ORAL_TABLET | ORAL | 0 refills | Status: DC | PRN

## 2022-09-26 MED ORDER — SUGAMMADEX SODIUM 200 MG/2ML IV SOLN
INTRAVENOUS | Status: DC | PRN
  Administered 2022-09-26: 200 mg via INTRAVENOUS

## 2022-09-26 MED ORDER — ACETAMINOPHEN 10 MG/ML IV SOLN
INTRAVENOUS | Status: AC
  Filled 2022-09-26: qty 100

## 2022-09-26 MED ORDER — LIDOCAINE HCL (CARDIAC) PF 100 MG/5ML IV SOSY
PREFILLED_SYRINGE | INTRAVENOUS | Status: DC | PRN
  Administered 2022-09-26: 80 mg via INTRAVENOUS

## 2022-09-26 MED ORDER — CELECOXIB 200 MG PO CAPS
ORAL_CAPSULE | ORAL | Status: AC
  Administered 2022-09-26: 200 mg via ORAL
  Filled 2022-09-26: qty 1

## 2022-09-26 MED ORDER — CHLORHEXIDINE GLUCONATE 0.12 % MT SOLN
OROMUCOSAL | Status: AC
  Administered 2022-09-26: 15 mL via OROMUCOSAL
  Filled 2022-09-26: qty 15

## 2022-09-26 MED ORDER — ROCURONIUM BROMIDE 100 MG/10ML IV SOLN
INTRAVENOUS | Status: DC | PRN
  Administered 2022-09-26: 50 mg via INTRAVENOUS

## 2022-09-26 MED ORDER — GABAPENTIN 300 MG PO CAPS
ORAL_CAPSULE | ORAL | Status: AC
  Administered 2022-09-26: 300 mg via ORAL
  Filled 2022-09-26: qty 1

## 2022-09-26 MED ORDER — DEXMEDETOMIDINE HCL IN NACL 80 MCG/20ML IV SOLN
INTRAVENOUS | Status: DC | PRN
  Administered 2022-09-26: 4 ug via BUCCAL
  Administered 2022-09-26: 8 ug via BUCCAL

## 2022-09-26 MED ORDER — BUPIVACAINE LIPOSOME 1.3 % IJ SUSP
INTRAMUSCULAR | Status: DC | PRN
  Administered 2022-09-26: 20 mL

## 2022-09-26 MED ORDER — ONDANSETRON HCL 4 MG/2ML IJ SOLN
INTRAMUSCULAR | Status: DC | PRN
  Administered 2022-09-26: 4 mg via INTRAVENOUS

## 2022-09-26 MED ORDER — ACETAMINOPHEN 10 MG/ML IV SOLN
INTRAVENOUS | Status: DC | PRN
  Administered 2022-09-26: 1000 mg via INTRAVENOUS

## 2022-09-26 MED ORDER — OXYCODONE HCL 5 MG PO TABS
ORAL_TABLET | ORAL | Status: AC
  Filled 2022-09-26: qty 1

## 2022-09-26 MED ORDER — GLYCOPYRROLATE 0.2 MG/ML IJ SOLN
INTRAMUSCULAR | Status: DC | PRN
  Administered 2022-09-26: .2 mg via INTRAVENOUS

## 2022-09-26 MED ORDER — OXYCODONE HCL 5 MG PO TABS
5.0000 mg | ORAL_TABLET | Freq: Once | ORAL | Status: AC
  Administered 2022-09-26: 5 mg via ORAL

## 2022-09-26 MED ORDER — LACTATED RINGERS IV SOLN: INTRAVENOUS | Status: DC

## 2022-09-26 MED ORDER — ONDANSETRON HCL 4 MG/2ML IJ SOLN
INTRAMUSCULAR | Status: AC
  Filled 2022-09-26: qty 2

## 2022-09-26 MED ORDER — DEXAMETHASONE SODIUM PHOSPHATE 10 MG/ML IJ SOLN
INTRAMUSCULAR | Status: AC
  Filled 2022-09-26: qty 1

## 2022-09-26 MED ORDER — EPINEPHRINE PF 1 MG/ML IJ SOLN
INTRAMUSCULAR | Status: AC
  Filled 2022-09-26: qty 1

## 2022-09-26 MED ORDER — FENTANYL CITRATE (PF) 100 MCG/2ML IJ SOLN
INTRAMUSCULAR | Status: AC
  Filled 2022-09-26: qty 2

## 2022-09-26 MED ORDER — LIDOCAINE HCL (PF) 2 % IJ SOLN
INTRAMUSCULAR | Status: AC
  Filled 2022-09-26: qty 5

## 2022-09-26 MED ORDER — FENTANYL CITRATE (PF) 100 MCG/2ML IJ SOLN
INTRAMUSCULAR | Status: DC | PRN
  Administered 2022-09-26: 50 ug via INTRAVENOUS
  Administered 2022-09-26: 100 ug via INTRAVENOUS
  Administered 2022-09-26: 50 ug via INTRAVENOUS

## 2022-09-26 MED ORDER — DEXAMETHASONE SODIUM PHOSPHATE 10 MG/ML IJ SOLN
INTRAMUSCULAR | Status: DC | PRN
  Administered 2022-09-26: 10 mg via INTRAVENOUS

## 2022-09-26 MED ORDER — VANCOMYCIN HCL IN DEXTROSE 1-5 GM/200ML-% IV SOLN
INTRAVENOUS | Status: AC
  Filled 2022-09-26: qty 200

## 2022-09-26 MED ORDER — BUPIVACAINE-EPINEPHRINE (PF) 0.25% -1:200000 IJ SOLN
INTRAMUSCULAR | Status: DC | PRN
  Administered 2022-09-26: 30 mL

## 2022-09-26 MED ORDER — ONDANSETRON HCL 4 MG/2ML IJ SOLN: 4.0000 mg | Freq: Once | INTRAMUSCULAR | Status: DC | PRN

## 2022-09-26 MED ORDER — PROPOFOL 10 MG/ML IV BOLUS
INTRAVENOUS | Status: DC | PRN
  Administered 2022-09-26: 200 mg via INTRAVENOUS

## 2022-09-26 MED ORDER — MIDAZOLAM HCL 2 MG/2ML IJ SOLN
INTRAMUSCULAR | Status: AC
  Filled 2022-09-26: qty 2

## 2022-09-26 MED ORDER — BUPIVACAINE HCL (PF) 0.25 % IJ SOLN
INTRAMUSCULAR | Status: AC
  Filled 2022-09-26: qty 30

## 2022-09-26 MED ORDER — PROPOFOL 10 MG/ML IV BOLUS
INTRAVENOUS | Status: AC
  Filled 2022-09-26: qty 20

## 2022-09-26 MED ORDER — BUPIVACAINE LIPOSOME 1.3 % IJ SUSP
INTRAMUSCULAR | Status: AC
  Filled 2022-09-26: qty 20

## 2022-09-26 MED ORDER — FENTANYL CITRATE (PF) 100 MCG/2ML IJ SOLN: 25.0000 ug | INTRAMUSCULAR | Status: DC | PRN

## 2022-09-26 SURGICAL SUPPLY — 40 items
ADH SKN CLS APL DERMABOND .7 (GAUZE/BANDAGES/DRESSINGS) ×1
APL PRP STRL LF DISP 70% ISPRP (MISCELLANEOUS) ×1
APPLIER CLIP 11 MED OPEN (CLIP)
APPLIER CLIP 13 LRG OPEN (CLIP)
APR CLP LRG 13 20 CLIP (CLIP)
APR CLP MED 11 20 MLT OPN (CLIP)
BLADE CLIPPER SURG (BLADE) ×1 IMPLANT
CHLORAPREP W/TINT 26 (MISCELLANEOUS) ×1 IMPLANT
CLIP APPLIE 11 MED OPEN (CLIP) ×1 IMPLANT
CLIP APPLIE 13 LRG OPEN (CLIP) ×1 IMPLANT
DERMABOND ADVANCED .7 DNX12 (GAUZE/BANDAGES/DRESSINGS) IMPLANT
DRAPE LAPAROTOMY 100X77 ABD (DRAPES) ×1 IMPLANT
DRSG TELFA 3X8 NADH STRL (GAUZE/BANDAGES/DRESSINGS) ×1 IMPLANT
ELECT CAUTERY BLADE 6.4 (BLADE) ×1 IMPLANT
ELECT REM PT RETURN 9FT ADLT (ELECTROSURGICAL) ×1
ELECTRODE REM PT RTRN 9FT ADLT (ELECTROSURGICAL) ×1 IMPLANT
GAUZE 4X4 16PLY ~~LOC~~+RFID DBL (SPONGE) ×1 IMPLANT
GAUZE SPONGE 4X4 12PLY STRL (GAUZE/BANDAGES/DRESSINGS) ×1 IMPLANT
GLOVE BIO SURGEON STRL SZ7 (GLOVE) ×1 IMPLANT
GOWN STRL REUS W/ TWL LRG LVL3 (GOWN DISPOSABLE) ×2 IMPLANT
GOWN STRL REUS W/TWL LRG LVL3 (GOWN DISPOSABLE) ×2
MANIFOLD NEPTUNE II (INSTRUMENTS) ×1 IMPLANT
MESH VENTRALEX ST 2.5 CRC MED (Mesh General) IMPLANT
NDL HYPO 25X1 1.5 SAFETY (NEEDLE) ×1 IMPLANT
NEEDLE HYPO 22GX1.5 SAFETY (NEEDLE) ×1 IMPLANT
NEEDLE HYPO 25X1 1.5 SAFETY (NEEDLE) ×1 IMPLANT
PACK BASIN MINOR ARMC (MISCELLANEOUS) ×1 IMPLANT
SPONGE T-LAP 18X18 ~~LOC~~+RFID (SPONGE) ×1 IMPLANT
STAPLER SKIN PROX 35W (STAPLE) ×1 IMPLANT
SUT ETHIBOND 0 MO6 C/R (SUTURE) ×1 IMPLANT
SUT MNCRL 4-0 (SUTURE) ×1
SUT MNCRL 4-0 27XMFL (SUTURE) ×1
SUT VIC AB 2-0 SH 27 (SUTURE) ×1
SUT VIC AB 2-0 SH 27XBRD (SUTURE) ×2 IMPLANT
SUTURE MNCRL 4-0 27XMF (SUTURE) IMPLANT
SYR 20ML LL LF (SYRINGE) ×1 IMPLANT
TAPE MICROFOAM 4IN (TAPE) ×1 IMPLANT
TRAP FLUID SMOKE EVACUATOR (MISCELLANEOUS) ×1 IMPLANT
WATER STERILE IRR 1000ML POUR (IV SOLUTION) ×1 IMPLANT
WATER STERILE IRR 500ML POUR (IV SOLUTION) ×1 IMPLANT

## 2022-09-26 NOTE — Anesthesia Postprocedure Evaluation (Signed)
Anesthesia Post Note  Patient: Amber Campos  Procedure(s) Performed: HERNIA REPAIR EPIGASTRIC ADULT, open (Abdomen) INSERTION OF MESH (Abdomen)  Patient location during evaluation: PACU Anesthesia Type: General Level of consciousness: awake Pain management: pain level controlled Vital Signs Assessment: post-procedure vital signs reviewed and stable Respiratory status: spontaneous breathing Cardiovascular status: stable Anesthetic complications: no   No notable events documented.   Last Vitals:  Vitals:   09/26/22 1100 09/26/22 1111  BP: 132/81 128/89  Pulse: 68 68  Resp: 19 15  Temp: 36.6 C (!) 36 C  SpO2: 96% 95%    Last Pain:  Vitals:   09/26/22 1111  TempSrc: Temporal  PainSc: 4                  VAN STAVEREN,Trevia Nop

## 2022-09-26 NOTE — Anesthesia Procedure Notes (Signed)
Procedure Name: Intubation Date/Time: 09/26/2022 9:07 AM  Performed by: Hedda Slade, CRNAPre-anesthesia Checklist: Patient identified, Patient being monitored, Timeout performed, Emergency Drugs available and Suction available Patient Re-evaluated:Patient Re-evaluated prior to induction Oxygen Delivery Method: Circle system utilized Preoxygenation: Pre-oxygenation with 100% oxygen Induction Type: IV induction Ventilation: Mask ventilation without difficulty Laryngoscope Size: 3 and McGraph Grade View: Grade I Tube type: Oral Tube size: 7.0 mm Number of attempts: 1 Airway Equipment and Method: Stylet Placement Confirmation: ETT inserted through vocal cords under direct vision, positive ETCO2 and breath sounds checked- equal and bilateral Secured at: 21 cm Tube secured with: Tape Dental Injury: Teeth and Oropharynx as per pre-operative assessment

## 2022-09-26 NOTE — Transfer of Care (Signed)
Immediate Anesthesia Transfer of Care Note  Patient: Amber Campos  Procedure(s) Performed: HERNIA REPAIR EPIGASTRIC ADULT, open (Abdomen) INSERTION OF MESH (Abdomen)  Patient Location: PACU  Anesthesia Type:General  Level of Consciousness: awake, alert , and oriented  Airway & Oxygen Therapy: Patient Spontanous Breathing  Post-op Assessment: Report given to RN and Post -op Vital signs reviewed and stable  Post vital signs: Reviewed and stable  Last Vitals:  Vitals Value Taken Time  BP 151/88 09/26/22 1042  Temp 36.7 C 09/26/22 1042  Pulse 83 09/26/22 1045  Resp 21 09/26/22 1045  SpO2 96 % 09/26/22 1045  Vitals shown include unvalidated device data.  Last Pain:  Vitals:   09/26/22 0753  TempSrc: Temporal  PainSc: 6       Patients Stated Pain Goal: 3 ([id number])  Complications: No notable events documented.

## 2022-09-26 NOTE — Op Note (Signed)
Epigastric Hernia Repair with Mesh Partial omentectomy  Pre-operative Diagnosis: epigastric hernia  Post-operative Diagnosis: same  Surgeon: Caroleen Hamman, MD FACS  Anesthesia: Gen. with endotracheal tube   Findings: 3 cm Epigastric hernia with incarcerated omentum  Estimated Blood Loss: 5cc                Specimens: sac, partial omentectomy          Complications: none              Procedure Details  The patient was seen again in the Holding Room. The benefits, complications, treatment options, and expected outcomes were discussed with the patient. The risks of bleeding, infection, recurrence of symptoms, failure to resolve symptoms, bowel injury, mesh placement, mesh infection, any of which could require further surgery were reviewed with the patient. The likelihood of improving the patient's symptoms with return to their baseline status is good.  The patient and/or family concurred with the proposed plan, giving informed consent.  The patient was taken to Operating Room, identified and the procedure verified.  A Time Out was held and the above information confirmed.  Prior to the induction of general anesthesia, antibiotic prophylaxis was administered. VTE prophylaxis was in place. General endotracheal anesthesia was then administered and tolerated well. After the induction, the abdomen was prepped with Chloraprep and draped in the sterile fashion. The patient was positioned in the supine position.   Incision was created with a scalpel over the hernia defect. Electrocautery was used to dissect through subcutaneous tissue, the hernia sac was opened excised.   The hernia was measured. Liposomal marcaine infiltrated to performed TAP block bilaterally under palpation of the abdominal wall. A BARD 6.4 Ventralex small patch selected and secure to the fascia in 4 corners transfascially with 0 ethibonds.. I closed the hernia defect with interrupted 0 Ethibond sutures.   Incision was  closed in a 2 layer fashion with 3-0 Vicryl and 4-0 Monocryl. Dermabond was used to coat the skin.  Patient tolerated procedure well and there were no immediate complications. Needle and laparotomy counts were correct

## 2022-09-26 NOTE — Discharge Instructions (Addendum)
Open Hernia Repair, Adult, Care After What can I expect after the procedure? After the procedure, it is common to have: Mild discomfort. Slight bruising. Mild swelling. Pain in the belly (abdomen). A small amount of blood from the cut from surgery (incision). Follow these instructions at home: Your doctor may give you more specific instructions. If you have problems, call your doctor. Medicines Take over-the-counter and prescription medicines only as told by your doctor. If told, take steps to prevent problems with pooping (constipation). You may need to: Drink enough fluid to keep your pee (urine) pale yellow. Take medicines. You will be told what medicines to take. Eat foods that are high in fiber. These include beans, whole grains, and fresh fruits and vegetables. Limit foods that are high in fat and sugar. These include fried or sweet foods. Ask your doctor if you should avoid driving or using machines while you are taking your medicine. Incision care  Follow instructions from your doctor about how to take care of your incision. Make sure you: Wash your hands with soap and water for at least 20 seconds before and after you change your bandage (dressing). If you cannot use soap and water, use hand sanitizer. Change your bandage. Leave stitches or skin glue in place for at least 2 weeks. Leave tape strips alone unless you are told to take them off. You may trim the edges of the tape strips if they curl up. Check your incision every day for signs of infection. Check for: More redness, swelling, or pain. More fluid or blood. Warmth. Pus or a bad smell. Wear loose, soft clothing while your incision heals. Activity  Rest as told by your doctor. Do not lift anything that is heavier than 10 lb (4.5 kg), or the limit that you are told. Do not play contact sports until your doctor says that this is safe. If you were given a sedative during your procedure, do not drive or use machines  until your doctor says that it is safe. A sedative is a medicine that helps you relax. Return to your normal activities when your doctor says that it is safe. General instructions Do not take baths, swim, or use a hot tub. Ask your doctor about taking showers or sponge baths. Hold a pillow over your belly when you cough or sneeze. This helps with pain. Do not smoke or use any products that contain nicotine or tobacco. If you need help quitting, ask your doctor. Keep all follow-up visits. Contact a doctor if: You have any of these signs of infection in or around your incision: More redness, swelling, or pain. More fluid or blood. Warmth. Pus. A bad smell. You have a fever or chills. You have blood in your poop (stool). You have not pooped (had a bowel movement) in 2-3 days. Medicine does not help your pain. Get help right away if: You have chest pain, or you are short of breath. You feel faint or light-headed. You have very bad pain. You vomit and your pain is worse. You have pain, swelling, or redness in a leg. These symptoms may be an emergency. Get help right away. Call your local emergency services (911 in the [place] Do not wait to see if the symptoms will go away. Do not drive yourself to the hospital. Summary After this procedure, it is common to have mild discomfort, slight bruising, and mild swelling. Follow instructions from your doctor about how to take care of your cut from surgery (incision). Check every  day for signs of infection. Do not lift heavy objects or play contact sports until your doctor says it is safe. Return to your normal activities as told by your doctor. This information is not intended to replace advice given to you by your health care provider. Make sure you discuss any questions you have with your health care provider. Document Revised: 03/13/2020 Document Reviewed: 03/13/2020 Elsevier Patient Education  [place]   The drugs that you were given will stay in your system until tomorrow so for the next 24 hours you should not:  Drive an automobile Make any legal decisions Drink any alcoholic beverage   You may resume regular meals tomorrow.  Today it is better to start with liquids and gradually work up to solid foods.  You may eat anything you prefer, but it is better to start with liquids, then soup and crackers, and gradually work up to solid foods.   Please notify your doctor immediately if you have any unusual bleeding, trouble breathing, redness and pain at the surgery site, drainage, fever, or pain not relieved by medication.    Additional Instructions: PLEASE LEAVE GREEN/TEAL BRACELET ON FOR 4 DAYS        Please contact your physician with any problems or Same Day Surgery at [phone], Monday through Friday 6 am to 4 pm, or [place] at [facility] number at [phone].

## 2022-09-26 NOTE — Interval H&P Note (Signed)
History and Physical Interval Note:  09/26/2022 8:56 AM  Amber Campos  has presented today for surgery, with the diagnosis of hernia 3 cm.  The various methods of treatment have been discussed with the patient and family. After consideration of risks, benefits and other options for treatment, the patient has consented to  Procedure(s): HERNIA REPAIR EPIGASTRIC ADULT, open (N/A) as a surgical intervention.  The patient's history has been reviewed, patient examined, no change in status, stable for surgery.  I have reviewed the patient's chart and labs.  Questions were answered to the patient's satisfaction.     [place]

## 2022-09-26 NOTE — Anesthesia Preprocedure Evaluation (Addendum)
Anesthesia Evaluation  Patient identified by MRN, date of birth, ID band Patient awake    Reviewed: Allergy & Precautions, NPO status , Patient's Chart, lab work & pertinent test results  History of Anesthesia Complications Negative for: history of anesthetic complications  Airway Mallampati: II   Neck ROM: Full    Dental  (+) Missing   Pulmonary former smoker (quit 2018) Current vaping   Pulmonary exam normal breath sounds clear to auscultation       Cardiovascular hypertension, Normal cardiovascular exam Rhythm:Regular Rate:Normal  ECG 09/03/22:  NORMAL SINUS RHYTHM  MINIMAL VOLTAGE CRITERIA FOR LVH, MAY BE NORMAL VARIANT ( R in aVL )     Neuro/Psych  Headaches PSYCHIATRIC DISORDERS Anxiety Depression       GI/Hepatic ,GERD  ,,Dysphagia    Endo/Other  Obesity   Renal/GU Renal disease (neprolithiasis)     Musculoskeletal   Abdominal   Peds  Hematology  (+) Blood dyscrasia, anemia   Anesthesia Other Findings   Reproductive/Obstetrics                             Anesthesia Physical Anesthesia Plan  ASA: 2  Anesthesia Plan: General   Post-op Pain Management:    Induction: Intravenous  PONV Risk Score and Plan: 3 and Ondansetron, Dexamethasone and Treatment may vary due to age or medical condition  Airway Management Planned: Oral ETT  Additional Equipment:   Intra-op Plan:   Post-operative Plan: Extubation in OR  Informed Consent: I have reviewed the patients History and Physical, chart, labs and discussed the procedure including the risks, benefits and alternatives for the proposed anesthesia with the patient or authorized representative who has indicated his/her understanding and acceptance.     Dental advisory given  Plan Discussed with: CRNA  Anesthesia Plan Comments: (Patient consented for risks of anesthesia including but not limited to:  - adverse reactions to  medications - damage to eyes, teeth, lips or other oral mucosa - nerve damage due to positioning  - sore throat or hoarseness - damage to heart, brain, nerves, lungs, other parts of body or loss of life  Informed patient about role of CRNA in peri- and intra-operative care.  Patient voiced understanding.)       Anesthesia Quick Evaluation

## 2022-09-27 ENCOUNTER — Other Ambulatory Visit: Payer: Self-pay

## 2022-09-27 ENCOUNTER — Telehealth: Payer: Self-pay | Admitting: Surgery

## 2022-09-27 LAB — SURGICAL PATHOLOGY

## 2022-09-27 MED ORDER — GABAPENTIN 300 MG PO CAPS: 300.0000 mg | ORAL_CAPSULE | Freq: Three times a day (TID) | ORAL | 0 refills | Status: DC

## 2022-09-27 NOTE — Telephone Encounter (Signed)
Patient had epigastric hernia done on 09/26/22 with Dr. Dahlia Byes.  Patient states "the medicine is not strong enough".  Says she has been alternating with Tylenol and Ibuprofen.  Please advise.  Thank you.

## 2022-10-03 ENCOUNTER — Ambulatory Visit (INDEPENDENT_AMBULATORY_CARE_PROVIDER_SITE_OTHER): Payer: Medicaid Other | Admitting: Physician Assistant

## 2022-10-03 ENCOUNTER — Encounter: Payer: Self-pay | Admitting: Physician Assistant

## 2022-10-03 VITALS — BP 128/82 | HR 91 | Temp 98.4°F | Ht 64.0 in | Wt 182.0 lb

## 2022-10-03 DIAGNOSIS — Z09 Encounter for follow-up examination after completed treatment for conditions other than malignant neoplasm: Secondary | ICD-10-CM | POA: Diagnosis not present

## 2022-10-03 DIAGNOSIS — K436 Other and unspecified ventral hernia with obstruction, without gangrene: Secondary | ICD-10-CM | POA: Diagnosis not present

## 2022-10-03 DIAGNOSIS — K439 Ventral hernia without obstruction or gangrene: Secondary | ICD-10-CM

## 2022-10-03 MED ORDER — HYDROCODONE-ACETAMINOPHEN 5-325 MG PO TABS: 1.0000 | ORAL_TABLET | Freq: Four times a day (QID) | ORAL | 0 refills | Status: DC | PRN

## 2022-10-03 NOTE — Patient Instructions (Signed)

## 2022-10-03 NOTE — Progress Notes (Signed)
[facility] POST-OP OFFICE VISIT  10/03/2022  HPI: Amber Campos is a 47 y.o. female 7 days s/p epigastric hernia (3 cm ) repair and partial omentectomy with Dr Dahlia Byes   She presents today with concerns over her incision and worried it was possibly infected. She, and her family, noticed what they though was drainage from the inferior portion but she also thinks it may be the dermabond.  Still with abdominal pain; excepted at this stage - asking for more pain medications. Is using tylenol, motrin, and ice packs.  No fever, chills, nausea, emesis No issues with constipation  Vital signs: BP 128/82   Pulse 91   Temp 98.4 F (36.9 C) (Oral)   Ht 5' 4"$  (1.626 m)   Wt 182 lb (82.6 kg)   SpO2 96%   BMI 31.24 kg/m    Physical Exam: Constitutional: Well appearing female, NAD Abdomen: Soft, incisional soreness, non-distended, no rebound/guarding Skin: Epigastric incision is healing well. The area of concern is just residue from dermabond that dripped inferiorly, there is no drainage, no erythema. Wound is approximated without issue. No evidence of infection.   Assessment/Plan: This is a 47 y.o. female 7 days s/p epigastric hernia (3 cm ) repair and partial omentectomy with Dr Dahlia Byes    - Pain control prn; I will give her one final refill of narcotic pain medications. She will continue with OTC medications + ice packs  - Reviewed wound care recommendation; no evidence of infection.   - Reviewed lifting restrictions; 6 weeks total  - I will see her again in 2-3 weeks for re-check; She understands to call with questions/concerns in the interim  -- Edison Simon, PA-C [facility] 10/03/2022, 3:49 PM M-F: 7am - 4pm

## 2022-10-04 ENCOUNTER — Other Ambulatory Visit: Payer: Self-pay | Admitting: Physician Assistant

## 2022-10-04 MED ORDER — HYDROCODONE-ACETAMINOPHEN 5-325 MG PO TABS: 1.0000 | ORAL_TABLET | Freq: Four times a day (QID) | ORAL | 0 refills | Status: DC | PRN

## 2022-10-08 ENCOUNTER — Telehealth: Payer: Self-pay | Admitting: Gastroenterology

## 2022-10-08 ENCOUNTER — Ambulatory Visit: Payer: Medicaid Other | Admitting: Gastroenterology

## 2022-10-08 NOTE — Telephone Encounter (Signed)
11/12/2022 at 2:30 PM

## 2022-10-08 NOTE — Telephone Encounter (Signed)
Patient called stating she is sick with a fever and needs to reschedule her appointment. Where do I need to schedule her?   Same day cancellation **

## 2022-10-09 ENCOUNTER — Encounter: Payer: Medicaid Other | Admitting: Surgery

## 2022-10-17 ENCOUNTER — Encounter: Payer: Medicaid Other | Admitting: Physician Assistant

## 2022-10-22 ENCOUNTER — Encounter: Payer: Medicaid Other | Admitting: Physician Assistant

## 2022-10-24 ENCOUNTER — Encounter: Payer: Medicaid Other | Admitting: Physician Assistant

## 2022-10-24 ENCOUNTER — Encounter: Payer: Self-pay | Admitting: Physician Assistant

## 2022-10-24 ENCOUNTER — Ambulatory Visit (INDEPENDENT_AMBULATORY_CARE_PROVIDER_SITE_OTHER): Payer: Medicaid Other | Admitting: Physician Assistant

## 2022-10-24 VITALS — BP 144/84 | HR 65 | Temp 98.0°F | Ht 64.0 in | Wt 184.0 lb

## 2022-10-24 DIAGNOSIS — Z09 Encounter for follow-up examination after completed treatment for conditions other than malignant neoplasm: Secondary | ICD-10-CM | POA: Diagnosis not present

## 2022-10-24 DIAGNOSIS — K436 Other and unspecified ventral hernia with obstruction, without gangrene: Secondary | ICD-10-CM | POA: Diagnosis not present

## 2022-10-24 DIAGNOSIS — K439 Ventral hernia without obstruction or gangrene: Secondary | ICD-10-CM

## 2022-10-24 NOTE — Progress Notes (Signed)
[facility] POST-OP OFFICE VISIT  10/24/2022  HPI: Amber Campos is a 47 y.o. female 4 weeks s/p epigastric hernia (3 cm ) repair and partial omentectomy with Dr Dahlia Byes    She is doing much better No pain aside from some with certainly movements; mild No fever, chills, nausea, emesis Incision is well healed No other complaints   Vital signs: BP (!) 144/84   Pulse 65   Temp 98 F (36.7 C)   Ht '5\' 4"'$  (1.626 m)   Wt 184 lb (83.5 kg)   SpO2 97%   BMI 31.58 kg/m    Physical Exam: Constitutional: Well appearing female, NAD Abdomen: Soft, non-tender, non-distended, no rebound/guarding Skin: Epigastric incision is well healed; no erythema or drainage   Assessment/Plan: This is a 47 y.o. female 4 weeks s/p epigastric hernia (3 cm ) repair and partial omentectomy with Dr Dahlia Byes     - Pain control prn  - Reviewed wound care recommendation  - Reviewed lifting restrictions; 6 weeks total (2 more weeks)  - She can follow up on as needed basis; She understands to call with questions/concerns  -- Edison Simon, PA-C [facility] 10/24/2022, 3:27 PM M-F: 7am - 4pm

## 2022-10-24 NOTE — Patient Instructions (Signed)

## 2022-11-12 ENCOUNTER — Other Ambulatory Visit: Payer: Self-pay

## 2022-11-12 ENCOUNTER — Ambulatory Visit: Payer: Medicaid Other | Admitting: Gastroenterology

## 2022-11-12 ENCOUNTER — Encounter: Payer: Self-pay | Admitting: Gastroenterology

## 2022-11-12 NOTE — Progress Notes (Deleted)
Jonathon Bellows MD, MRCP(U.K) [address]  [place]  [place], [place] [address]  Main: [phone]  Fax: [phone]   Primary Care Physician: Frazier Richards, MD  Primary Gastroenterologist:  Dr. Jonathon Bellows   No chief complaint on file.   HPI: Amber Campos is a 47 y.o. female   Summary of history :  Initially referred and seen on 08/19/2022 for lower abdominal pain.  She used to be seen by [facility] clinic gastroenterology back in 2020.  From last GI note she has a history of a cholecystectomy in 2020, internal hemorrhoids constipation and had been on Linzess 145 mcg a day.  In 2020 noted to have LA grade a esophagitis was being evaluated for dysphagia.  She had been on omeprazole 40 mg twice daily as per last GI note.She says that she been having right-sided pain after she eats lasting for a few hours for a few months.  Previously had constipation now having diarrhea multiple times a day.  No rectal bleeding.  Smokes marijuana.  Abdominal pain is crampy in nature.  No other relieving factors.  Has been taking Motrin on a regular basis multiple times a day.     06/12/2022 CT abdomen pelvis with contrast showed no acute abnormalities 08/18/2022: Urinalysis shows moderate leukocyte esterase, hemoglobin 13.6 g.  CMP normal.   She was seen at [facility] ER previously for abdominal pain, dysuria, recently completed a course of Keflex for UTI was treated as a UTI treatment failure she also had a CT scan of the abdomen and pelvis with contrast which showed no acute findings.  In 03/31/2022 also had a ER visit for flank pain.     Interval history  08/19/2022-11/12/2022   08/19/2022: Celiac , H pylori breath test , GI PCR stool , fecal calprotectin- normal/negative 08/21/2022: EGD: Normal , Colonoscopy : normal . Bx from colon ,duodenum , TI,stomach normal.  08/27/2022: HIDA and RUQ USG normal.  10/22/2022; [facility] visit with diagnosis of Rt lumbar radiculitis , given tylenol, meloxicam. 10/24/2022:  Underwent hernia repair with Dr Dahlia Byes   ***   Current Outpatient Medications  Medication Sig Dispense Refill   cetirizine (ZYRTEC) 10 MG tablet Take 10 mg by mouth daily.     cholecalciferol (VITAMIN D3) 25 MCG (1000 UNIT) tablet Take 1,000 Units by mouth daily.     clonazePAM (KLONOPIN) 1 MG tablet Take 1 mg by mouth 2 (two) times daily.      DULoxetine (CYMBALTA) 60 MG capsule Take 60 mg by mouth daily.     gabapentin (NEURONTIN) 300 MG capsule Take 300 mg by mouth daily.     gabapentin (NEURONTIN) 300 MG capsule Take 1 capsule (300 mg total) by mouth 3 (three) times daily. 30 capsule 0   Magnesium Gluconate 27.5 MG TABS Take 1 tablet by mouth daily.     meloxicam (MOBIC) 7.5 MG tablet Take 7.5 mg by mouth daily.     metoprolol succinate (TOPROL-XL) 100 MG 24 hr tablet Take 100 mg by mouth daily.     metoprolol tartrate (LOPRESSOR) 25 MG tablet Take 25 mg by mouth daily.     nitrofurantoin (MACRODANTIN) 100 MG capsule Take 100 mg by mouth 2 (two) times daily.     omeprazole (PRILOSEC OTC) 20 MG tablet Take 20 mg by mouth daily.     Rosuvastatin Calcium 10 MG CPSP daily.     Vitamin D, Ergocalciferol, (DRISDOL) 1.25 MG (50000 UNIT) CAPS capsule Take 50,000 Units by mouth every  7 (seven) days.     No current facility-administered medications for this visit.    Allergies as of 11/12/2022 - Review Complete 10/24/2022  Allergen Reaction Noted   Amoxicillin Hives, Itching, and Anaphylaxis 11/06/2016   Other Other (See Comments) 04/02/2015   Oxycodone-acetaminophen  05/15/2014    ROS:  General: Negative for anorexia, weight loss, fever, chills, fatigue, weakness. ENT: Negative for hoarseness, difficulty swallowing , nasal congestion. CV: Negative for chest pain, angina, palpitations, dyspnea on exertion, peripheral edema.  Respiratory: Negative for dyspnea at rest, dyspnea on exertion, cough, sputum, wheezing.  GI: See history of present illness. GU:  Negative for dysuria, hematuria,  urinary incontinence, urinary frequency, nocturnal urination.  Endo: Negative for unusual weight change.    Physical Examination:   There were no vitals taken for this visit.  General: Well-nourished, well-developed in no acute distress.  Eyes: No icterus. Conjunctivae pink. Mouth: Oropharyngeal mucosa moist and pink , no lesions erythema or exudate. Lungs: Clear to auscultation bilaterally. Non-labored. Heart: Regular rate and rhythm, no murmurs rubs or gallops.  Abdomen: Bowel sounds are normal, nontender, nondistended, no hepatosplenomegaly or masses, no abdominal bruits or hernia , no rebound or guarding.   Extremities: No lower extremity edema. No clubbing or deformities. Neuro: Alert and oriented x 3.  Grossly intact. Skin: Warm and dry, no jaundice.   Psych: Alert and cooperative, normal mood and affect.   Imaging Studies: No results found.  Assessment and Plan:   Amber Campos is a 47 y.o. y/o female  for right sided abdominal pain.  History suggestive of some elements of biliary colic she absolutely certain she has her gallbladder in place, history of NSAID use marijuana use.  Prior history of constipation presently has diarrhea.  Differentials for abdominal pain include biliary colic versus NSAID related inflammation versus effects of marijuana use.   Plan 1.  Increase dose of omeprazole from 40 mg once a day to twice a day due to prior history of esophagitis to treat any ulcers and inflammation 2.  Stop NSAID use.  Stop marijuana use.   Dr Jonathon Bellows  MD,MRCP [facility]) Follow up in ***  BP check ***

## 2023-01-21 ENCOUNTER — Encounter: Payer: Self-pay | Admitting: *Deleted

## 2023-01-21 ENCOUNTER — Emergency Department: Payer: Medicaid Other

## 2023-01-21 ENCOUNTER — Other Ambulatory Visit: Payer: Self-pay

## 2023-01-21 ENCOUNTER — Emergency Department
Admission: EM | Admit: 2023-01-21 | Discharge: 2023-01-21 | Disposition: A | Discharge status: Home / Self Care | Payer: Medicaid Other | Attending: Emergency Medicine | Admitting: Emergency Medicine

## 2023-01-21 DIAGNOSIS — S40812A Abrasion of left upper arm, initial encounter: Secondary | ICD-10-CM | POA: Diagnosis not present

## 2023-01-21 DIAGNOSIS — R1084 Generalized abdominal pain: Secondary | ICD-10-CM | POA: Insufficient documentation

## 2023-01-21 DIAGNOSIS — S39012A Strain of muscle, fascia and tendon of lower back, initial encounter: Secondary | ICD-10-CM | POA: Diagnosis not present

## 2023-01-21 DIAGNOSIS — M545 Low back pain, unspecified: Secondary | ICD-10-CM | POA: Diagnosis present

## 2023-01-21 DIAGNOSIS — Y92410 Unspecified street and highway as the place of occurrence of the external cause: Secondary | ICD-10-CM | POA: Diagnosis not present

## 2023-01-21 LAB — COMPREHENSIVE METABOLIC PANEL
ALT: 60 U/L — ABNORMAL HIGH (ref 0–44)
AST: 74 U/L — ABNORMAL HIGH (ref 15–41)
Albumin: 4.6 g/dL (ref 3.5–5.0)
Alkaline Phosphatase: 63 U/L (ref 38–126)
Anion gap: 11 (ref 5–15)
BUN: <5 mg/dL — ABNORMAL LOW (ref 6–20)
CO2: 24 mmol/L (ref 22–32)
Calcium: 9 mg/dL (ref 8.9–10.3)
Chloride: 102 mmol/L (ref 98–111)
Creatinine, Ser: 0.69 mg/dL (ref 0.44–1.00)
GFR, Estimated: >60 mL/min (ref >60)
Glucose, Bld: 101 mg/dL — ABNORMAL HIGH (ref 70–99)
Potassium: 3.6 mmol/L (ref 3.5–5.1)
Sodium: 137 mmol/L (ref 135–145)
Total Bilirubin: 1 mg/dL (ref 0.3–1.2)
Total Protein: 7.1 g/dL (ref 6.5–8.1)

## 2023-01-21 LAB — CBC
HCT: 39.3 % (ref 36.0–46.0)
Hemoglobin: 13.6 g/dL (ref 12.0–15.0)
MCH: 30.5 pg (ref 26.0–34.0)
MCHC: 34.6 g/dL (ref 30.0–36.0)
MCV: 88.1 fL (ref 80.0–100.0)
Platelets: 177 10*3/uL (ref 150–400)
RBC: 4.46 MIL/uL (ref 3.87–5.11)
RDW: 12 % (ref 11.5–15.5)
WBC: 8.7 10*3/uL (ref 4.0–10.5)
nRBC: 0 % (ref 0.0–0.2)

## 2023-01-21 LAB — URINALYSIS, ROUTINE W REFLEX MICROSCOPIC
Bilirubin Urine: NEGATIVE
Glucose, UA: NEGATIVE mg/dL
Hgb urine dipstick: NEGATIVE
Ketones, ur: NEGATIVE mg/dL
Leukocytes,Ua: NEGATIVE
Nitrite: NEGATIVE
Protein, ur: NEGATIVE mg/dL
Specific Gravity, Urine: 1.002 — ABNORMAL LOW (ref 1.005–1.030)
pH: 6 (ref 5.0–8.0)

## 2023-01-21 LAB — LIPASE, BLOOD: Lipase: 30 U/L (ref 11–51)

## 2023-01-21 MED ORDER — IOHEXOL 300 MG/ML  SOLN
100.0000 mL | Freq: Once | INTRAMUSCULAR | Status: AC | PRN
  Administered 2023-01-21: 100 mL via INTRAVENOUS
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
## 2023-02-10 ENCOUNTER — Other Ambulatory Visit: Payer: Self-pay

## 2023-02-10 ENCOUNTER — Encounter: Payer: Self-pay | Admitting: Emergency Medicine

## 2023-02-10 ENCOUNTER — Emergency Department
Admission: EM | Admit: 2023-02-10 | Discharge: 2023-02-11 | Disposition: A | Discharge status: Home / Self Care | Payer: Medicaid Other | Attending: Emergency Medicine | Admitting: Emergency Medicine

## 2023-02-10 DIAGNOSIS — Z87891 Personal history of nicotine dependence: Secondary | ICD-10-CM | POA: Insufficient documentation

## 2023-02-10 DIAGNOSIS — I10 Essential (primary) hypertension: Secondary | ICD-10-CM | POA: Insufficient documentation

## 2023-02-10 DIAGNOSIS — F419 Anxiety disorder, unspecified: Secondary | ICD-10-CM | POA: Insufficient documentation

## 2023-02-10 DIAGNOSIS — F1914 Other psychoactive substance abuse with psychoactive substance-induced mood disorder: Secondary | ICD-10-CM | POA: Insufficient documentation

## 2023-02-10 DIAGNOSIS — R45851 Suicidal ideations: Secondary | ICD-10-CM

## 2023-02-10 DIAGNOSIS — R45850 Homicidal ideations: Secondary | ICD-10-CM

## 2023-02-10 DIAGNOSIS — F1994 Other psychoactive substance use, unspecified with psychoactive substance-induced mood disorder: Secondary | ICD-10-CM

## 2023-02-10 DIAGNOSIS — F10129 Alcohol abuse with intoxication, unspecified: Secondary | ICD-10-CM | POA: Diagnosis not present

## 2023-02-10 DIAGNOSIS — F1910 Other psychoactive substance abuse, uncomplicated: Secondary | ICD-10-CM | POA: Diagnosis not present

## 2023-02-10 DIAGNOSIS — F1510 Other stimulant abuse, uncomplicated: Secondary | ICD-10-CM | POA: Insufficient documentation

## 2023-02-10 LAB — COMPREHENSIVE METABOLIC PANEL
ALT: 17 U/L (ref 0–44)
AST: 24 U/L (ref 15–41)
Albumin: 4.6 g/dL (ref 3.5–5.0)
Alkaline Phosphatase: 55 U/L (ref 38–126)
Anion gap: 9 (ref 5–15)
BUN: 10 mg/dL (ref 6–20)
CO2: 25 mmol/L (ref 22–32)
Calcium: 9.8 mg/dL (ref 8.9–10.3)
Chloride: 109 mmol/L (ref 98–111)
Creatinine, Ser: 0.72 mg/dL (ref 0.44–1.00)
GFR, Estimated: >60 mL/min (ref >60)
Glucose, Bld: 110 mg/dL — ABNORMAL HIGH (ref 70–99)
Potassium: 3.6 mmol/L (ref 3.5–5.1)
Sodium: 143 mmol/L (ref 135–145)
Total Bilirubin: 1.6 mg/dL — ABNORMAL HIGH (ref 0.3–1.2)
Total Protein: 7.2 g/dL (ref 6.5–8.1)

## 2023-02-10 LAB — ETHANOL: Alcohol, Ethyl (B): <10 mg/dL (ref <10)

## 2023-02-10 LAB — URINE DRUG SCREEN, QUALITATIVE (ARMC ONLY)
Amphetamines, Ur Screen: NOT DETECTED
Barbiturates, Ur Screen: NOT DETECTED
Benzodiazepine, Ur Scrn: POSITIVE — AB
Cannabinoid 50 Ng, Ur ~~LOC~~: POSITIVE — AB
Cocaine Metabolite,Ur ~~LOC~~: NOT DETECTED
MDMA (Ecstasy)Ur Screen: NOT DETECTED
Methadone Scn, Ur: NOT DETECTED
Opiate, Ur Screen: NOT DETECTED
Phencyclidine (PCP) Ur S: NOT DETECTED
Tricyclic, Ur Screen: NOT DETECTED

## 2023-02-10 LAB — ACETAMINOPHEN LEVEL: Acetaminophen (Tylenol), Serum: <10 ug/mL — ABNORMAL LOW (ref 10–30)

## 2023-02-10 LAB — CBC
HCT: 40.6 % (ref 36.0–46.0)
Hemoglobin: 13.8 g/dL (ref 12.0–15.0)
MCH: 30.3 pg (ref 26.0–34.0)
MCHC: 34 g/dL (ref 30.0–36.0)
MCV: 89.2 fL (ref 80.0–100.0)
Platelets: 182 10*3/uL (ref 150–400)
RBC: 4.55 MIL/uL (ref 3.87–5.11)
RDW: 12.7 % (ref 11.5–15.5)
WBC: 6.9 10*3/uL (ref 4.0–10.5)
nRBC: 0 % (ref 0.0–0.2)

## 2023-02-10 LAB — SALICYLATE LEVEL: Salicylate Lvl: <7 mg/dL — ABNORMAL LOW (ref 7.0–30.0)
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
## 2023-02-10 NOTE — ED Notes (Signed)
Patient belongings:  1 Hiedi Touchton tshirt 2 shoes 1 pink shorts 1 hair tie 1 Delvon Chipps bra 1 gray underwear   Patient noted to have 1 earring in each ear that patient and staff are unable to remove at this time.

## 2023-02-10 NOTE — ED Notes (Signed)
IVC PENDING  CONSULT ?
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
## 2023-02-10 NOTE — ED Notes (Signed)
Pt refused snack and dinner meal.

## 2023-02-10 NOTE — ED Notes (Signed)
Telepsych consult in progress

## 2023-02-10 NOTE — ED Notes (Signed)
Tts in with pt

## 2023-02-11 ENCOUNTER — Inpatient Hospital Stay
Admission: AD | Admit: 2023-02-11 | Discharge: 2023-02-13 | DRG: 897 | Disposition: A | Discharge status: Home / Self Care | Payer: Medicaid Other | Source: Ambulatory Visit | Attending: Psychiatry | Admitting: Psychiatry

## 2023-02-11 ENCOUNTER — Encounter: Payer: Self-pay | Admitting: Nurse Practitioner

## 2023-02-11 DIAGNOSIS — E785 Hyperlipidemia, unspecified: Secondary | ICD-10-CM | POA: Diagnosis present

## 2023-02-11 DIAGNOSIS — Z634 Disappearance and death of family member: Secondary | ICD-10-CM

## 2023-02-11 DIAGNOSIS — Z8249 Family history of ischemic heart disease and other diseases of the circulatory system: Secondary | ICD-10-CM | POA: Diagnosis not present

## 2023-02-11 DIAGNOSIS — F1994 Other psychoactive substance use, unspecified with psychoactive substance-induced mood disorder: Principal | ICD-10-CM | POA: Diagnosis present

## 2023-02-11 DIAGNOSIS — I10 Essential (primary) hypertension: Secondary | ICD-10-CM | POA: Diagnosis present

## 2023-02-11 DIAGNOSIS — F411 Generalized anxiety disorder: Secondary | ICD-10-CM | POA: Diagnosis present

## 2023-02-11 DIAGNOSIS — F17290 Nicotine dependence, other tobacco product, uncomplicated: Secondary | ICD-10-CM | POA: Diagnosis present

## 2023-02-11 DIAGNOSIS — K219 Gastro-esophageal reflux disease without esophagitis: Secondary | ICD-10-CM | POA: Diagnosis present

## 2023-02-11 DIAGNOSIS — R45851 Suicidal ideations: Secondary | ICD-10-CM | POA: Diagnosis present

## 2023-02-11 DIAGNOSIS — F419 Anxiety disorder, unspecified: Secondary | ICD-10-CM | POA: Diagnosis present

## 2023-02-11 MED ORDER — MAGNESIUM HYDROXIDE 400 MG/5ML PO SUSP: 30.0000 mL | Freq: Every day | ORAL | Status: DC | PRN

## 2023-02-11 MED ORDER — METOPROLOL SUCCINATE ER 25 MG PO TB24: 100.0000 mg | ORAL_TABLET | Freq: Every day | ORAL | Status: DC

## 2023-02-11 MED ORDER — LOPERAMIDE HCL 2 MG PO CAPS: 2.0000 mg | ORAL_CAPSULE | ORAL | Status: DC | PRN

## 2023-02-11 MED ORDER — ONDANSETRON 4 MG PO TBDP: 4.0000 mg | ORAL_TABLET | Freq: Four times a day (QID) | ORAL | Status: DC | PRN

## 2023-02-11 MED ORDER — LORAZEPAM 2 MG/ML IJ SOLN: 2.0000 mg | Freq: Three times a day (TID) | INTRAMUSCULAR | Status: DC | PRN

## 2023-02-11 MED ORDER — HYDROXYZINE HCL 25 MG PO TABS
25.0000 mg | ORAL_TABLET | Freq: Three times a day (TID) | ORAL | Status: DC | PRN
  Administered 2023-02-11: 25 mg via ORAL
  Filled 2023-02-11: qty 1

## 2023-02-11 MED ORDER — ROSUVASTATIN CALCIUM 10 MG PO TABS
10.0000 mg | ORAL_TABLET | Freq: Every day | ORAL | Status: DC
  Administered 2023-02-11 – 2023-02-12 (×2): 10 mg via ORAL
  Filled 2023-02-11 (×2): qty 1

## 2023-02-11 MED ORDER — GABAPENTIN 300 MG PO CAPS
300.0000 mg | ORAL_CAPSULE | Freq: Three times a day (TID) | ORAL | Status: DC
  Administered 2023-02-11 – 2023-02-13 (×6): 300 mg via ORAL
  Filled 2023-02-11 (×6): qty 1

## 2023-02-11 MED ORDER — METOPROLOL SUCCINATE ER 25 MG PO TB24
100.0000 mg | ORAL_TABLET | Freq: Every day | ORAL | Status: DC
  Administered 2023-02-12 – 2023-02-13 (×2): 100 mg via ORAL
  Filled 2023-02-11 (×3): qty 4

## 2023-02-11 MED ORDER — HALOPERIDOL LACTATE 5 MG/ML IJ SOLN: 5.0000 mg | Freq: Three times a day (TID) | INTRAMUSCULAR | Status: DC | PRN

## 2023-02-11 MED ORDER — ADULT MULTIVITAMIN W/MINERALS CH
1.0000 | ORAL_TABLET | Freq: Every day | ORAL | Status: DC
  Administered 2023-02-11 – 2023-02-12 (×2): 1 via ORAL
  Filled 2023-02-11 (×3): qty 1

## 2023-02-11 MED ORDER — HALOPERIDOL 5 MG PO TABS
5.0000 mg | ORAL_TABLET | Freq: Three times a day (TID) | ORAL | Status: DC | PRN
  Administered 2023-02-11: 5 mg via ORAL
  Filled 2023-02-11: qty 1

## 2023-02-11 MED ORDER — DULOXETINE HCL 60 MG PO CPEP
80.0000 mg | ORAL_CAPSULE | Freq: Every day | ORAL | Status: DC
  Administered 2023-02-11 – 2023-02-13 (×3): 80 mg via ORAL
  Filled 2023-02-11 (×3): qty 1

## 2023-02-11 MED ORDER — CHLORDIAZEPOXIDE HCL 25 MG PO CAPS: 25.0000 mg | ORAL_CAPSULE | Freq: Four times a day (QID) | ORAL | Status: DC | PRN

## 2023-02-11 MED ORDER — ALUM & MAG HYDROXIDE-SIMETH 200-200-20 MG/5ML PO SUSP: 30.0000 mL | ORAL | Status: DC | PRN

## 2023-02-11 MED ORDER — LORAZEPAM 2 MG PO TABS
2.0000 mg | ORAL_TABLET | Freq: Three times a day (TID) | ORAL | Status: DC | PRN
  Administered 2023-02-11: 2 mg via ORAL
  Filled 2023-02-11: qty 1

## 2023-02-11 MED ORDER — DULOXETINE HCL 30 MG PO CPEP: 60.0000 mg | ORAL_CAPSULE | Freq: Every day | ORAL | Status: DC

## 2023-02-11 MED ORDER — HYDROXYZINE HCL 25 MG PO TABS
25.0000 mg | ORAL_TABLET | Freq: Four times a day (QID) | ORAL | Status: DC | PRN
  Administered 2023-02-12 (×2): 25 mg via ORAL
  Filled 2023-02-11 (×2): qty 1

## 2023-02-11 MED ORDER — THIAMINE HCL 100 MG/ML IJ SOLN
100.0000 mg | Freq: Once | INTRAMUSCULAR | Status: DC
Start: 2023-02-11 — End: 2023-02-11

## 2023-02-11 MED ORDER — CLONAZEPAM 1 MG PO TABS
1.0000 mg | ORAL_TABLET | Freq: Two times a day (BID) | ORAL | Status: DC
  Administered 2023-02-11 – 2023-02-13 (×4): 1 mg via ORAL
  Filled 2023-02-11 (×4): qty 1

## 2023-02-11 NOTE — Tx Team (Signed)
Initial Treatment Plan 02/11/2023 5:51 AM [name] [id number]    PATIENT STRESSORS: Marital or family conflict   Substance abuse     PATIENT STRENGTHS: Ability for insight  Motivation for treatment/growth    PATIENT IDENTIFIED PROBLEMS: Depression  Anxiety  Suicidal Ideation                 DISCHARGE CRITERIA:  Improved stabilization in mood, thinking, and/or behavior Verbal commitment to aftercare and medication compliance  PRELIMINARY DISCHARGE PLAN: Outpatient therapy Return to previous living arrangement  PATIENT/FAMILY INVOLVEMENT: This treatment plan has been presented to and reviewed with the patient, [name].  The patient has been given the opportunity to ask questions and make suggestions.  Elmyra Ricks, RN 02/11/2023, 5:51 AM

## 2023-02-11 NOTE — Progress Notes (Signed)
D- Patient alert and oriented. Patient presented in an anxious, but pleasant mood on assessment stating that she slept "on and off, it was really rough, I'm tired". Patient had complaints of a slight headache, and stated that she takes scheduled medication to help. Patient denied depression, but endorsed anxiety stating "I have a lot of anxiety. My Daddy dies in 2020/02/21 and it's gotten worse, I haven't stopped crying since". Patient also denied SI, HI, AVH at this time. Patient had no stated goals for today.  A- Scheduled medications administered to patient, per MD orders. Support and encouragement provided. Routine safety checks conducted every 15 minutes. Patient informed to notify staff with problems or concerns.  R- No adverse drug reactions noted. Patient contracts for safety at this time. Patient compliant with medications and treatment plan. Patient receptive, calm, and cooperative. Patient isolates to room, except for meals and medication. Patient remains safe at this time.   02/11/23 0815  Psych Admission Type (Psych Patients Only)  Admission Status Involuntary  Psychosocial Assessment  Patient Complaints Crying spells;Anxiety;Worrying  Eye Contact Fair  Facial Expression Sad  Affect Anxious;Preoccupied  Speech Logical/coherent  Interaction Assertive  Motor Activity Slow  Appearance/Hygiene In scrubs  Behavior Characteristics Cooperative;Appropriate to situation  Mood Anxious;Pleasant  Aggressive Behavior  Effect No apparent injury  Thought Process  Coherency Circumstantial  Content Blaming others;Blaming self  Delusions None reported or observed  Perception WDL  Hallucination None reported or observed  Judgment Impaired  Confusion None  Danger to Self  Current suicidal ideation? Denies  Agreement Not to Harm Self Yes  Description of Agreement Verbal  Danger to Others  Danger to Others None reported or observed

## 2023-02-11 NOTE — Progress Notes (Signed)
   02/11/23 1802  CIWA-Ar  BP (!) 152/97  Pulse Rate 60  Nausea and Vomiting 0  Tactile Disturbances 0  Tremor 0  Auditory Disturbances 0  Paroxysmal Sweats 0  Visual Disturbances 0  Anxiety 3  Headache, Fullness in Head 0  Agitation 0  Orientation and Clouding of Sensorium 0  CIWA-Ar Total 3

## 2023-02-11 NOTE — Group Note (Signed)
Date:  02/11/2023 Time:  8:42 PM  Group Topic/Focus:  Wrap-Up Group:   The focus of this group is to help patients review their daily goal of treatment and discuss progress on daily workbooks.    Participation Level:  Active  Participation Quality:  Appropriate and Attentive  Affect:  Appropriate  Cognitive:  Alert  Insight: Appropriate  Engagement in Group:  Limited  Modes of Intervention:  Support  Additional Comments:     Maglione,Kellen Dutch E 02/11/2023, 8:42 PM
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
## 2023-02-11 NOTE — Group Note (Signed)
Recreation Therapy Group Note   Group Topic:Coping Skills  Group Date: 02/11/2023 Start Time: 1000 End Time: 1050 Facilitators: Tatiyana Foucher E, LRT, CTRS Location: Courtyard  Group Description: Mind Map.  Patient was provided a blank template of a diagram with 32 blank boxes in a tiered system, branching from the center (similar to a bubble chart). LRT directed patients to label the middle of the diagram "Coping Skills". LRT and patients then came up with 8 different coping skills as examples. Pt were directed to record their coping skills in the 2nd tier boxes closest to the center.  Patients would then share their coping skills with the group as LRT wrote them out. LRT gave a handout of 100 different coping skills at the end of group.   Goal Area(s) Addressed: Patients will be able to define "coping skills". Patient will identify new coping skills.  Patient will identify new possible leisure interests.    Affect/Mood: N/A   Participation Level: Did not attend    Clinical Observations/Individualized Feedback: Patient did not attend group.  Plan: Continue to engage patient in RT group sessions 2-3x/week.   Adolf Ormiston E Gurfateh Mcclain, LRT, CTRS 02/11/2023 11:04 AM 
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
## 2023-02-11 NOTE — Group Note (Signed)
Date:  02/11/2023 Time:  11:01 AM  Group Topic/Focus:  Activity Group:  The purpose of this activity group is to encourage the patients to come outside to the courtyard and get some fresh air and some exercise to contribute to their physical and mental wellbeing.    Participation Level:  Did Not Attend   Amber Campos 02/11/2023, 11:01 AM

## 2023-02-11 NOTE — Group Note (Signed)
Date:  02/11/2023 Time:  200 PM  Group Topic/Focus:  Healthy Communication:   The focus of this group is to discuss communication, barriers to communication, as well as healthy ways to communicate with others.  Community Group   Participation Level:  Did Not Attend  Makiah Foye A Anika Shore 02/11/2023, 5:07 PM
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
## 2023-02-11 NOTE — Plan of Care (Signed)

## 2023-02-11 NOTE — Plan of Care (Signed)

## 2023-02-11 NOTE — Progress Notes (Signed)
This Clinical research associate held scheduled Metoprolol due to HR being 54.   02/11/23 1112  Vital Signs  Pulse Rate (!) 54  BP (!) 132/95  BP Location Left Arm  BP Method Automatic  Patient Position (if appropriate) Standing

## 2023-02-12 NOTE — BH IP Treatment Plan (Signed)
Interdisciplinary Treatment and Diagnostic Plan Update  02/12/2023 Time of Session: 8:59 AM  Amber Campos MRN: [id number]  Principal Diagnosis: Substance induced mood disorder (HCC)  Secondary Diagnoses: Principal Problem:   Substance induced mood disorder (HCC)   Current Medications:  Current Facility-Administered Medications  Medication Dose Route Frequency Provider Last Rate Last Admin   alum & mag hydroxide-simeth (MAALOX/MYLANTA) 200-200-20 MG/5ML suspension 30 mL  30 mL Oral Q4H PRN Onuoha, Chinwendu V, NP       chlordiazePOXIDE (LIBRIUM) capsule 25 mg  25 mg Oral Q6H PRN Lauree Chandler, NP       clonazePAM Scarlette Calico) tablet 1 mg  1 mg Oral BID Lauree Chandler, NP   1 mg at 02/12/23 0803   DULoxetine (CYMBALTA) DR capsule 80 mg  80 mg Oral Daily Lauree Chandler, NP   80 mg at 02/12/23 0804   gabapentin (NEURONTIN) capsule 300 mg  300 mg Oral TID Lauree Chandler, NP   300 mg at 02/12/23 4098   haloperidol (HALDOL) tablet 5 mg  5 mg Oral TID PRN Onuoha, Chinwendu V, NP   5 mg at 02/11/23 1114   Or   haloperidol lactate (HALDOL) injection 5 mg  5 mg Intramuscular TID PRN Onuoha, Chinwendu V, NP       hydrOXYzine (ATARAX) tablet 25 mg  25 mg Oral Q6H PRN Lauree Chandler, NP       loperamide (IMODIUM) capsule 2-4 mg  2-4 mg Oral PRN Lauree Chandler, NP       LORazepam (ATIVAN) tablet 2 mg  2 mg Oral TID PRN Onuoha, Chinwendu V, NP   2 mg at 02/11/23 1114   Or   LORazepam (ATIVAN) injection 2 mg  2 mg Intramuscular TID PRN Onuoha, Chinwendu V, NP       magnesium hydroxide (MILK OF MAGNESIA) suspension 30 mL  30 mL Oral Daily PRN Onuoha, Chinwendu V, NP       metoprolol succinate (TOPROL-XL) 24 hr tablet 100 mg  100 mg Oral Daily Lauree Chandler, NP   100 mg at 02/12/23 1191   multivitamin with minerals tablet 1 tablet  1 tablet Oral Daily Lauree Chandler, NP   1 tablet at 02/12/23 0803   ondansetron (ZOFRAN-ODT) disintegrating tablet 4 mg  4 mg Oral  Q6H PRN Lauree Chandler, NP       rosuvastatin (CRESTOR) tablet 10 mg  10 mg Oral QHS Lauree Chandler, NP   10 mg at 02/11/23 2206   PTA Medications: Medications Prior to Admission  Medication Sig Dispense Refill Last Dose   cetirizine (ZYRTEC) 10 MG tablet Take 10 mg by mouth daily.      cholecalciferol (VITAMIN D3) 25 MCG (1000 UNIT) tablet Take 1,000 Units by mouth daily.      clonazePAM (KLONOPIN) 1 MG tablet Take 1 mg by mouth 2 (two) times daily.       DULoxetine (CYMBALTA) 60 MG capsule Take 60 mg by mouth daily.      gabapentin (NEURONTIN) 300 MG capsule Take 1 capsule (300 mg total) by mouth 3 (three) times daily. 30 capsule 0    Magnesium Gluconate 27.5 MG TABS Take 1 tablet by mouth daily.      meloxicam (MOBIC) 15 MG tablet Take 15 mg by mouth daily as needed for pain.      metoprolol succinate (TOPROL-XL) 100 MG 24 hr tablet Take 100 mg by mouth daily.      nitrofurantoin (MACRODANTIN)  100 MG capsule Take 100 mg by mouth 2 (two) times daily. (Patient not taking: Reported on 02/10/2023)      omeprazole (PRILOSEC) 40 MG capsule Take 40 mg by mouth 2 (two) times daily.      rosuvastatin (CRESTOR) 10 MG tablet Take 10 mg by mouth at bedtime.      Vitamin D, Ergocalciferol, (DRISDOL) 1.25 MG (50000 UNIT) CAPS capsule Take 50,000 Units by mouth every 7 (seven) days. (Patient not taking: Reported on 02/10/2023)       Patient Stressors: Marital or family conflict   Substance abuse    Patient Strengths: Ability for insight  Motivation for treatment/growth   Treatment Modalities: Medication Management, Group therapy, Case management,  1 to 1 session with clinician, Psychoeducation, Recreational therapy.   Physician Treatment Plan for Primary Diagnosis: Substance induced mood disorder (HCC) Long Term Goal(s): Improvement in symptoms so as ready for discharge   Short Term Goals: Ability to verbalize feelings will improve Ability to identify and develop effective coping  behaviors will improve Ability to identify triggers associated with substance abuse/mental health issues will improve  Medication Management: Evaluate patient's response, side effects, and tolerance of medication regimen.  Therapeutic Interventions: 1 to 1 sessions, Unit Group sessions and Medication administration.  Evaluation of Outcomes: Not Met  Physician Treatment Plan for Secondary Diagnosis: Principal Problem:   Substance induced mood disorder (HCC)  Long Term Goal(s): Improvement in symptoms so as ready for discharge   Short Term Goals: Ability to verbalize feelings will improve Ability to identify and develop effective coping behaviors will improve Ability to identify triggers associated with substance abuse/mental health issues will improve     Medication Management: Evaluate patient's response, side effects, and tolerance of medication regimen.  Therapeutic Interventions: 1 to 1 sessions, Unit Group sessions and Medication administration.  Evaluation of Outcomes: Not Met   RN Treatment Plan for Primary Diagnosis: Substance induced mood disorder (HCC) Long Term Goal(s): Knowledge of disease and therapeutic regimen to maintain health will improve  Short Term Goals: Ability to remain free from injury will improve, Ability to verbalize frustration and anger appropriately will improve, Ability to demonstrate self-control, Ability to participate in decision making will improve, Ability to verbalize feelings will improve, Ability to disclose and discuss suicidal ideas, Ability to identify and develop effective coping behaviors will improve, and Compliance with prescribed medications will improve  Medication Management: RN will administer medications as ordered by provider, will assess and evaluate patient's response and provide education to patient for prescribed medication. RN will report any adverse and/or side effects to prescribing provider.  Therapeutic Interventions: 1 on 1  counseling sessions, Psychoeducation, Medication administration, Evaluate responses to treatment, Monitor vital signs and CBGs as ordered, Perform/monitor CIWA, COWS, AIMS and Fall Risk screenings as ordered, Perform wound care treatments as ordered.  Evaluation of Outcomes: Not Met   LCSW Treatment Plan for Primary Diagnosis: Substance induced mood disorder (HCC) Long Term Goal(s): Safe transition to appropriate next level of care at discharge, Engage patient in therapeutic group addressing interpersonal concerns.  Short Term Goals: Engage patient in aftercare planning with referrals and resources, Increase social support, Increase ability to appropriately verbalize feelings, Increase emotional regulation, Facilitate acceptance of mental health diagnosis and concerns, Facilitate patient progression through stages of change regarding substance use diagnoses and concerns, and Increase skills for wellness and recovery  Therapeutic Interventions: Assess for all discharge needs, 1 to 1 time with Social worker, Explore available resources and support systems, Assess for adequacy  in community support network, Educate family and significant other(s) on suicide prevention, Complete Psychosocial Assessment, Interpersonal group therapy.  Evaluation of Outcomes: Not Met   Progress in Treatment: Attending groups: Yes. and No. Participating in groups: Yes. and No. Taking medication as prescribed: Yes. Toleration medication: Yes. Family/Significant other contact made: No, will contact:  will contact if given permission  Patient understands diagnosis: Yes. Discussing patient identified problems/goals with staff: Yes. Medical problems stabilized or resolved: Yes. Denies suicidal/homicidal ideation: Yes. Issues/concerns per patient self-inventory: No. Other: None   New problem(s) identified: No, Describe:  None identified   New Short Term/Long Term Goal(s): elimination of symptoms of psychosis,  medication management for mood stabilization; elimination of SI thoughts; development of comprehensive mental wellness/sobriety plan.   Patient Goals:  "Just to get the best help I can and to get better all around"   Discharge Plan or Barriers: CSW will assist with appropriate discharge planning   Reason for Continuation of Hospitalization: Depression Medication stabilization Suicidal ideation  Estimated Length of Stay: 1 to 7 days   Last 3 [place] Suicide Severity Risk Score: Flowsheet Row Admission (Current) from 02/11/2023 in [facility] ED from 02/10/2023 in [facility] Emergency Department at [facility] ED from 01/21/2023 in [facility] Emergency Department at [facility]  C-SSRS RISK CATEGORY Low Risk No Risk No Risk       Last PHQ 2/9 Scores:     No data to display          Scribe for Treatment Team: Elza Rafter, Theresia Majors 02/12/2023 11:32 AM

## 2023-02-12 NOTE — Group Note (Signed)
Recreation Therapy Group Note   Group Topic:Relaxation  Group Date: 02/12/2023 Start Time: 1000 End Time: 1050 Facilitators: Rosina Lowenstein, LRT, CTRS Location:  Craft room   Group Description: PMR (Progressive Muscle Relaxation). LRT asks patients their current level of stress/anxiety from 1-10, with 10 being the highest. LRT educates patients on what PMR is and the benefits that come from it. Patients are asked to sit with their feet flat on the floor while sitting up and all the way back in their chair, if possible. LRT and pts follow a prompt through a speaker that requires you to tense and release different muscles in their body and focus on their breathing. During session, lights are off and soft music is being played. At the end of the prompt, LRT asks patients to rank their current levels of stress/anxiety from 1-10, 10 being the highest.   Goal Area(s) Addressed:  Patients will be able to describe progressive muscle relaxation.  Patient will practice using relaxation technique. Patient will identify a new coping skill.  Patient will follow multistep directions to reduce anxiety and stress.  Affect/Mood: Appropriate   Participation Level: Active and Engaged   Participation Quality: Independent   Behavior: Calm and Cooperative   Speech/Thought Process: Coherent   Insight: Good   Judgement: Good   Modes of Intervention: Activity   Patient Response to Interventions:  Attentive, Engaged, Interested , and Receptive   Education Outcome:  Acknowledges education   Clinical Observations/Individualized Feedback: Amber Campos was active in their participation of session activities and group discussion. Pt identified that her anxiety and stress were a 4 before the session. Afterwards, she rated her anxiety and stress a 0. Pt shared "Very relaxing. I am going to try when I get out" in the comment section on the paper. Pt interacted well with LRT and peers duration of session.   Plan:  Continue to engage patient in RT group sessions 2-3x/week.   Rosina Lowenstein, LRT, CTRS 02/12/2023 11:05 AM

## 2023-02-12 NOTE — Progress Notes (Addendum)
[facility] MD Progress Note  02/12/2023 3:39 PM Amber Campos  MRN:  [id number] Subjective:  Amber Campos is seen on rounds today. She is seen at bedside. Pt reports euthymic mood today, reports feeling "so much better". Reports attended group earlier today on relaxation and felt it was helpful for her. Denies SI/HI, AVH, paranoia. Denies side effects from increased duloxetine. Discussed with pt whether she has had the opportunity to speak with family since admission. She is tearful and states she spoke with her husband, son and daughter yesterday. States her husband and daughter went to court today to drop charges against her for simple assault and misdemeanor. She is hopeful for discharge tomorrow. States she is eager to go home and is willing to follow up with outpatient counseling and psychiatry.   Pt gave verbal consent to have her husband, Amber Campos, called. Provides Leroy's phone number as [phone]. Amber Campos was called during assessment with pt present. Amber Campos confirms he just got out of court. He agrees with plan for discharge tomorrow. Safety planning completed including: Frequent conversations regarding unsafe thoughts. Locking/monitoring the use of all significant sharps, including knives, razor blades, pencil sharpener razors. Emphasized the importance of keeping the firearm in the home unloaded, locking the firearm, locking the ammunition separately from the firearm, preventing access to the firearm and the ammunition. Locking/monitoring the use of medications, including over-the-counter medications and supplements. Having a responsible person dispense medications until patient has strengthened coping skills. Room checks for sharps or other harmful objects. Secure all chemical substances that can be ingested or inhaled. Securing any ligature risks. Calling 911/EMS or going to the nearest emergency room for any worsening of condition. Amber Campos agrees to Aeronautical engineer. He states he can pick pt up from the hospital  tomorrow.   Principal Problem: Substance induced mood disorder (HCC) Diagnosis: Principal Problem:   Substance induced mood disorder (HCC)  Total Time spent with patient: 30 minutes  Past Psychiatric History: Reported history of anxiety, depression  Past Medical History:  Past Medical History:  Diagnosis Date   Anemia    Anxiety    Chronic constipation    Colon polyp 09/2014   Depression    Dyspareunia    Dysphagia    Family history of adverse reaction to anesthesia    mother and aunt has hard time waking up   GERD (gastroesophageal reflux disease)    History of chickenpox    History of kidney stones    History of recurrent UTI (urinary tract infection)    HLD (hyperlipidemia)    HTN (hypertension)    Hypogammaglobulinemia (HCC)    Migraine    Perimenopausal atrophic vaginitis    Pneumonia    Urinary frequency     Past Surgical History:  Procedure Laterality Date   ABDOMINAL HYSTERECTOMY  2015   Complete   ABDOMINAL HYSTERECTOMY     APPENDECTOMY     CESAREAN SECTION     COLONOSCOPY  09/2014   polyp removed   COLONOSCOPY WITH PROPOFOL N/A 08/21/2022   Procedure: COLONOSCOPY WITH PROPOFOL;  Surgeon: Wyline Mood, MD;  Location: [facility] ENDOSCOPY;  Service: Gastroenterology;  Laterality: N/A;   ENDOMETRIAL ABLATION     EPIGASTRIC HERNIA REPAIR N/A 09/26/2022   Procedure: HERNIA REPAIR EPIGASTRIC ADULT, open;  Surgeon: Leafy Ro, MD;  Location: [facility] ORS;  Service: General;  Laterality: N/A;   ESOPHAGOGASTRODUODENOSCOPY  09/2014   ESOPHAGOGASTRODUODENOSCOPY N/A 08/21/2022   Procedure: ESOPHAGOGASTRODUODENOSCOPY (EGD);  Surgeon: Wyline Mood, MD;  Location: [facility] ENDOSCOPY;  Service: Gastroenterology;  Laterality: N/A;   INSERTION OF MESH N/A 09/26/2022   Procedure: INSERTION OF MESH;  Surgeon: Leafy Ro, MD;  Location: [facility] ORS;  Service: General;  Laterality: N/A;   KNEE ARTHROSCOPY Bilateral 2011, 2015   OOPHORECTOMY     TONSILLECTOMY AND ADENOIDECTOMY     Family  History:  Family History  Problem Relation Age of Onset   Hypertension Mother    Hyperlipidemia Mother    Diabetes Mother    Nephrolithiasis Mother        Father   Stroke Mother    Heart disease Mother    Colon polyps Mother    Hypertension Father    Hyperlipidemia Father    Transient ischemic attack Father    Prostate cancer Paternal Grandfather    Deep vein thrombosis Other    Breast cancer Maternal Aunt    Family Psychiatric  History: None reported Social History:  Social History   Substance and Sexual Activity  Alcohol Use Not Currently   Comment: occasional wine an dliquor     Social History   Substance and Sexual Activity  Drug Use No    Social History   Socioeconomic History   Marital status: Married    Spouse name: Amber Campos   Number of children: 3   Years of education: Not on file   Highest education level: Not on file  Occupational History   Not on file  Tobacco Use   Smoking status: Former    Packs/day: 0.25    Years: 20.00    Additional pack years: 0.00    Total pack years: 5.00    Types: Cigarettes    Passive exposure: Past   Smokeless tobacco: Never  Vaping Use   Vaping Use: Every day   Substances: Nicotine, THC, Flavoring  Substance and Sexual Activity   Alcohol use: Not Currently    Comment: occasional wine an dliquor   Drug use: No   Sexual activity: Yes  Other Topics Concern   Not on file  Social History Narrative   Married to [place], has 3 children   Right handed   Some college   Drinks caffeine free   Social Determinants of Health   Financial Resource Strain: Not on file  Food Insecurity: No Food Insecurity (02/11/2023)   Hunger Vital Sign    Worried About Running Out of Food in the Last Year: Never true    Ran Out of Food in the Last Year: Never true  Transportation Needs: No Transportation Needs (02/11/2023)   PRAPARE - Administrator, Civil Service (Medical): No    Lack of Transportation (Non-Medical): No  Physical  Activity: Not on file  Stress: Not on file  Social Connections: Not on file   Additional Social History:                         Sleep: Good  Appetite:  Good  Current Medications: Current Facility-Administered Medications  Medication Dose Route Frequency Provider Last Rate Last Admin   alum & mag hydroxide-simeth (MAALOX/MYLANTA) 200-200-20 MG/5ML suspension 30 mL  30 mL Oral Q4H PRN Onuoha, Chinwendu V, NP       chlordiazePOXIDE (LIBRIUM) capsule 25 mg  25 mg Oral Q6H PRN Lauree Chandler, NP       clonazePAM Scarlette Calico) tablet 1 mg  1 mg Oral BID Lauree Chandler, NP   1 mg at 02/12/23 0803   DULoxetine (CYMBALTA) DR capsule 80 mg  80  mg Oral Daily Lauree Chandler, NP   80 mg at 02/12/23 1610   gabapentin (NEURONTIN) capsule 300 mg  300 mg Oral TID Lauree Chandler, NP   300 mg at 02/12/23 1255   haloperidol (HALDOL) tablet 5 mg  5 mg Oral TID PRN Onuoha, Chinwendu V, NP   5 mg at 02/11/23 1114   Or   haloperidol lactate (HALDOL) injection 5 mg  5 mg Intramuscular TID PRN Onuoha, Chinwendu V, NP       hydrOXYzine (ATARAX) tablet 25 mg  25 mg Oral Q6H PRN Lauree Chandler, NP       loperamide (IMODIUM) capsule 2-4 mg  2-4 mg Oral PRN Lauree Chandler, NP       LORazepam (ATIVAN) tablet 2 mg  2 mg Oral TID PRN Onuoha, Chinwendu V, NP   2 mg at 02/11/23 1114   Or   LORazepam (ATIVAN) injection 2 mg  2 mg Intramuscular TID PRN Onuoha, Chinwendu V, NP       magnesium hydroxide (MILK OF MAGNESIA) suspension 30 mL  30 mL Oral Daily PRN Onuoha, Chinwendu V, NP       metoprolol succinate (TOPROL-XL) 24 hr tablet 100 mg  100 mg Oral Daily Lauree Chandler, NP   100 mg at 02/12/23 9604   multivitamin with minerals tablet 1 tablet  1 tablet Oral Daily Lauree Chandler, NP   1 tablet at 02/12/23 0803   ondansetron (ZOFRAN-ODT) disintegrating tablet 4 mg  4 mg Oral Q6H PRN Lauree Chandler, NP       rosuvastatin (CRESTOR) tablet 10 mg  10 mg Oral QHS Lauree Chandler, NP   10 mg at 02/11/23 2206    Lab Results:  Results for orders placed or performed during the hospital encounter of 02/10/23 (from the past 48 hour(s))  Comprehensive metabolic panel     Status: Abnormal   Collection Time: 02/10/23  5:19 PM  Result Value Ref Range   Sodium 143 135 - 145 mmol/L   Potassium 3.6 3.5 - 5.1 mmol/L   Chloride 109 98 - 111 mmol/L   CO2 25 22 - 32 mmol/L   Glucose, Bld 110 (H) 70 - 99 mg/dL    Comment: Glucose reference range applies only to samples taken after fasting for at least 8 hours.   BUN 10 6 - 20 mg/dL   Creatinine, Ser 5.40 0.44 - 1.00 mg/dL   Calcium 9.8 8.9 - 98.1 mg/dL   Total Protein 7.2 6.5 - 8.1 g/dL   Albumin 4.6 3.5 - 5.0 g/dL   AST 24 15 - 41 U/L   ALT 17 0 - 44 U/L   Alkaline Phosphatase 55 38 - 126 U/L   Total Bilirubin 1.6 (H) 0.3 - 1.2 mg/dL   GFR, Estimated >19 >14 mL/min    Comment: (NOTE) Calculated using the CKD-EPI Creatinine Equation (2021)    Anion gap 9 5 - 15    Comment: Performed at [facility], [address] [place], [place] [address]  Salicylate level     Status: Abnormal   Collection Time: 02/10/23  5:19 PM  Result Value Ref Range   Salicylate Lvl <7.0 (L) 7.0 - 30.0 mg/dL    Comment: Performed at [facility], [address] [place], [place] [address]  Acetaminophen level     Status: Abnormal   Collection Time: 02/10/23  5:19 PM  Result Value Ref Range   Acetaminophen (Tylenol), Serum <10 (L)  10 - 30 ug/mL    Comment: (NOTE) Therapeutic concentrations vary significantly. A range of 10-30 ug/mL  may be an effective concentration for many patients. However, some  are best treated at concentrations outside of this range. Acetaminophen concentrations >150 ug/mL at 4 hours after ingestion  and >50 ug/mL at 12 hours after ingestion are often associated with  toxic reactions.  Performed at [facility], [address] [place], [place] [address]   cbc      Status: None   Collection Time: 02/10/23  5:19 PM  Result Value Ref Range   WBC 6.9 4.0 - 10.5 K/uL   RBC 4.55 3.87 - 5.11 MIL/uL   Hemoglobin 13.8 12.0 - 15.0 g/dL   HCT 40.9 81.1 - 91.4 %   MCV 89.2 80.0 - 100.0 fL   MCH 30.3 26.0 - 34.0 pg   MCHC 34.0 30.0 - 36.0 g/dL   RDW 78.2 95.6 - 21.3 %   Platelets 182 150 - 400 K/uL   nRBC 0.0 0.0 - 0.2 %    Comment: Performed at [facility], [address], [place], [place] [address]  Urine Drug Screen, Qualitative     Status: Abnormal   Collection Time: 02/10/23  5:19 PM  Result Value Ref Range   Tricyclic, Ur Screen NONE DETECTED NONE DETECTED   Amphetamines, Ur Screen NONE DETECTED NONE DETECTED   MDMA (Ecstasy)Ur Screen NONE DETECTED NONE DETECTED   Cocaine Metabolite,Ur Opal NONE DETECTED NONE DETECTED   Opiate, Ur Screen NONE DETECTED NONE DETECTED   Phencyclidine (PCP) Ur S NONE DETECTED NONE DETECTED   Cannabinoid 50 Ng, Ur [place] POSITIVE (A) NONE DETECTED   Barbiturates, Ur Screen NONE DETECTED NONE DETECTED   Benzodiazepine, Ur Scrn POSITIVE (A) NONE DETECTED   Methadone Scn, Ur NONE DETECTED NONE DETECTED    Comment: (NOTE) Tricyclics + metabolites, urine    Cutoff 1000 ng/mL Amphetamines + metabolites, urine  Cutoff 1000 ng/mL MDMA (Ecstasy), urine              Cutoff 500 ng/mL Cocaine Metabolite, urine          Cutoff 300 ng/mL Opiate + metabolites, urine        Cutoff 300 ng/mL Phencyclidine (PCP), urine         Cutoff 25 ng/mL Cannabinoid, urine                 Cutoff 50 ng/mL Barbiturates + metabolites, urine  Cutoff 200 ng/mL Benzodiazepine, urine              Cutoff 200 ng/mL Methadone, urine                   Cutoff 300 ng/mL  The urine drug screen provides only a preliminary, unconfirmed analytical test result and should not be used for non-medical purposes. Clinical consideration and professional judgment should be applied to any positive drug screen result due to possible interfering substances. A more  specific alternate chemical method must be used in order to obtain a confirmed analytical result. Gas chromatography / mass spectrometry (GC/MS) is the preferred confirm atory method. Performed at [facility], [address] [place], [place] [address]   Ethanol     Status: None   Collection Time: 02/10/23  5:43 PM  Result Value Ref Range   Alcohol, Ethyl (B) <10 <10 mg/dL    Comment: (NOTE) Lowest detectable limit for serum alcohol is 10 mg/dL.  For medical purposes only. Performed  at [facility], [address] [place], [place] [address]     Blood Alcohol level:  Lab Results  Component Value Date   [facility] <10 02/10/2023   ETH <10 01/26/2022    Metabolic Disorder Labs: No results found for: "HGBA1C", "MPG" No results found for: "PROLACTIN" No results found for: "CHOL", "TRIG", "HDL", "CHOLHDL", "VLDL", "LDLCALC"  Physical Findings: AIMS:  , ,  ,  ,    CIWA:  CIWA-Ar Total: 2 COWS:     Musculoskeletal: Strength & Muscle Tone: within normal limits Gait & Station: normal Patient leans: N/A  Psychiatric Specialty Exam:  Presentation  General Appearance:  Appropriate for Environment  Eye Contact: Fair  Speech: Clear and Coherent; Normal Rate  Speech Volume: Normal  Handedness: Right   Mood and Affect  Mood: Euthymic  Affect: Blunt; Tearful   Thought Process  Thought Processes: Coherent; Goal Directed; Linear  Descriptions of Associations:Intact  Orientation:Full (Time, Place and Person)  Thought Content:Logical  History of Schizophrenia/Schizoaffective disorder:No  Duration of Psychotic Symptoms:No data recorded Hallucinations:Hallucinations: None  Ideas of Reference:None  Suicidal Thoughts:Suicidal Thoughts: No  Homicidal Thoughts:Homicidal Thoughts: No   Sensorium  Memory: Immediate Fair  Judgment: Intact  Insight: Present   Executive Functions  Concentration: Good  Attention  Span: Good  Recall: Good  Fund of Knowledge: Good  Language: Good   Psychomotor Activity  Psychomotor Activity: Psychomotor Activity: Normal   Assets  Assets: Communication Skills; Desire for Improvement   Sleep  Sleep: Sleep: Fair    Physical Exam: Physical Exam Constitutional:      General: She is not in acute distress.    Appearance: She is not ill-appearing, toxic-appearing or diaphoretic.  Eyes:     General: No scleral icterus. Cardiovascular:     Rate and Rhythm: Bradycardia present.  Pulmonary:     Effort: Pulmonary effort is normal. No respiratory distress.  Neurological:     Mental Status: She is alert and oriented to person, place, and time.  Psychiatric:        Attention and Perception: Attention and perception normal.        Mood and Affect: Mood normal. Affect is blunt and tearful.        Speech: Speech normal.        Behavior: Behavior normal. Behavior is cooperative.        Thought Content: Thought content normal.        Cognition and Memory: Cognition and memory normal.        Judgment: Judgment normal.    Review of Systems  Constitutional:  Negative for chills and fever.  Respiratory:  Negative for shortness of breath.   Cardiovascular:  Negative for chest pain and palpitations.  Gastrointestinal:  Negative for abdominal pain.  Psychiatric/Behavioral: Negative.     Blood pressure 108/68, pulse (!) 52, temperature 98.1 F (36.7 C), temperature source Oral, resp. rate (!) 21, height 5\' 4"  (1.626 m), weight 73.9 kg, SpO2 98 %. Body mass index is 27.98 kg/m.   Treatment Plan Summary: Daily contact with patient to assess and evaluate symptoms and progress in treatment, Medication management, and Plan continue current medications, planning for discharge tomorrow  Lauree Chandler, NP 02/12/2023, 3:39 PM
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
## 2023-02-12 NOTE — Group Note (Signed)
Date:  02/12/2023 Time:  5:21 PM  Group Topic/Focus:  Activity/Outdoor Recreation    Participation Level:  Active  Participation Quality:  Appropriate  Affect:  Appropriate  Cognitive:  Appropriate  Insight: Appropriate  Engagement in Group:  Engaged  Modes of Intervention:  Activity  Additional Comments:    Wilford Corner 02/12/2023, 5:21 PM

## 2023-02-12 NOTE — Progress Notes (Signed)
Suicide Risk Assessment  Discharge Assessment    [facility] Discharge Suicide Risk Assessment   Principal Problem: Substance induced mood disorder (HCC) Discharge Diagnoses: Principal Problem:   Substance induced mood disorder (HCC)   Total Time spent with patient: {Time; 15 min - 8 hours:17441}  Musculoskeletal: Strength & Muscle Tone: within normal limits Gait & Station: normal Patient leans: N/A  Psychiatric Specialty Exam  Presentation  General Appearance:  Appropriate for Environment  Eye Contact: Fair  Speech: Clear and Coherent; Normal Rate  Speech Volume: Normal  Handedness: Right   Mood and Affect  Mood: Euthymic  Duration of Depression Symptoms: Greater than two weeks  Affect: Blunt; Tearful   Thought Process  Thought Processes: Coherent; Goal Directed; Linear  Descriptions of Associations:Intact  Orientation:Full (Time, Place and Person)  Thought Content:Logical  History of Schizophrenia/Schizoaffective disorder:No  Duration of Psychotic Symptoms:No data recorded Hallucinations:Hallucinations: None  Ideas of Reference:None  Suicidal Thoughts:Suicidal Thoughts: No  Homicidal Thoughts:Homicidal Thoughts: No   Sensorium  Memory: Immediate Fair  Judgment: Intact  Insight: Present   Executive Functions  Concentration: Good  Attention Span: Good  Recall: Good  Fund of Knowledge: Good  Language: Good   Psychomotor Activity  Psychomotor Activity: Psychomotor Activity: Normal   Assets  Assets: Communication Skills; Desire for Improvement   Sleep  Sleep: Sleep: Fair   Physical Exam: Physical Exam ROS Blood pressure 108/68, pulse (!) 52, temperature 98.1 F (36.7 C), temperature source Oral, resp. rate (!) 21, height 5\' 4"  (1.626 m), weight 73.9 kg, SpO2 98 %. Body mass index is 27.98 kg/m.  Mental Status Per Nursing Assessment::   On Admission:  Suicidal ideation indicated by patient  Demographic  Factors:  Caucasian  Loss Factors: Loss of significant relationship and Legal issues  Historical Factors: NA  Risk Reduction Factors:   Sense of responsibility to family, Employed, Living with another person, especially a relative, and Positive social support  Continued Clinical Symptoms:  Previous Psychiatric Diagnoses and Treatments  Cognitive Features That Contribute To Risk:  None    Suicide Risk:  Minimal: No identifiable suicidal ideation.  Patients presenting with no risk factors but with morbid ruminations; may be classified as minimal risk based on the severity of the depressive symptoms   Follow-up Information     [facility] Go in 7 day(s).   Why: Go to [facility] on 02/19/23 at 7:30 AM per Lorella Nimrod. Please bring your insurance card to your visit. Contact information: [address] [place] [address] [phone]                 Plan Of Care/Follow-up recommendations:  Other:  Pt is to follow up with [facility] on 02/19/23 for outpatient behavioral health services  Lauree Chandler, NP 02/12/2023, 3:55 PM

## 2023-02-12 NOTE — Group Note (Signed)
Date:  02/12/2023 Time:  10:57 AM  Group Topic/Focus:  Goals Group:   The focus of this group is to help patients establish daily goals to achieve during treatment and discuss how the patient can incorporate goal setting into their daily lives to aide in recovery.    Participation Level:  Active  Participation Quality:  Appropriate  Affect:  Appropriate  Cognitive:  Appropriate  Insight: Appropriate  Engagement in Group:  Engaged  Modes of Intervention:  Discussion, Education, and Support  Additional Comments:    Wilford Corner 02/12/2023, 10:57 AM

## 2023-02-12 NOTE — Progress Notes (Signed)
Patient alert & oriented x 4.  She is able to voices complaint and/or concerns appropriately.  She spent the majority of the shift in her room resting.  She denied SI, HI, delusions, and hallucinations.    Medications given without difficulty.  No adverse reactions reported or observed.    No s/sx of acute distress noted.  Respiration even & unlabored.  Q70min safety checks completed as per protocol.  Plan of Care followed as it is written.
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
## 2023-02-12 NOTE — Progress Notes (Signed)
D: Patient alert and oriented, able to make needs known. Denies SI/HI, AVH at present. Denies pain at present. Patient goal today "to get all the help I can get." Rates depression 5/10, hopelessness 5/10, and anxiety 7/10. Patient reports energy level as normal. She reports she slept fair last night but tossed and turned a lot.  Patient does not request any PRN medication at this time. Denies any withdrawal type symptoms at present.  A: Scheduled medications administered to patient per MD order. Support and encouragement provided. Routine safety checks conducted every fifteen minutes. Patient informed to notify staff with problems or concerns. Frequent verbal contact made.   R: No adverse drug reactions noted. Patient contracts for safety at this time. Patient is compliant with medications and treatment plan. Patient receptive, calm and cooperative. Patient interacts with others appropriately on unit at present. Patient remains safe at present.

## 2023-02-13 MED ORDER — DULOXETINE HCL 40 MG PO CPEP: 80.0000 mg | ORAL_CAPSULE | Freq: Every day | ORAL | 0 refills | Status: DC

## 2023-02-13 NOTE — Progress Notes (Signed)
  [facility] Adult Case Management Discharge Plan :  Will you be returning to the same living situation after discharge:  Yes,  pt will be returning home  At discharge, do you have transportation home?: Yes,  pt's husband will be picking her up  Do you have the ability to pay for your medications: Yes, [facility]   Release of information consent forms completed and in the chart;  Patient's signature needed at discharge.  Patient to Follow up at:  Follow-up Information     [facility] Go in 7 day(s).   Why: Go to [facility] on 02/19/23 at 7:30 AM per Lorella Nimrod. Please bring your insurance card to your visit. Contact information: [address] [place] [address] [phone]                 Next level of care provider has access to [facility] Link:no  Safety Planning and Suicide Prevention discussed: Yes,  pt declined      Has patient been referred to the Quitline?: Patient does not use tobacco/nicotine products  Patient has been referred for addiction treatment: Yes, the patient will follow up with an outpatient provider for substance use disorder. Psychiatrist/APP: appointment made and Therapist: appointment made Pt will follow-up with [facility] in [facility] [address], [place] 02/13/2023, 10:13 AM

## 2023-02-13 NOTE — Progress Notes (Signed)
D: Patient alert and oriented, able to make needs known. Denies SI/HI, AVH at present. Denies pain at present. Patient goal today "to go home." Rates depression 4/10, hopelessness 2/10, and anxiety 2/10. Patient reports energy level as normal. She reports she slept well. last night. Patient does not request any PRN medication at this time. She is scheduled for discharge today.  A: Scheduled medications administered to patient per MD order. Support and encouragement provided. Routine safety checks conducted every fifteen minutes. Patient informed to notify staff with problems or concerns. Frequent verbal contact made.   R: No adverse drug reactions noted. Patient contracts for safety at this time. Patient is compliant with medications and treatment plan. Patient receptive, calm and cooperative. Patient interacts with others appropriately on unit at present. Patient remains safe at present.

## 2023-02-13 NOTE — Progress Notes (Signed)
   02/12/23 2134 02/12/23 2309  Vital Signs  Temp 98.8 F (37.1 C) 98.5 F (36.9 C)  Temp Source Oral Oral  Pulse Rate 61 (!) 51  Pulse Rate Source Monitor Monitor  Resp 18 19  BP (!) 148/103 125/71  BP Location Left Arm Left Arm  BP Method Automatic Automatic  Patient Position (if appropriate) Standing Lying  Oxygen Therapy  SpO2 98 % 98 %  O2 Device Room Air Room Air   Patient reported feeling anxious fluids encouraged and complaint PRN vistaril given per orders and reported effective. Q 15 safety checks ongoing Patient remains safe. Denies SI/HI/A/VH and verbally contracts for safety.

## 2023-02-13 NOTE — Progress Notes (Signed)
Patient discharged with all belongings, AVS, medication instructions, and had no questions at discharge. Verbalized no concerns. Discharged to care of her husband.

## 2023-02-13 NOTE — Plan of Care (Signed)

## 2023-02-13 NOTE — Group Note (Signed)
Date:  02/13/2023 Time:  10:10 AM  Group Topic/Focus:  Activity Group:  The focus of this group is to promote exercise for physical and mental wellbeing and the chance for the patients to get some fresh air.     Participation Level:  Active  Participation Quality:  Appropriate  Affect:  Appropriate  Cognitive:  Appropriate  Insight: Appropriate  Engagement in Group:  Engaged  Modes of Intervention:  Activity  Additional Comments:    Amber Campos 02/13/2023, 10:10 AM

## 2023-05-28 ENCOUNTER — Encounter: Payer: Self-pay | Admitting: Family Medicine

## 2023-05-28 DIAGNOSIS — Z1231 Encounter for screening mammogram for malignant neoplasm of breast: Secondary | ICD-10-CM

## 2023-06-07 ENCOUNTER — Emergency Department
Admission: EM | Admit: 2023-06-07 | Discharge: 2023-06-07 | Disposition: A | Discharge status: Home / Self Care | Payer: Medicaid Other | Attending: Student in an Organized Health Care Education/Training Program | Admitting: Student in an Organized Health Care Education/Training Program

## 2023-06-07 DIAGNOSIS — M79644 Pain in right finger(s): Secondary | ICD-10-CM | POA: Diagnosis present

## 2023-06-07 DIAGNOSIS — M654 Radial styloid tenosynovitis [de Quervain]: Secondary | ICD-10-CM | POA: Diagnosis not present

## 2023-06-07 NOTE — ED Provider Notes (Signed)
[facility] Emergency Department Provider Note     Event Date/Time   First MD Initiated Contact with Patient 06/07/23 2045     (approximate)   History   Hand Pain (Pt presents to ED via POV from home. Pt c/o pain in the right index finger. Pt has had injury to finger in the past. Index finger has light bruising and swelling in triage. Color to finger is appropriate and cap refill is WDL. Pt is ambulatory in triage. Pt is a and ox4. Pt resp are even and unlabored. Pt skin is dry and warm and appropriate for ethnicity. )   HPI  Amber Campos is a 47 y.o. right-handed female presents to the ED for evaluation of pain to the right index finger at the PIP.  Patient is currently under the care of [facility], reports she is scheduled to see the hand specialist on Thursday.  They previously had the patient in a foam finger splint with the index and middle fingers buddy taped.  Patient is discontinued that splinting at this time.  She now is endorsing some pain over the wrist at the thumb, without any preceding injury or trauma.  Physical Exam   Triage Vital Signs: ED Triage Vitals  Encounter Vitals Group     BP 06/07/23 1929 98/74     Systolic BP Percentile --      Diastolic BP Percentile --      Pulse Rate 06/07/23 1929 80     Resp 06/07/23 1929 16     Temp 06/07/23 1929 98.3 F (36.8 C)     Temp src --      SpO2 06/07/23 1929 98 %     Weight 06/07/23 1933 160 lb (72.6 kg)     Height 06/07/23 1933 5\' 4"  (1.626 m)     Head Circumference --      Peak Flow --      Pain Score 06/07/23 1933 8     Pain Loc --      Pain Education --      Exclude from Growth Chart --     Most recent vital signs: Vitals:   06/07/23 1929 06/07/23 2142  BP: 98/74 101/79  Pulse: 80 82  Resp: 16 17  Temp: 98.3 F (36.8 C) 98 F (36.7 C)  SpO2: 98% 99%    General Awake, no distress. NAD CV:  Good peripheral perfusion.  RESP:  Normal effort.  ABD:  No distention.   MSK:  Right hand without obvious deformity, dislocation, or effusions.  Normal composite fist on exam.  Patient mother tender palpation over the [facility] and radial wrist.  Positive Finkelstein on exam.  Right index Finger with some subtle soft tissue swelling at the PIP. NEURO: Normal interest and opposition testing to the right hand  ED Results / Procedures / Treatments   Labs (all labs ordered are listed, but only abnormal results are displayed) Labs Reviewed - No data to display   EKG   RADIOLOGY   No results found.   PROCEDURES:  Critical Care performed: No  Procedures Ace bandage  MEDICATIONS ORDERED IN ED: Medications - No data to display   IMPRESSION / MDM / ASSESSMENT AND PLAN / ED COURSE  I reviewed the triage vital signs and the nursing notes.                              Differential diagnosis  includes, but is not limited to, thumb sprain, tendinitis, OA  Patient's presentation is most consistent with acute, uncomplicated illness.  Patient's diagnosis is consistent with right thumb DeQuervain's tendinitis. Patient will be discharged home with an ace bandage to the right thumb.  No current abducted thumb spica splints available in the ED stock for placement at this time.  Patient is to follow up with [facility] hand specialist, as scheduled, as needed or otherwise directed. Patient is given ED precautions to return to the ED for any worsening or new symptoms.     FINAL CLINICAL IMPRESSION(S) / ED DIAGNOSES   Final diagnoses:  De Quervain's tenosynovitis, right     Rx / DC Orders   ED Discharge Orders     None        Note:  This document was prepared using Dragon voice recognition software and may include unintentional dictation errors.    Lissa Hoard, PA-C 06/07/23 2347    Willy Eddy, MD 06/10/23 0700

## 2023-06-07 NOTE — Discharge Instructions (Signed)
Your exam reveals some tendinitis of the thumb at the wrist. Wear the ace bandage until you are evaluated by the hand specialist. Take OTC ibuprofen and perform warm epsom salt soaks as discussed.

## 2023-06-20 ENCOUNTER — Ambulatory Visit: Payer: Medicaid Other | Attending: Cardiology | Admitting: Cardiology

## 2023-06-20 ENCOUNTER — Encounter: Payer: Self-pay | Admitting: Cardiology

## 2023-06-20 VITALS — BP 132/86 | HR 59 | Ht 64.0 in | Wt 176.8 lb

## 2023-06-20 DIAGNOSIS — R001 Bradycardia, unspecified: Secondary | ICD-10-CM | POA: Diagnosis not present

## 2023-06-20 DIAGNOSIS — E7800 Pure hypercholesterolemia, unspecified: Secondary | ICD-10-CM | POA: Diagnosis not present

## 2023-06-20 DIAGNOSIS — Z7289 Other problems related to lifestyle: Secondary | ICD-10-CM | POA: Diagnosis not present

## 2023-06-20 DIAGNOSIS — I10 Essential (primary) hypertension: Secondary | ICD-10-CM

## 2023-06-20 MED ORDER — METOPROLOL SUCCINATE ER 50 MG PO TB24: 50.0000 mg | ORAL_TABLET | Freq: Every day | ORAL | 3 refills | Status: DC

## 2023-06-20 NOTE — Progress Notes (Signed)
Cardiology Office Note:    Date:  06/20/2023   ID:  Amber Campos, DOB 10-20-1975, MRN [id number]  PCP:  Abram Sander, MD   [facility] Providers Cardiologist:  Debbe Odea, MD     Referring MD: Abram Sander, MD   Chief Complaint  Patient presents with   New Patient (Initial Visit)    Referred for cardiac evaluation of bradycardia with family history of heart attack.  Previously seen by Willa Rough and [facility] cardiology more than 10 years ago.     History of Present Illness:    Amber Campos is a 47 y.o. female with a hx of hypertension, hyperlipidemia, former smoker x 20+ years, currently vapes, anxiety who presents due to bradycardia.  She has noticed reduced heart rates over the past 6 months.  Her smart watch usually notifies her that her heart rate is low, typically in the 30s to 40s while asleep.  Sometimes heart rate has been low while awake watching TV.  Has occasional episodes of dizziness.  Takes metoprolol succinate 100 mg daily over the past 10 years due to elevated heart rates and blood pressure.  States having a lot of stress surrounding her family.  Going to jail soon for about 30 days for a DWI due to an altercation with family.  Echocardiogram 2010 EF 55 to 60%  Past Medical History:  Diagnosis Date   Anemia    Anxiety    Chronic constipation    Colon polyp 09/2014   Depression    Dyspareunia    Dysphagia    Family history of adverse reaction to anesthesia    mother and aunt has hard time waking up   GERD (gastroesophageal reflux disease)    History of chickenpox    History of kidney stones    History of recurrent UTI (urinary tract infection)    HLD (hyperlipidemia)    HTN (hypertension)    Hypogammaglobulinemia (HCC)    Migraine    Perimenopausal atrophic vaginitis    Pneumonia    Urinary frequency     Past Surgical History:  Procedure Laterality Date   ABDOMINAL HYSTERECTOMY  2015   Complete   ABDOMINAL HYSTERECTOMY      APPENDECTOMY     CESAREAN SECTION     COLONOSCOPY  09/2014   polyp removed   COLONOSCOPY WITH PROPOFOL N/A 08/21/2022   Procedure: COLONOSCOPY WITH PROPOFOL;  Surgeon: Wyline Mood, MD;  Location: [facility] ENDOSCOPY;  Service: Gastroenterology;  Laterality: N/A;   ENDOMETRIAL ABLATION     EPIGASTRIC HERNIA REPAIR N/A 09/26/2022   Procedure: HERNIA REPAIR EPIGASTRIC ADULT, open;  Surgeon: Leafy Ro, MD;  Location: [facility] ORS;  Service: General;  Laterality: N/A;   ESOPHAGOGASTRODUODENOSCOPY  09/2014   ESOPHAGOGASTRODUODENOSCOPY N/A 08/21/2022   Procedure: ESOPHAGOGASTRODUODENOSCOPY (EGD);  Surgeon: Wyline Mood, MD;  Location: [facility] ENDOSCOPY;  Service: Gastroenterology;  Laterality: N/A;   INSERTION OF MESH N/A 09/26/2022   Procedure: INSERTION OF MESH;  Surgeon: Leafy Ro, MD;  Location: [facility] ORS;  Service: General;  Laterality: N/A;   KNEE ARTHROSCOPY Bilateral 2011, 2015   OOPHORECTOMY     TONSILLECTOMY AND ADENOIDECTOMY      Current Medications: Current Meds  Medication Sig   cetirizine (ZYRTEC) 10 MG tablet Take 10 mg by mouth daily.   cholecalciferol (VITAMIN D3) 25 MCG (1000 UNIT) tablet Take 1,000 Units by mouth daily.   clonazePAM (KLONOPIN) 1 MG tablet Take 1 mg by mouth 2 (two) times daily.  DULoxetine (CYMBALTA) 60 MG capsule Take 120 mg by mouth daily.   gabapentin (NEURONTIN) 300 MG capsule Take 1 capsule (300 mg total) by mouth 3 (three) times daily.   hydrOXYzine (ATARAX) 25 MG tablet Take 25 mg by mouth 3 (three) times daily.   metoprolol succinate (TOPROL-XL) 50 MG 24 hr tablet Take 1 tablet (50 mg total) by mouth daily. Take with or immediately following a meal.   omeprazole (PRILOSEC) 40 MG capsule Take 40 mg by mouth 2 (two) times daily.   rosuvastatin (CRESTOR) 10 MG tablet Take 10 mg by mouth at bedtime.   traZODone (DESYREL) 150 MG tablet Take 150 mg by mouth at bedtime.   [DISCONTINUED] metoprolol succinate (TOPROL-XL) 100 MG 24 hr tablet Take 100 mg by mouth  daily.     Allergies:   Amoxicillin, Other, and Oxycodone-acetaminophen   Social History   Socioeconomic History   Marital status: Married    Spouse name: Myron   Number of children: 3   Years of education: Not on file   Highest education level: Not on file  Occupational History   Not on file  Tobacco Use   Smoking status: Former    Current packs/day: 0.25    Average packs/day: 0.3 packs/day for 20.0 years (5.0 ttl pk-yrs)    Types: Cigarettes    Passive exposure: Past   Smokeless tobacco: Never  Vaping Use   Vaping status: Every Day   Substances: Nicotine, THC, Flavoring  Substance and Sexual Activity   Alcohol use: Not Currently    Comment: occasional wine an dliquor   Drug use: Yes    Types: Marijuana   Sexual activity: Yes  Other Topics Concern   Not on file  Social History Narrative   Married to Nicholls, has 3 children   Right handed   Some college   Drinks caffeine free   Social Determinants of Health   Financial Resource Strain: Not on file  Food Insecurity: No Food Insecurity (02/11/2023)   Hunger Vital Sign    Worried About Running Out of Food in the Last Year: Never true    Ran Out of Food in the Last Year: Never true  Transportation Needs: No Transportation Needs (02/11/2023)   PRAPARE - Administrator, Civil Service (Medical): No    Lack of Transportation (Non-Medical): No  Physical Activity: Not on file  Stress: Not on file  Social Connections: Not on file     Family History: The patient's family history includes Breast cancer in her maternal aunt; Colon polyps in her mother; Deep vein thrombosis in an other family member; Diabetes in her mother; Heart attack in her father and maternal grandmother; Heart disease in her father, maternal aunt, maternal grandmother, and mother; Hyperlipidemia in her father and mother; Hypertension in her father and mother; Nephrolithiasis in her mother; Prostate cancer in her paternal grandfather; Stroke in her  mother; Transient ischemic attack in her father.  ROS:   Please see the history of present illness.     All other systems reviewed and are negative.  EKGs/Labs/Other Studies Reviewed:    The following studies were reviewed today:  EKG Interpretation Date/Time:  Friday June 20 2023 09:34:07 EST Ventricular Rate:  59 PR Interval:  152 QRS Duration:  74 QT Interval:  476 QTC Calculation: 471 R Axis:   17  Text Interpretation: Sinus bradycardia with sinus arrhythmia with occasional Premature ventricular complexes When compared with ECG of 20-Oct-2018 16:24, Premature ventricular complexes are now  Present Confirmed by Debbe Odea ([id number]) on 06/20/2023 9:35:43 AM    Recent Labs: 02/10/2023: ALT 17; BUN 10; Creatinine, Ser 0.72; Hemoglobin 13.8; Platelets 182; Potassium 3.6; Sodium 143  Recent Lipid Panel No results found for: "CHOL", "TRIG", "HDL", "CHOLHDL", "VLDL", "LDLCALC", "LDLDIRECT"   Risk Assessment/Calculations:             Physical Exam:    VS:  BP 132/86 (BP Location: Right Arm, Patient Position: Sitting, Cuff Size: Large)   Pulse (!) 59   Ht 5\' 4"  (1.626 m)   Wt 176 lb 12.8 oz (80.2 kg)   SpO2 100%   BMI 30.35 kg/m     Wt Readings from Last 3 Encounters:  06/20/23 176 lb 12.8 oz (80.2 kg)  06/07/23 160 lb (72.6 kg)  01/21/23 169 lb (76.7 kg)     GEN:  Well nourished, well developed in no acute distress HEENT: Normal NECK: No JVD; No carotid bruits CARDIAC: RRR, no murmurs, rubs, gallops RESPIRATORY:  Clear to auscultation without rales, wheezing or rhonchi  ABDOMEN: Soft, non-tender, non-distended MUSCULOSKELETAL:  No edema; No deformity  SKIN: Warm and dry NEUROLOGIC:  Alert and oriented x 3 PSYCHIATRIC:  Normal affect   ASSESSMENT:    1. Bradycardia   2. Primary hypertension   3. Pure hypercholesterolemia   4. Engages in vaping    PLAN:    In order of problems listed above:  Sinus bradycardia, takes Toprol-XL.  Reduce Toprol-XL to  50 mg daily.  Monitor heart rate, monitor BP. Hypertension, monitor BP with reducing Toprol-XL to 50 mg daily. Hyperlipidemia, continue Crestor 10 mg daily. Currently vapes, cessation advised.  Follow-up in 3 months.      Medication Adjustments/Labs and Tests Ordered: Current medicines are reviewed at length with the patient today.  Concerns regarding medicines are outlined above.  Orders Placed This Encounter  Procedures   EKG 12-Lead   Meds ordered this encounter  Medications   metoprolol succinate (TOPROL-XL) 50 MG 24 hr tablet    Sig: Take 1 tablet (50 mg total) by mouth daily. Take with or immediately following a meal.    Dispense:  90 tablet    Refill:  3    Patient Instructions  Medication Instructions:   DECREASE Metoprolol Succinate - Take one tablet ( 50 mg) by mouth daily.   *If you need a refill on your cardiac medications before your next appointment, please call your pharmacy*   Lab Work:  None Ordered  If you have labs (blood work) drawn today and your tests are completely normal, you will receive your results only by: MyChart Message (if you have MyChart) OR A paper copy in the mail If you have any lab test that is abnormal or we need to change your treatment, we will call you to review the results.   Testing/Procedures:  None Ordered    Follow-Up: At [facility], you and your health needs are our priority.  As part of our continuing mission to provide you with exceptional heart care, we have created designated Provider Care Teams.  These Care Teams include your primary Cardiologist (physician) and Advanced Practice Providers (APPs -  Physician Assistants and Nurse Practitioners) who all work together to provide you with the care you need, when you need it.  We recommend signing up for the patient portal called "MyChart".  Sign up information is provided on this After Visit Summary.  MyChart is used to connect with patients for Virtual  Visits (Telemedicine).  Patients are able to view lab/test results, encounter notes, upcoming appointments, etc.  Non-urgent messages can be sent to your provider as well.   To learn more about what you can do with MyChart, go to [email or web address].    Your next appointment:   3 month(s)  Provider:   You may see Debbe Odea, MD or one of the following Advanced Practice Providers on your designated Care Team:   Nicolasa Ducking, NP Eula Listen, PA-C Cadence Fransico Michael, PA-C Charlsie Quest, NP Carlos Levering, NP    Signed, Debbe Odea, MD  06/20/2023 10:45 AM    [facility]

## 2023-06-20 NOTE — Patient Instructions (Signed)
Medication Instructions:   DECREASE Metoprolol Succinate - Take one tablet ( 50 mg) by mouth daily.   *If you need a refill on your cardiac medications before your next appointment, please call your pharmacy*   Lab Work:  None Ordered  If you have labs (blood work) drawn today and your tests are completely normal, you will receive your results only by: MyChart Message (if you have MyChart) OR A paper copy in the mail If you have any lab test that is abnormal or we need to change your treatment, we will call you to review the results.   Testing/Procedures:  None Ordered    Follow-Up: At [facility], you and your health needs are our priority.  As part of our continuing mission to provide you with exceptional heart care, we have created designated Provider Care Teams.  These Care Teams include your primary Cardiologist (physician) and Advanced Practice Providers (APPs -  Physician Assistants and Nurse Practitioners) who all work together to provide you with the care you need, when you need it.  We recommend signing up for the patient portal called "MyChart".  Sign up information is provided on this After Visit Summary.  MyChart is used to connect with patients for Virtual Visits (Telemedicine).  Patients are able to view lab/test results, encounter notes, upcoming appointments, etc.  Non-urgent messages can be sent to your provider as well.   To learn more about what you can do with MyChart, go to [email or web address].    Your next appointment:   3 month(s)  Provider:   You may see Debbe Odea, MD or one of the following Advanced Practice Providers on your designated Care Team:   Nicolasa Ducking, NP Eula Listen, PA-C Cadence Fransico Michael, PA-C Charlsie Quest, NP Carlos Levering, NP

## 2023-06-24 ENCOUNTER — Other Ambulatory Visit: Payer: Self-pay | Admitting: Family Medicine

## 2023-06-24 DIAGNOSIS — R519 Headache, unspecified: Secondary | ICD-10-CM

## 2023-06-24 DIAGNOSIS — R634 Abnormal weight loss: Secondary | ICD-10-CM

## 2023-06-24 DIAGNOSIS — R2689 Other abnormalities of gait and mobility: Secondary | ICD-10-CM

## 2023-07-25 ENCOUNTER — Ambulatory Visit: Admission: RE | Admit: 2023-07-25 | Payer: Medicaid Other | Source: Ambulatory Visit

## 2023-07-30 ENCOUNTER — Ambulatory Visit: Admission: RE | Admit: 2023-07-30 | Payer: Medicaid Other | Source: Ambulatory Visit

## 2023-08-02 ENCOUNTER — Ambulatory Visit
Admission: RE | Admit: 2023-08-02 | Discharge: 2023-08-02 | Disposition: A | Discharge status: Home / Self Care | Payer: Medicaid Other | Source: Ambulatory Visit | Attending: Family Medicine | Admitting: Family Medicine

## 2023-08-02 DIAGNOSIS — R519 Headache, unspecified: Secondary | ICD-10-CM | POA: Diagnosis present

## 2023-08-02 DIAGNOSIS — R634 Abnormal weight loss: Secondary | ICD-10-CM | POA: Insufficient documentation

## 2023-08-02 DIAGNOSIS — R2689 Other abnormalities of gait and mobility: Secondary | ICD-10-CM | POA: Diagnosis present

## 2023-09-25 ENCOUNTER — Encounter: Payer: Self-pay | Admitting: Nurse Practitioner

## 2023-09-25 ENCOUNTER — Ambulatory Visit: Payer: Medicaid Other

## 2023-09-25 ENCOUNTER — Ambulatory Visit: Payer: Medicaid Other | Attending: Nurse Practitioner | Admitting: Nurse Practitioner

## 2023-09-25 VITALS — BP 136/80 | HR 70 | Ht 64.0 in | Wt 181.4 lb

## 2023-09-25 DIAGNOSIS — R002 Palpitations: Secondary | ICD-10-CM

## 2023-09-25 DIAGNOSIS — I10 Essential (primary) hypertension: Secondary | ICD-10-CM

## 2023-09-25 DIAGNOSIS — R001 Bradycardia, unspecified: Secondary | ICD-10-CM | POA: Diagnosis not present

## 2023-09-25 DIAGNOSIS — E7800 Pure hypercholesterolemia, unspecified: Secondary | ICD-10-CM | POA: Diagnosis not present

## 2023-09-25 NOTE — Patient Instructions (Signed)
Medication Instructions:  No changes *If you need a refill on your cardiac medications before your next appointment, please call your pharmacy*   Lab Work: None ordered If you have labs (blood work) drawn today and your tests are completely normal, you will receive your results only by: MyChart Message (if you have MyChart) OR A paper copy in the mail If you have any lab test that is abnormal or we need to change your treatment, we will call you to review the results.   Follow-Up: At [facility], you and your health needs are our priority.  As part of our continuing mission to provide you with exceptional heart care, we have created designated Provider Care Teams.  These Care Teams include your primary Cardiologist (physician) and Advanced Practice Providers (APPs -  Physician Assistants and Nurse Practitioners) who all work together to provide you with the care you need, when you need it.  We recommend signing up for the patient portal called "MyChart".  Sign up information is provided on this After Visit Summary.  MyChart is used to connect with patients for Virtual Visits (Telemedicine).  Patients are able to view lab/test results, encounter notes, upcoming appointments, etc.  Non-urgent messages can be sent to your provider as well.   To learn more about what you can do with MyChart, go to [email or web address].    Your next appointment:   1 month(s)  Provider:   Nicolasa Ducking, NP  Other Instructions ZIO XT- Long Term Monitor Instructions  Your physician has requested you wear a ZIO patch monitor for 7 days.  This is a single patch monitor. [employer] supplies one patch monitor per enrollment. Additional stickers are not available. Please do not apply patch if you will be having a Nuclear Stress Test,  Echocardiogram, Cardiac CT, MRI, or Chest Xray during the period you would be wearing the  monitor. The patch cannot be worn during these tests. You cannot remove and  re-apply the  ZIO XT patch monitor.  Your ZIO patch monitor will be mailed 3 day USPS to your address on file. It may take 3-5 days  to receive your monitor after you have been enrolled.  Once you have received your monitor, please review the enclosed instructions. Your monitor  has already been registered assigning a specific monitor serial # to you.  Billing and Patient Assistance Program Information  We have supplied [employer] with any of your insurance information on file for billing purposes. [employer] offers a sliding scale Patient Assistance Program for patients that do not have  insurance, or whose insurance does not completely cover the cost of the ZIO monitor.  You must apply for the Patient Assistance Program to qualify for this discounted rate.  To apply, please call [employer] at [phone], select option 4, select option 2, ask to apply for  Patient Assistance Program. Amber Campos will ask your household income, and how many people  are in your household. They will quote your out-of-pocket cost based on that information.  [employer] will also be able to set up a 27-month, interest-free payment plan if needed.  Applying the monitor   Shave hair from upper left chest.  Hold abrader disc by orange tab. Rub abrader in 40 strokes over the upper left chest as  indicated in your monitor instructions.  Clean area with 4 enclosed alcohol pads. Let dry.  Apply patch as indicated in monitor instructions. Patch will be placed under collarbone on left  side of chest with arrow pointing  upward.  Rub patch adhesive wings for 2 minutes. Remove white label marked "1". Remove the white  label marked "2". Rub patch adhesive wings for 2 additional minutes.  While looking in a mirror, press and release button in center of patch. A small green light will  flash 3-4 times. This will be your only indicator that the monitor has been turned on.  Do not shower for the first 24 hours. You may shower after the  first 24 hours.  Press the button if you feel a symptom. You will hear a small click. Record Date, Time and  Symptom in the Patient Logbook.  When you are ready to remove the patch, follow instructions on the last 2 pages of Patient  Logbook. Stick patch monitor onto the last page of Patient Logbook.  Place Patient Logbook in the blue and white box. Use locking tab on box and tape box closed  securely. The blue and white box has prepaid postage on it. Please place it in the mailbox as  soon as possible. Your physician should have your test results approximately 7 days after the  monitor has been mailed back to [facility].  Call [facility] Customer Care at [phone] if you have questions regarding  your ZIO XT patch monitor. Call them immediately if you see an orange light blinking on your  monitor.  If your monitor falls off in less than 4 days, contact our Monitor department at [phone].  If your monitor becomes loose or falls off after 4 days call [employer] at [phone] for  suggestions on securing your monitor

## 2023-09-25 NOTE — Progress Notes (Signed)
Office Visit    Patient Name: Amber Campos Date of Encounter: 09/25/2023  Primary Care Provider:  Abram Sander, MD Primary Cardiologist:  Debbe Odea, MD  Chief Complaint    48 y.o. female with a history of hypertension, hyperlipidemia, prior tobacco abuse, vape use, anxiety, who presents for follow-up related to bradycardia and palpitations.  Past Medical History  Subjective   Past Medical History:  Diagnosis Date   Anemia    Anxiety    Chest pain    a. 2010: Low risk stress test.   Chronic constipation    Colon polyp 09/2014   Depression    DOE (dyspnea on exertion)    a. 05/2009: Echo: EF 55-60%.  No rwma. Nl diast parameters.   Dyspareunia    Dysphagia    Family history of adverse reaction to anesthesia    mother and aunt has hard time waking up   GERD (gastroesophageal reflux disease)    History of chickenpox    History of kidney stones    History of recurrent UTI (urinary tract infection)    HLD (hyperlipidemia)    HTN (hypertension)    Hypogammaglobulinemia (HCC)    Migraine    Perimenopausal atrophic vaginitis    Pneumonia    Urinary frequency    Past Surgical History:  Procedure Laterality Date   ABDOMINAL HYSTERECTOMY  2015   Complete   ABDOMINAL HYSTERECTOMY     APPENDECTOMY     CESAREAN SECTION     COLONOSCOPY  09/2014   polyp removed   COLONOSCOPY WITH PROPOFOL N/A 08/21/2022   Procedure: COLONOSCOPY WITH PROPOFOL;  Surgeon: Wyline Mood, MD;  Location: [facility] ENDOSCOPY;  Service: Gastroenterology;  Laterality: N/A;   ENDOMETRIAL ABLATION     EPIGASTRIC HERNIA REPAIR N/A 09/26/2022   Procedure: HERNIA REPAIR EPIGASTRIC ADULT, open;  Surgeon: Leafy Ro, MD;  Location: [facility] ORS;  Service: General;  Laterality: N/A;   ESOPHAGOGASTRODUODENOSCOPY  09/2014   ESOPHAGOGASTRODUODENOSCOPY N/A 08/21/2022   Procedure: ESOPHAGOGASTRODUODENOSCOPY (EGD);  Surgeon: Wyline Mood, MD;  Location: [facility] ENDOSCOPY;  Service: Gastroenterology;  Laterality:  N/A;   INSERTION OF MESH N/A 09/26/2022   Procedure: INSERTION OF MESH;  Surgeon: Leafy Ro, MD;  Location: [facility] ORS;  Service: General;  Laterality: N/A;   KNEE ARTHROSCOPY Bilateral 2011, 2015   OOPHORECTOMY     TONSILLECTOMY AND ADENOIDECTOMY      Allergies  Allergies  Allergen Reactions   Amoxicillin Anaphylaxis, Hives and Itching    Other Reaction(s): Itching, throat felt funny   Other Other (See Comments)    Pt allergic to "All meats"   Oxycodone-Acetaminophen Nausea And Vomiting    Allergic to the pre-combination      History of Present Illness      48 y.o. y/o female 48 y.o. female with a history of hypertension, hyperlipidemia, prior tobacco abuse, vape use, and anxiety.  She previously established care with Dr. Willa Rough in 2010 in the setting of chest pain and dyspnea.  Stress testing was undertaken and was low risk.  Echo showed EF of 55 to 60% with normal wall motion and no significant valvular disease.  More recently, she established care with Dr. Azucena Cecil in November 2024 due to bradycardia noted on her smart watch in the setting of metoprolol therapy.  Metoprolol was reduced to 50 mg daily.    Since her last visit, she has noted resolution of bradycardia, with no on most evenings of the week, she notes elevated heart  rates prior to going to bed.  She has never checked her heart rate using her smart watch during these episodes.  She sometimes feels lightheaded when her heart rate is elevated.  She denies chest pain, dyspnea, PND orthopnea, syncope, edema, or early satiety. Objective  Home Medications    Current Outpatient Medications  Medication Sig Dispense Refill   cetirizine (ZYRTEC) 10 MG tablet Take 10 mg by mouth daily.     cholecalciferol (VITAMIN D3) 25 MCG (1000 UNIT) tablet Take 1,000 Units by mouth daily.     DULoxetine (CYMBALTA) 60 MG capsule Take 120 mg by mouth daily.     hydrOXYzine (ATARAX) 25 MG tablet Take 25 mg by mouth 3 (three) times  daily.     metoprolol succinate (TOPROL-XL) 50 MG 24 hr tablet Take 1 tablet (50 mg total) by mouth daily. Take with or immediately following a meal. 90 tablet 3   omeprazole (PRILOSEC) 40 MG capsule Take 40 mg by mouth 2 (two) times daily.     rosuvastatin (CRESTOR) 10 MG tablet Take 10 mg by mouth at bedtime.     traZODone (DESYREL) 150 MG tablet Take 150 mg by mouth at bedtime.     Vitamin D, Ergocalciferol, (DRISDOL) 1.25 MG (50000 UNIT) CAPS capsule Take 50,000 Units by mouth every 7 (seven) days.     clonazePAM (KLONOPIN) 1 MG tablet Take 1 mg by mouth 2 (two) times daily.  (Patient not taking: Reported on 09/25/2023)     DULoxetine 40 MG CPEP Take 2 capsules (80 mg total) by mouth daily. (Patient not taking: Reported on 09/25/2023) 60 capsule 0   gabapentin (NEURONTIN) 300 MG capsule Take 1 capsule (300 mg total) by mouth 3 (three) times daily. (Patient not taking: Reported on 09/25/2023) 30 capsule 0   Magnesium Gluconate 27.5 MG TABS Take 1 tablet by mouth daily. (Patient not taking: Reported on 06/20/2023)     meloxicam (MOBIC) 15 MG tablet Take 15 mg by mouth daily as needed for pain. (Patient not taking: Reported on 06/20/2023)     No current facility-administered medications for this visit.     Physical Exam    VS:  BP 136/80 (BP Location: Left Arm, Patient Position: Sitting, Cuff Size: Normal)   Pulse 70   Ht 5\' 4"  (1.626 m)   Wt 181 lb 6 oz (82.3 kg)   SpO2 96%   BMI 31.13 kg/m  , BMI Body mass index is 31.13 kg/m.       GEN: Well nourished, well developed, in no acute distress. HEENT: normal. Neck: Supple, no JVD, carotid bruits, or masses. Cardiac: RRR, no murmurs, rubs, or gallops. No clubbing, cyanosis, edema.  Radials 2+/PT 2+ and equal bilaterally.  Respiratory:  Respirations regular and unlabored, clear to auscultation bilaterally. GI: Soft, nontender, nondistended, BS + x 4. MS: no deformity or atrophy. Skin: warm and dry, no rash. Neuro:  Strength and sensation  are intact. Psych: Normal affect.  Accessory Clinical Findings    ECG personally reviewed by me today - EKG Interpretation Date/Time:  Thursday September 25 2023 10:46:12 EST Ventricular Rate:  70 PR Interval:  146 QRS Duration:  80 QT Interval:  448 QTC Calculation: 483 R Axis:   7  Text Interpretation: Normal sinus rhythm Prolonged QT Confirmed by Nicolasa Ducking [id number]) on 09/25/2023 10:58:14 AM  - no acute changes.  Lab Results  Component Value Date   WBC 6.9 02/10/2023   HGB 13.8 02/10/2023   HCT 40.6 02/10/2023  MCV 89.2 02/10/2023   PLT 182 02/10/2023   Lab Results  Component Value Date   CREATININE 0.72 02/10/2023   BUN 10 02/10/2023   NA 143 02/10/2023   K 3.6 02/10/2023   CL 109 02/10/2023   CO2 25 02/10/2023   Lab Results  Component Value Date   ALT 17 02/10/2023   AST 24 02/10/2023   ALKPHOS 55 02/10/2023   BILITOT 1.6 (H) 02/10/2023      Assessment & Plan    1.  Sinus bradycardia/palpitations: At last visit in November, patient reported bradycardia which was noted on her Apple Watch, with rates dropping to the 30s and 40s.  Her metoprolol was reduced to 50 mg daily.  Since then, she is not aware of any issues with bradycardia, though has been having palpitations and elevated heart rates, typically prior to going to bed at night.  These are sometimes associated with lightheadedness and or a thumping sensation in her chest.  She has never used her watch to see how fast her heart rate is during the spells.  We discussed options for evaluation including her trying to capture her heart rate and rhythm during these episodes on her Apple Watch versus placing a 7-day ZIO XT.  She prefers the latter option and we will mail her a ZIO XT with plan for follow-up in 1 month.  Continue current dose of metoprolol until we can identify if she is experiencing any rhythm disturbances.  2.  Primary hypertension: Blood pressure is stable on Toprol XL.  3.  Hyperlipidemia:  She remains on statin therapy.  Lipids are followed by primary care.  4.  Vape use: Cessation advised.  5.  Disposition: Follow-up ZIO XT.  Follow-up in clinic in 1 month or sooner if necessary.  Nicolasa Ducking, NP 09/25/2023, 11:20 AM

## 2023-10-29 DIAGNOSIS — R002 Palpitations: Secondary | ICD-10-CM | POA: Diagnosis not present

## 2023-11-05 ENCOUNTER — Ambulatory Visit: Payer: Medicaid Other | Admitting: Nurse Practitioner

## 2023-11-05 ENCOUNTER — Encounter: Payer: Self-pay | Admitting: Nurse Practitioner

## 2023-11-05 NOTE — Progress Notes (Deleted)
 Office Visit    Patient Name: Amber Campos Date of Encounter: 11/05/2023  Primary Care Provider:  Abram Sander, MD Primary Cardiologist:  Debbe Odea, MD  Chief Complaint    48 y.o. female  with a history of hypertension, hyperlipidemia, prior tobacco abuse, vape use, anxiety, who presents for follow-up related to bradycardia and palpitations.   Past Medical History  Subjective   Past Medical History:  Diagnosis Date   Anemia    Anxiety    Chest pain    a. 2010: Low risk stress test.   Chronic constipation    Colon polyp 09/2014   Depression    DOE (dyspnea on exertion)    a. 05/2009: Echo: EF 55-60%.  No rwma. Nl diast parameters.   Dyspareunia    Dysphagia    Family history of adverse reaction to anesthesia    mother and aunt has hard time waking up   GERD (gastroesophageal reflux disease)    History of chickenpox    History of kidney stones    History of recurrent UTI (urinary tract infection)    HLD (hyperlipidemia)    HTN (hypertension)    Hypogammaglobulinemia (HCC)    Migraine    Perimenopausal atrophic vaginitis    Pneumonia    PSVT (paroxysmal supraventricular tachycardia) (HCC)    a. 09/2023 Zio: Predominantly sinus rhythm with average of 70 (43-185).  3 SVT runs-fastest 4 beats at 185, longest 21.5 seconds at 108.  Rare PACs and PVCs.  No A-fib/flutter.  No high-grade AV block.  No triggered events.   Sinus bradycardia    a. 2024 - noted on apple watch - metoprolol reduced.   Urinary frequency    Past Surgical History:  Procedure Laterality Date   ABDOMINAL HYSTERECTOMY  2015   Complete   ABDOMINAL HYSTERECTOMY     APPENDECTOMY     CESAREAN SECTION     COLONOSCOPY  09/2014   polyp removed   COLONOSCOPY WITH PROPOFOL N/A 08/21/2022   Procedure: COLONOSCOPY WITH PROPOFOL;  Surgeon: Wyline Mood, MD;  Location: [facility] ENDOSCOPY;  Service: Gastroenterology;  Laterality: N/A;   ENDOMETRIAL ABLATION     EPIGASTRIC HERNIA REPAIR N/A 09/26/2022    Procedure: HERNIA REPAIR EPIGASTRIC ADULT, open;  Surgeon: Leafy Ro, MD;  Location: [facility] ORS;  Service: General;  Laterality: N/A;   ESOPHAGOGASTRODUODENOSCOPY  09/2014   ESOPHAGOGASTRODUODENOSCOPY N/A 08/21/2022   Procedure: ESOPHAGOGASTRODUODENOSCOPY (EGD);  Surgeon: Wyline Mood, MD;  Location: [facility] ENDOSCOPY;  Service: Gastroenterology;  Laterality: N/A;   INSERTION OF MESH N/A 09/26/2022   Procedure: INSERTION OF MESH;  Surgeon: Leafy Ro, MD;  Location: [facility] ORS;  Service: General;  Laterality: N/A;   KNEE ARTHROSCOPY Bilateral 2011, 2015   OOPHORECTOMY     TONSILLECTOMY AND ADENOIDECTOMY      Allergies  Allergies  Allergen Reactions   Amoxicillin Anaphylaxis, Hives and Itching    Other Reaction(s): Itching, throat felt funny   Other Other (See Comments)    Pt allergic to "All meats"   Oxycodone-Acetaminophen Nausea And Vomiting    Allergic to the pre-combination      History of Present Illness      48 y.o. y/o female with a history of hypertension, hyperlipidemia, prior tobacco abuse, vape use, and anxiety.  She previously established care with Dr. Willa Campos in 2010 in the setting of chest pain and dyspnea.  Stress testing was undertaken and was low risk.  Echo showed EF of 55 to 60% with  normal wall motion and no significant valvular disease.  More recently, she established care with Dr. Azucena Campos in November 2024 due to bradycardia noted on her smart watch in the setting of metoprolol therapy.  Metoprolol was reduced to 50 mg daily. Ms Amber Campos was last seen in the clinic February of 2025, at which time, patient was having episodes of palpitations and elevated heart rates. These episodes were prior to going to bed. 7 day ZIO XT was placed. ZIO monitor results showed PVC burden <1.0%.    Since her last visit, Ms. Amber Campos states to have been doing well. She has not been experiencing any chest pain, dyspnea, palpitations, PND, orthopnea, dizziness, syncope, or early satiety.   She does continue to Vape daily. Denies exercising but states to be pretty active during the day.  Objective  Home Medications    Current Outpatient Medications  Medication Sig Dispense Refill   cetirizine (ZYRTEC) 10 MG tablet Take 10 mg by mouth daily.     cholecalciferol (VITAMIN D3) 25 MCG (1000 UNIT) tablet Take 1,000 Units by mouth daily.     clonazePAM (KLONOPIN) 1 MG tablet Take 1 mg by mouth 2 (two) times daily.  (Patient not taking: Reported on 09/25/2023)     DULoxetine (CYMBALTA) 60 MG capsule Take 120 mg by mouth daily.     DULoxetine 40 MG CPEP Take 2 capsules (80 mg total) by mouth daily. (Patient not taking: Reported on 09/25/2023) 60 capsule 0   gabapentin (NEURONTIN) 300 MG capsule Take 1 capsule (300 mg total) by mouth 3 (three) times daily. (Patient not taking: Reported on 09/25/2023) 30 capsule 0   hydrOXYzine (ATARAX) 25 MG tablet Take 25 mg by mouth 3 (three) times daily.     Magnesium Gluconate 27.5 MG TABS Take 1 tablet by mouth daily. (Patient not taking: Reported on 06/20/2023)     meloxicam (MOBIC) 15 MG tablet Take 15 mg by mouth daily as needed for pain. (Patient not taking: Reported on 06/20/2023)     metoprolol succinate (TOPROL-XL) 50 MG 24 hr tablet Take 1 tablet (50 mg total) by mouth daily. Take with or immediately following a meal. 90 tablet 3   omeprazole (PRILOSEC) 40 MG capsule Take 40 mg by mouth 2 (two) times daily.     rosuvastatin (CRESTOR) 10 MG tablet Take 10 mg by mouth at bedtime.     traZODone (DESYREL) 150 MG tablet Take 150 mg by mouth at bedtime.     Vitamin D, Ergocalciferol, (DRISDOL) 1.25 MG (50000 UNIT) CAPS capsule Take 50,000 Units by mouth every 7 (seven) days.     No current facility-administered medications for this visit.     Physical Exam    VS:  There were no vitals taken for this visit. , BMI There is no height or weight on file to calculate BMI.       GEN: Well nourished, well developed, in no acute distress. HEENT:  normal. Neck: Supple, no JVD, carotid bruits, or masses. Cardiac: RRR, no murmurs, rubs, or gallops. No clubbing, cyanosis, edema.  Radials 2+/PT 2+ and equal bilaterally.  Respiratory:  Respirations regular and unlabored, clear to auscultation bilaterally. GI: Soft, nontender, nondistended, BS + x 4. MS: no deformity or atrophy. Skin: warm and dry, no rash. Neuro:  Strength and sensation are intact. Psych: Normal affect.  Accessory Clinical Findings    ECG personally reviewed by me today -    *** - no acute changes.  Lab Results  Component Value Date  WBC 6.9 02/10/2023   HGB 13.8 02/10/2023   HCT 40.6 02/10/2023   MCV 89.2 02/10/2023   PLT 182 02/10/2023   Lab Results  Component Value Date   CREATININE 0.72 02/10/2023   BUN 10 02/10/2023   NA 143 02/10/2023   K 3.6 02/10/2023   CL 109 02/10/2023   CO2 25 02/10/2023   Lab Results  Component Value Date   ALT 17 02/10/2023   AST 24 02/10/2023   ALKPHOS 55 02/10/2023   BILITOT 1.6 (H) 02/10/2023   No results found for: "CHOL", "HDL", "LDLCALC", "LDLDIRECT", "TRIG", "CHOLHDL"  No results found for: "HGBA1C" No results found for: "TSH"     Assessment & Plan    1.  ***  Nicolasa Ducking, NP 11/05/2023, 8:30 AM

## 2024-03-17 ENCOUNTER — Ambulatory Visit: Admission: EM | Admit: 2024-03-17 | Discharge: 2024-03-17 | Disposition: A | Discharge status: Home / Self Care

## 2024-03-17 DIAGNOSIS — G43801 Other migraine, not intractable, with status migrainosus: Secondary | ICD-10-CM | POA: Diagnosis not present

## 2024-03-17 MED ORDER — METOCLOPRAMIDE HCL 5 MG/ML IJ SOLN
10.0000 mg | Freq: Once | INTRAMUSCULAR | Status: AC
  Administered 2024-03-17: 10 mg via INTRAMUSCULAR

## 2024-03-17 MED ORDER — PROMETHAZINE HCL 25 MG PO TABS: 25.0000 mg | ORAL_TABLET | Freq: Four times a day (QID) | ORAL | 0 refills | Status: AC | PRN

## 2024-03-17 MED ORDER — PREDNISONE 10 MG PO TABS: ORAL_TABLET | ORAL | 0 refills | Status: DC

## 2024-03-17 MED ORDER — KETOROLAC TROMETHAMINE 60 MG/2ML IM SOLN
60.0000 mg | Freq: Once | INTRAMUSCULAR | Status: AC
  Administered 2024-03-17: 60 mg via INTRAMUSCULAR

## 2024-03-17 NOTE — ED Provider Notes (Signed)
 [facility]    CSN: [id number] Arrival date & time: 03/17/24  1704      History   Chief Complaint Chief Complaint  Patient presents with   Migraine    HPI Amber Campos is a 48 y.o. female presenting for migraine headaches x 1 week. Patient has taken Excedrin, NSAIDs, Tylenol  and Topamax without relief. Headache is on the left side and there is associated dizziness, nausea, visual disturbance in the left eye, photophobia and fatigue.  Patient says this feels like a typical migraine headache.  She is not reporting this to be the worst headache she has ever had.  No head injury or trauma.  No confusion, falls, vomiting, chest pain, palpitations, shortness of breath.  History of chronic migraines and follows up with [facility] the last appointment was 5 months ago.  HPI  Past Medical History:  Diagnosis Date   Anemia    Anxiety    Chest pain    a. 2010: Low risk stress test.   Chronic constipation    Colon polyp 09/2014   Depression    DOE (dyspnea on exertion)    a. 05/2009: Echo: EF 55-60%.  No rwma. Nl diast parameters.   Dyspareunia    Dysphagia    Family history of adverse reaction to anesthesia    mother and aunt has hard time waking up   GERD (gastroesophageal reflux disease)    History of chickenpox    History of kidney stones    History of recurrent UTI (urinary tract infection)    HLD (hyperlipidemia)    HTN (hypertension)    Hypogammaglobulinemia (HCC)    Migraine    Perimenopausal atrophic vaginitis    Pneumonia    PSVT (paroxysmal supraventricular tachycardia) (HCC)    a. 09/2023 Zio: Predominantly sinus rhythm with average of 70 (43-185).  3 SVT runs-fastest 4 beats at 185, longest 21.5 seconds at 108.  Rare PACs and PVCs.  No A-fib/flutter.  No high-grade AV block.  No triggered events.   Sinus bradycardia    a. 2024 - noted on apple watch - metoprolol  reduced.   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   Obesity hypoventilation syndrome (HCC) 11/16/2014   Occipital neuralgia of left side 11/16/2014   Chronic constipation 09/20/2014   Dysphagia 09/20/2014   GERD with esophagitis 09/20/2014   CONTUSION OF BUTTOCK 08/10/2009   Head injury 08/10/2009   CHEST WALL PAIN, ACUTE 08/08/2009   SHORTNESS OF BREATH 06/05/2009   CHEST PAIN 05/15/2009   Right sided abdominal pain 05/09/2009   SINUSITIS- ACUTE-NOS 08/17/2008   Acute upper respiratory infection 06/23/2008   TOBACCO USE 05/30/2008   Depressive disorder, not elsewhere classified 05/30/2008   Allergic rhinitis 05/30/2008   GERD 05/30/2008   Headache 05/30/2008    Past Surgical History:  Procedure Laterality Date   ABDOMINAL HYSTERECTOMY  2015   Complete   ABDOMINAL HYSTERECTOMY     APPENDECTOMY     CESAREAN SECTION     COLONOSCOPY  09/2014   polyp removed   COLONOSCOPY WITH  PROPOFOL  N/A 08/21/2022   Procedure: COLONOSCOPY WITH PROPOFOL ;  Surgeon: Therisa Bi, MD;  Location: [facility] ENDOSCOPY;  Service: Gastroenterology;  Laterality: N/A;   ENDOMETRIAL ABLATION     EPIGASTRIC HERNIA REPAIR N/A 09/26/2022   Procedure: HERNIA REPAIR EPIGASTRIC ADULT, open;  Surgeon: Jordis Laneta FALCON, MD;  Location: [facility] ORS;  Service: General;  Laterality: N/A;   ESOPHAGOGASTRODUODENOSCOPY  09/2014   ESOPHAGOGASTRODUODENOSCOPY N/A 08/21/2022   Procedure: ESOPHAGOGASTRODUODENOSCOPY (EGD);  Surgeon: Therisa Bi, MD;  Location: [facility] ENDOSCOPY;  Service: Gastroenterology;  Laterality: N/A;   INSERTION OF MESH N/A 09/26/2022   Procedure: INSERTION OF MESH;   Surgeon: Jordis Laneta FALCON, MD;  Location: [facility] ORS;  Service: General;  Laterality: N/A;   KNEE ARTHROSCOPY Bilateral 2011, 2015   OOPHORECTOMY     TONSILLECTOMY AND ADENOIDECTOMY      OB History   No obstetric history on file.      Home Medications    Prior to Admission medications   Medication Sig Start Date End Date Taking? Authorizing Provider  hydrOXYzine  (ATARAX ) 25 MG tablet Take 25 mg by mouth 3 (three) times daily.   Yes [provider]  lisinopril-hydrochlorothiazide (ZESTORETIC) 10-12.5 MG tablet LISINOPRIL-HYDROCHLOROTHIAZIDE 10-12.5 MG TABS 03/09/24  Yes [provider]  metoprolol  succinate (TOPROL -XL) 50 MG 24 hr tablet Take 1 tablet (50 mg total) by mouth daily. Take with or immediately following a meal. 06/20/23 03/17/24 Yes Agbor-Etang, Redell, MD  omeprazole  (PRILOSEC) 40 MG capsule Take 40 mg by mouth 2 (two) times daily. 01/15/23  Yes [provider]  predniSONE  (DELTASONE ) 10 MG tablet Take 6 tabs po on day 1 and decrease by 1 tab daily until complete 03/17/24  Yes Arvis Huxley B, PA-C  promethazine  (PHENERGAN ) 25 MG tablet Take 1 tablet (25 mg total) by mouth every 6 (six) hours as needed for nausea or vomiting. 03/17/24  Yes Arvis Huxley B, PA-C  QUEtiapine (SEROQUEL) 100 MG tablet TAKE 1 TABLET BY MOUTH EVERYDAY AT BEDTIME 03/09/24  Yes [provider]  rosuvastatin  (CRESTOR ) 10 MG tablet Take 10 mg by mouth at bedtime. 01/15/23  Yes [provider]  topiramate (TOPAMAX) 25 MG tablet TOPIRAMATE 25 MG TABS 03/09/24  Yes [provider]  cetirizine (ZYRTEC) 10 MG tablet Take 10 mg by mouth daily.    [provider]  cholecalciferol (VITAMIN D3) 25 MCG (1000 UNIT) tablet Take 1,000 Units by mouth daily.    [provider]  clonazePAM  (KLONOPIN ) 1 MG tablet Take 1 mg by mouth 2 (two) times daily.  Patient not taking: Reported on 09/25/2023 10/01/13   [provider]  DULoxetine  (CYMBALTA ) 60 MG capsule  Take 120 mg by mouth daily.    [provider]  DULoxetine  40 MG CPEP Take 2 capsules (80 mg total) by mouth daily. Patient not taking: Reported on 09/25/2023 02/13/23 06/20/23  Lee, Jacqueline Eun, NP  gabapentin  (NEURONTIN ) 300 MG capsule Take 1 capsule (300 mg total) by mouth 3 (three) times daily. Patient not taking: Reported on 09/25/2023 09/27/22   Jordis Laneta F, MD  Magnesium  Gluconate 27.5 MG TABS Take 1 tablet by mouth daily. Patient not taking: Reported on 06/20/2023    [provider]  meloxicam  (MOBIC ) 15 MG tablet Take 15 mg by mouth daily as needed for pain. Patient not taking: Reported on 06/20/2023 11/20/22   [provider]  traZODone (DESYREL) 150 MG tablet Take 150 mg by mouth at bedtime.    [provider]  Vitamin D, Ergocalciferol, (DRISDOL)  1.25 MG (50000 UNIT) CAPS capsule Take 50,000 Units by mouth every 7 (seven) days.    [provider]  linaclotide (LINZESS) 290 MCG CAPS capsule Take by mouth. Patient not taking: Reported on 09/25/2023 02/11/19 12/29/20  [provider]    Family History Family History  Problem Relation Age of Onset   Hypertension Mother    Hyperlipidemia Mother    Diabetes Mother    Nephrolithiasis Mother        Father   Stroke Mother    Heart disease Mother    Colon polyps Mother    Heart attack Father    Heart disease Father    Hypertension Father    Hyperlipidemia Father    Transient ischemic attack Father    Heart disease Maternal Aunt    Breast cancer Maternal Aunt    Heart attack Maternal Grandmother    Heart disease Maternal Grandmother    Prostate cancer Paternal Grandfather    Deep vein thrombosis Other     Social History Social History   Tobacco Use   Smoking status: Former    Current packs/day: 0.25    Average packs/day: 0.3 packs/day for 20.0 years (5.0 ttl pk-yrs)    Types: Cigarettes    Passive exposure: Past   Smokeless tobacco: Never  Vaping Use   Vaping status:  Every Day   Substances: Nicotine, THC, Flavoring  Substance Use Topics   Alcohol use: Not Currently    Comment: occasional wine an dliquor   Drug use: Not Currently    Types: Marijuana     Allergies   Amoxicillin, Other, and Oxycodone -acetaminophen    Review of Systems Review of Systems  Constitutional:  Negative for fatigue.  Eyes:  Positive for photophobia and visual disturbance.  Respiratory:  Negative for shortness of breath.   Cardiovascular:  Negative for chest pain and palpitations.  Gastrointestinal:  Positive for nausea. Negative for vomiting.  Neurological:  Positive for dizziness and headaches. Negative for tremors, seizures, syncope, facial asymmetry, speech difficulty, weakness and numbness.  Psychiatric/Behavioral:  Negative for confusion.      Physical Exam Triage Vital Signs ED Triage Vitals  Encounter Vitals Group     BP      Girls Systolic BP Percentile      Girls Diastolic BP Percentile      Boys Systolic BP Percentile      Boys Diastolic BP Percentile      Pulse      Resp      Temp      Temp src      SpO2      Weight      Height      Head Circumference      Peak Flow      Pain Score      Pain Loc      Pain Education      Exclude from Growth Chart    No data found.  Updated Vital Signs BP 130/89 (BP Location: Right Arm)   Pulse 69   Temp 98.6 F (37 C) (Oral)   Resp 17   SpO2 97%   Physical Exam Vitals and nursing note reviewed.  Constitutional:      General: She is not in acute distress.    Appearance: Normal appearance. She is not ill-appearing or toxic-appearing.  HENT:     Head: Normocephalic and atraumatic.     Nose: Nose normal.     Mouth/Throat:     Mouth: Mucous membranes are  moist.     Pharynx: Oropharynx is clear.  Eyes:     General: No scleral icterus.       Right eye: No discharge.        Left eye: No discharge.     Extraocular Movements: Extraocular movements intact.     Conjunctiva/sclera: Conjunctivae normal.      Pupils: Pupils are equal, round, and reactive to light.  Cardiovascular:     Rate and Rhythm: Normal rate and regular rhythm.     Heart sounds: Normal heart sounds.  Pulmonary:     Effort: Pulmonary effort is normal. No respiratory distress.     Breath sounds: Normal breath sounds.  Musculoskeletal:     Cervical back: Neck supple.  Skin:    General: Skin is dry.  Neurological:     General: No focal deficit present.     Mental Status: She is alert and oriented to person, place, and time. Mental status is at baseline.     Cranial Nerves: No cranial nerve deficit.     Motor: No weakness.     Coordination: Coordination normal.     Gait: Gait normal.     Comments: 5 out of 5 strength bilateral upper and lower extremities  Psychiatric:        Mood and Affect: Mood normal.        Behavior: Behavior normal.      UC Treatments / Results  Labs (all labs ordered are listed, but only abnormal results are displayed) Labs Reviewed - No data to display  EKG   Radiology No results found. Study Result  Narrative & Impression  CLINICAL DATA:  Imbalance. Headache, unspecified headache type. Unintentional weight loss.   EXAM: MRI HEAD WITHOUT CONTRAST   TECHNIQUE: Multiplanar, multiecho pulse sequences of the brain and surrounding structures were obtained without intravenous contrast.   COMPARISON:  MRI of the brain August 28, 2005.   FINDINGS: Brain: No acute infarction, hemorrhage, hydrocephalus, extra-axial collection or mass lesion. Small amount of T2 hyperintense foci within the white matter of the cerebral hemispheres, the most prominent in the right corona radiata, nonspecific. Some foci are new since prior MRI. Again seen is T2 hyperintense structure in the left corona radiata suggestive of prominent perivascular space.   Vascular: Normal flow voids.   Skull and upper cervical spine: Normal marrow signal.   Sinuses/Orbits: Negative.   Other: None.    IMPRESSION: 1. No acute intracranial abnormality. 2. Small amount of T2 hyperintense foci within the white matter of the cerebral hemispheres, nonspecific. Some foci are new since prior MRI. Differential diagnosis include migraine related, demyelination, autoimmune and post inflammatory/infectious processes.     Electronically Signed   By: Katyucia  de Macedo Rodrigues M.D.   On: 08/19/2023 13:00     Procedures Procedures (including critical care time)  Medications Ordered in UC Medications  ketorolac  (TORADOL ) injection 60 mg (60 mg Intramuscular Given 03/17/24 1748)  metoCLOPramide  (REGLAN ) injection 10 mg (10 mg Intramuscular Given 03/17/24 1748)    Initial Impression / Assessment and Plan / UC Course  I have reviewed the triage vital signs and the nursing notes.  Pertinent labs & imaging results that were available during my care of the patient were reviewed by me and considered in my medical decision making (see chart for details).   48 year old female with history of chronic migraines presents for approximately 1 week history of migraine headache.  Reports it waxes and wanes in intensity.  Is associated with nausea  without vomiting, dizziness, visual disturbance of the left eye and photophobia.  This is not the worst headache she has ever had.  Vitals are stable and normal.  Overall well-appearing.  Normal cranial nerve exam.  Positive photophobia.  5-5 strength bilateral upper and lower extremities.  Normal gait.  Alert and oriented x 3.  Chest clear.  Heart regular rate and rhythm.  Reviewed MRI of the brain obtained January of this year which shows brain lesions consistent with migraine history.  Migraine headache with status migrainosus.  Discussed treatment with patient.  Patient was given 60 milligrams IM ketorolac  and 10 mg IM Reglan .  Has a family member to take her home today.  Prednisone  taper to start tomorrow as well as promethazine  as needed for dizziness and nausea.   Patient has been prescribed prednisone  taper by neurology for long-lasting migraines in the past.  Supportive care.  Reviewed avoiding triggers and continuing home meds.  Advised continue to keep follow-up with neurology.  Reviewed ER precautions.   Final Clinical Impressions(s) / UC Diagnoses   Final diagnoses:  Other migraine with status migrainosus, not intractable     Discharge Instructions      -You were given ketorolac  anti-inflammatory medicine and Reglan  anti-nausea medicine in clinic -If still having headache tomorrow start prednisone  dosepack and may use promethazine  as needed for nausea and dizziness. May continue Tylenol  -Rest and increase fluids -Avoid triggers -Go to ER if migraine not improving in the next 24-48 hours or if symptoms worsen--worsening vision changes, vomiting, increased dizziness, confusion, weakness, numbness, etc     ED Prescriptions     Medication Sig Dispense Auth. Provider   predniSONE  (DELTASONE ) 10 MG tablet Take 6 tabs po on day 1 and decrease by 1 tab daily until complete 21 tablet Arvis Huxley B, PA-C   promethazine  (PHENERGAN ) 25 MG tablet Take 1 tablet (25 mg total) by mouth every 6 (six) hours as needed for nausea or vomiting. 15 tablet Muriel Wilber B, PA-C      PDMP not reviewed this encounter.   Arvis Huxley NOVAK, PA-C 03/17/24 [phone]

## 2024-03-17 NOTE — ED Triage Notes (Signed)
 Patient states that she has had a migraine for a week. She's taken her preventative meds, Excedrin, goody powders.

## 2024-03-17 NOTE — Discharge Instructions (Addendum)
-  You were given ketorolac  anti-inflammatory medicine and Reglan  anti-nausea medicine in clinic -If still having headache tomorrow start prednisone  dosepack and may use promethazine  as needed for nausea and dizziness. May continue Tylenol  -Rest and increase fluids -Avoid triggers -Go to ER if migraine not improving in the next 24-48 hours or if symptoms worsen--worsening vision changes, vomiting, increased dizziness, confusion, weakness, numbness, etc
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
## 2024-06-27 ENCOUNTER — Emergency Department

## 2024-06-27 ENCOUNTER — Emergency Department
Admission: EM | Admit: 2024-06-27 | Discharge: 2024-06-27 | Disposition: A | Discharge status: Home / Self Care | Attending: Emergency Medicine | Admitting: Emergency Medicine

## 2024-06-27 ENCOUNTER — Encounter: Payer: Self-pay | Admitting: Emergency Medicine

## 2024-06-27 ENCOUNTER — Other Ambulatory Visit: Payer: Self-pay

## 2024-06-27 DIAGNOSIS — S62616A Displaced fracture of proximal phalanx of right little finger, initial encounter for closed fracture: Secondary | ICD-10-CM | POA: Diagnosis not present

## 2024-06-27 DIAGNOSIS — W108XXA Fall (on) (from) other stairs and steps, initial encounter: Secondary | ICD-10-CM | POA: Diagnosis not present

## 2024-06-27 DIAGNOSIS — S62619A Displaced fracture of proximal phalanx of unspecified finger, initial encounter for closed fracture: Secondary | ICD-10-CM

## 2024-06-27 DIAGNOSIS — S50312A Abrasion of left elbow, initial encounter: Secondary | ICD-10-CM | POA: Diagnosis not present

## 2024-06-27 DIAGNOSIS — M7989 Other specified soft tissue disorders: Secondary | ICD-10-CM | POA: Diagnosis not present

## 2024-06-27 DIAGNOSIS — S0081XA Abrasion of other part of head, initial encounter: Secondary | ICD-10-CM | POA: Diagnosis not present

## 2024-06-27 DIAGNOSIS — S6991XA Unspecified injury of right wrist, hand and finger(s), initial encounter: Secondary | ICD-10-CM | POA: Diagnosis present

## 2024-06-27 MED ORDER — KETOROLAC TROMETHAMINE 15 MG/ML IJ SOLN
15.0000 mg | Freq: Once | INTRAMUSCULAR | Status: AC
  Administered 2024-06-27: 15 mg via INTRAMUSCULAR
  Filled 2024-06-27: qty 1

## 2024-06-27 NOTE — ED Triage Notes (Signed)
 Fall down steps around 9p tonight landing outstretched on right hand. Abrasion to right hand, left elbow, and forehead.   + CSM, reports unable to move pinky.

## 2024-06-27 NOTE — ED Provider Notes (Signed)
 [facility] Provider Note    Event Date/Time   First MD Initiated Contact with Patient 06/27/24 2201     (approximate)   History   Hand Injury (Right)   HPI  Amber Campos is a 48 y.o. female who comes ED complaining of right hand pain after a fall.  She was in her usual state of health, lost her balance on the bottom stair outside, falling onto concrete.  Braced her fall with her hands, had mild facial impact with forehead abrasion.  Denies headache or neck pain or vision change.  Does not take blood thinners.  Records reviewed noting last tetanus shot June 2023.  Has abrasion over the left elbow, but denies left arm pain.  Only pain complaint is the right hand      Physical Exam   Triage Vital Signs: ED Triage Vitals  Encounter Vitals Group     BP 06/27/24 2156 (!) 142/99     Girls Systolic BP Percentile --      Girls Diastolic BP Percentile --      Boys Systolic BP Percentile --      Boys Diastolic BP Percentile --      Pulse Rate 06/27/24 2156 100     Resp 06/27/24 2156 17     Temp 06/27/24 2156 97.9 F (36.6 C)     Temp Source 06/27/24 2156 Oral     SpO2 06/27/24 2156 100 %     Weight 06/27/24 2157 215 lb (97.5 kg)     Height 06/27/24 2157 5' 4 (1.626 m)     Head Circumference --      Peak Flow --      Pain Score 06/27/24 2156 8     Pain Loc --      Pain Education --      Exclude from Growth Chart --     Most recent vital signs: Vitals:   06/27/24 2156  BP: (!) 142/99  Pulse: 100  Resp: 17  Temp: 97.9 F (36.6 C)  SpO2: 100%    General: Awake, no distress.  CV:  Good peripheral perfusion.  Normal distal pulses Resp:  Normal effort.  Abd:  No distention.  Other:  Very slight abrasion over the central forehead, no hematoma.  Head otherwise atraumatic.  No midline spinal tenderness, full range of motion in the C-spine.  No laceration of the hand.  There is deformity and swelling at the base of the right fifth finger  with limited flexion range of motion.  Intact range of motion of all other fingers.  No lacerations   ED Results / Procedures / Treatments   Labs (all labs ordered are listed, but only abnormal results are displayed) Labs Reviewed - No data to display   RADIOLOGY X-ray right hand interpreted by me, shows fracture at the proximal phalanx of the right fifth finger.  No dislocation.  Radiology report reviewed   PROCEDURES:  Procedures   MEDICATIONS ORDERED IN ED: Medications  ketorolac  (TORADOL ) 15 MG/ML injection 15 mg (15 mg Intramuscular Given 06/27/24 2227)     IMPRESSION / MDM / ASSESSMENT AND PLAN / ED COURSE  I reviewed the triage vital signs and the nursing notes.                              Differential diagnosis includes, but is not limited to, finger dislocation, finger fracture  Patient presents with right  hand pain after mechanical fall.  X-ray shows fracture at the base of the right fifth metacarpal.  Will splint, refer to hand surgery.     FINAL CLINICAL IMPRESSION(S) / ED DIAGNOSES   Final diagnoses:  Fracture of proximal phalanx of right hand, closed, initial encounter     Rx / DC Orders   ED Discharge Orders     None        Note:  This document was prepared using Dragon voice recognition software and may include unintentional dictation errors.   Viviann Pastor, MD 06/27/24 [phone]

## 2024-06-27 NOTE — Discharge Instructions (Signed)
 Your x-ray shows a fracture of the first bone of the right fifth finger.  Keep this splinted, and make an appointment with hand surgery Dr. Ezra this week for further evaluation.

## 2024-07-01 NOTE — H&P (View-Only) (Signed)
 ORTHOPAEDIC SURGERY- CLINIC NOTE  Chief Complaint: Right hand pain  History of Present Illness:  Amber Campos is a 48 y.o. female who presents today for the above.  The patient is being seen as an ED follow-up.  Patient sustained a fall on 06/27/2024 resulting in injury to her right hand.  She was found to have a fracture of the proximal phalanx of the right small finger.  She was placed into a splint.  These notes were reviewed for this visit.  Today, she is doing okay.  Pain is fairly well-controlled.  She has some numbness and tingling about the right small finger.   PMHx, PSurgHx, Fam Hx, Soc Hx, Meds, Allergies: Past Medical History:  Diagnosis Date  . Anxiety   . Chicken pox   . Chronic constipation 09/20/2014  . Colon polyp 2016  . Dysphagia 09/20/2014  . FH: colon polyps 09/20/2014  . GERD (gastroesophageal reflux disease)   . History of gastroesophageal reflux (GERD) 09/20/2014  . Hyperlipidemia   . Hypertension     Past Surgical History:  Procedure Laterality Date  . TONSILLECTOMY  1996  . Adenoidectomy  1996  . CESAREAN SECTION  2006  . UPPER GASTROINTESTINAL ENDOSCOPY  05/10/2009   Dr. Teressa @ [place] (indic: abd pain) - Minor esophagitis. Normal stomach and duodenum.  SABRA HYSTERECTOMY  2014   complete  . COLONOSCOPY  10/03/2014   Adenomatous Polyp, FH Colon Polyps (Father): [facility] 09/2017; Recall Ltr mailed 09/02/2017 (dh)  . EGD  10/03/2014   No repeat per RTE  . UPPER GASTROINTESTINAL ENDOSCOPY  10/03/2014   (indic: dysphagia, heartburn) - Esophageal mucosal changes suspicious for short-segment Barrett's in lower third of esophagus, 2 cm in length - dilated (path: normal mucosa without esophagitis). 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  SABRA COLONOSCOPY  10/03/2014   (indic: FHx polyps) - Diminutive TA in transverse colon. Medium-sized internal hemorrhoids. Repeat in 09/2017.  SABRA UPPER GASTROINTESTINAL ENDOSCOPY  01/19/2019   Esophageal mucosal changes suspicious  for EOE (neg on bx). LA Grade A reflux esophagitis.Normal duodenum  . COLONOSCOPY  01/19/2019   (indic: PHx polyps, FHx polyps) - Mild tortuosity of colon. Small internal hemorrhoids. Repeat in 01/2024.  SABRA COLONOSCOPY  01/19/2019   Internal hemorrhoids/Otherwise normal/PHx CP/Repeat 45yrs/RTE  . EGD  01/19/2019   Gastritis/No Repeat/RTE  . APPENDECTOMY    . ENDOMETRIAL ABLATION W/ NOVASURE  2008, 2010  . KNEE ARTHROSCOPY Bilateral 2011, 2015    Family History  Problem Relation Age of Onset  . Myocardial Infarction (Heart attack) Mother   . Heart disease Mother   . Diabetes type II Mother   . High blood pressure (Hypertension) Mother   . Hyperlipidemia (Elevated cholesterol) Mother   . Thyroid disease Mother   . Osteoporosis (Thinning of bones) Mother   . Colon polyps Mother   . Ulcers Mother   . High blood pressure (Hypertension) Father   . Hyperlipidemia (Elevated cholesterol) Father   . Stroke Father   . Colon polyps Father   . Heart disease Daughter   . Microcephaly Son   . Cerebral palsy Son   . Seizures Son   . ADD / ADHD Daughter   . Myocardial Infarction (Heart attack) Maternal Grandmother   . Stroke Paternal Grandmother   . Myocardial Infarction (Heart attack) Paternal Grandmother   . Parkinsonism Paternal Grandfather   . Prostate cancer Paternal Grandfather   . COPD Paternal Grandfather   . COPD Maternal Grandfather   . No  Known Problems Brother     Social History   Socioeconomic History  . Marital status: Legally Separated  Tobacco Use  . Smoking status: Former    Current packs/day: 0.00    Average packs/day: 1 pack/day for 26.3 years (26.3 ttl pk-yrs)    Types: Cigarettes    Start date: 07/13/1991    Quit date: 11/10/2017    Years since quitting: 6.6  . Smokeless tobacco: Never  . Tobacco comments:    Currently vaping.  Substance and Sexual Activity  . Alcohol use: Yes    Alcohol/week: 1.0 standard drink of alcohol    Types: 1 Standard drinks or  equivalent per week    Comment: glass of wine every once and a while  . Drug use: No  . Sexual activity: Not Currently    Partners: Male    Birth control/protection: Surgical   Social Drivers of Health   Financial Resource Strain: Medium Risk (10/20/2023)   Overall Financial Resource Strain (CARDIA)   . Difficulty of Paying Living Expenses: Somewhat hard  Food Insecurity: Food Insecurity Present (10/20/2023)   Hunger Vital Sign   . Worried About Programme Researcher, Broadcasting/film/video in the Last Year: Never true   . Ran Out of Food in the Last Year: Sometimes true  Transportation Needs: Unmet Transportation Needs (10/20/2023)   PRAPARE - Transportation   . Lack of Transportation (Medical): Yes   . Lack of Transportation (Non-Medical): No  Housing Stability: Low Risk  (10/20/2023)   Housing Stability Vital Sign   . Unable to Pay for Housing in the Last Year: No   . Number of Times Moved in the Last Year: 0   . Homeless in the Last Year: No     Current Outpatient Medications  Medication Sig Dispense Refill  . baclofen  (LIORESAL ) 5 mg tablet Take 1 tablet (5 mg total) by mouth at bedtime as needed (back pain) for up to 15 days 15 tablet 0  . butalbital-acetaminophen -caffeine (FIORICET) 50-325-40 mg tablet Take 1 tablet by mouth every 4 (four) hours as needed for Headache for up to 30 doses 30 tablet 0  . metoprolol  succinate 50 mg CSpX once daily    . nortriptyline (PAMELOR) 10 MG capsule TAKE 1 CAPSULE BY MOUTH ONCE NIGHTLY FOR 1 WEEK, THEN INCREASE TO 2 CAPSULES NIGHTLY 180 capsule 0  . omeprazole  (PRILOSEC) 40 MG DR capsule Take 1 capsule (40 mg total) by mouth 2 (two) times daily 180 capsule 0  . QUEtiapine (SEROQUEL) 25 MG tablet TAKE 3 TABS AT NIGHT FOR A WEEK, IF NEEDED CAN INCREASE TO 4 TABS AT NIGHT AND CONTINUE THAT DOSE 120 tablet 2  . QUEtiapine (SEROQUEL) 50 MG tablet Take 50 mg by mouth at bedtime    . rosuvastatin  10 mg CpSP once daily     No current facility-administered medications for  this visit.    Allergies  Allergen Reactions  . Amoxicillin Hives, Itching and Anaphylaxis  . Other Unknown    Pt allergic to All meats    Review of Systems: A 10+ ROS was performed, reviewed, and the pertinent orthopaedic findings are documented in the HPI.  I have reviewed and agree with the ROS captured by the CMA.    Physical Exam: BP 130/84   Ht 162.6 cm (5' 4)   Wt 98.4 kg (217 lb)   BMI 37.25 kg/m  General/Constitutional: No apparent distress: well-nourished and well developed. Eyes: Pupils equal, round with synchronous movement. Lymphatic: No palpable adenopathy. Respiratory: Patient  has good chest rise and fall with inspiration and expiration.  All lung fields are clear to auscultation bilaterally.  There is no Rales, rhonchi or wheezes appreciated. Cardiovascular: Upon auscultation there is a regular rate and rhythm without any murmurs, rubs, gallops or heaves appreciated. Integumentary: No impressive skin lesions present, except as noted in detailed exam. Neuro/Psych: Normal mood and affect, oriented to person, place and time. Musculoskeletal: see exam below  Right Upper Extremity: Swelling about the small finger with minimal ecchymoses.  Sensation somewhat decreased circumferentially around the finger.  Patient with very limited ability to flex or extend the finger.  Patient able to make a full composite fist with all of the other fingers with exception of the ring finger which she lacks about 1 to 2 cm of tip to palm distance.  Pain to palpation about the fracture site.  The digits are warm and well-perfused otherwise.  Imaging and Results: Radiographs of the right hand obtained on 06/27/2024 at the [facility] were personally reviewed.There is an extra-articular fracture of the base of the proximal phalanx of the small finger.  There appears to be a small amount of comminution at the fracture site.    Radiographs of the right hand were obtained in clinic today and  personally reviewed.  They demonstrate a small finger proximal phalanx base fracture that is extra-articular.  On the PA view it appears fairly well aligned, however, on the lateral there is a significant extension deformity noted.  Assessment & Plan: Amber Campos is a 48 y.o. female patient with a right small finger proximal phalanx base fracture - Given the significant extension deformity of the fracture I have recommended surgical fixation - We discussed the risk, benefits, alternatives to surgery.  Risks that we discussed include but are not limited to infection, pin site infection, nonunion, malunion, stiffness, chronic pain, need for revision surgery - Would like to proceed with surgical intervention - We discussed the expected postoperative course - Placed back into ulnar gutter splint - Follow-up postoperatively  Jackquline CANDIE Barrack, MD [facility] [address] [place], [place] [address] Phone: [phone]  This note was generated in part with voice recognition software; please excuse any typographical errors that were not detected and corrected.

## 2024-07-02 ENCOUNTER — Other Ambulatory Visit: Payer: Self-pay

## 2024-07-05 ENCOUNTER — Inpatient Hospital Stay
Admission: RE | Admit: 2024-07-05 | Discharge: 2024-07-05 | Disposition: A | Discharge status: Home / Self Care | Source: Ambulatory Visit

## 2024-07-05 NOTE — Pre-Procedure Instructions (Signed)
No answer left VM

## 2024-07-06 ENCOUNTER — Ambulatory Visit

## 2024-07-06 ENCOUNTER — Encounter: Admission: RE | Disposition: A | Discharge status: Home / Self Care | Payer: Self-pay | Source: Home / Self Care

## 2024-07-06 ENCOUNTER — Other Ambulatory Visit: Payer: Self-pay

## 2024-07-06 ENCOUNTER — Ambulatory Visit: Admission: RE | Admit: 2024-07-06 | Discharge: 2024-07-06 | Disposition: A | Discharge status: Home / Self Care

## 2024-07-06 ENCOUNTER — Ambulatory Visit: Admitting: Anesthesiology

## 2024-07-06 ENCOUNTER — Encounter: Payer: Self-pay | Admitting: Urgent Care

## 2024-07-06 DIAGNOSIS — S62616A Displaced fracture of proximal phalanx of right little finger, initial encounter for closed fracture: Secondary | ICD-10-CM | POA: Insufficient documentation

## 2024-07-06 DIAGNOSIS — I10 Essential (primary) hypertension: Secondary | ICD-10-CM | POA: Diagnosis not present

## 2024-07-06 DIAGNOSIS — Z8249 Family history of ischemic heart disease and other diseases of the circulatory system: Secondary | ICD-10-CM | POA: Insufficient documentation

## 2024-07-06 DIAGNOSIS — M25541 Pain in joints of right hand: Secondary | ICD-10-CM | POA: Diagnosis present

## 2024-07-06 DIAGNOSIS — Z87891 Personal history of nicotine dependence: Secondary | ICD-10-CM | POA: Insufficient documentation

## 2024-07-06 DIAGNOSIS — K219 Gastro-esophageal reflux disease without esophagitis: Secondary | ICD-10-CM | POA: Insufficient documentation

## 2024-07-06 DIAGNOSIS — W19XXXA Unspecified fall, initial encounter: Secondary | ICD-10-CM | POA: Insufficient documentation

## 2024-07-06 DIAGNOSIS — Z5982 Transportation insecurity: Secondary | ICD-10-CM | POA: Insufficient documentation

## 2024-07-06 HISTORY — PX: CLOSED REDUCTION FINGER WITH PERCUTANEOUS PINNING: SHX5612

## 2024-07-06 SURGERY — CLOSED REDUCTION, FINGER, WITH PERCUTANEOUS PINNING
Anesthesia: General | Site: Finger | Laterality: Right

## 2024-07-06 MED ORDER — MIDAZOLAM HCL 2 MG/2ML IJ SOLN
INTRAMUSCULAR | Status: AC
  Filled 2024-07-06: qty 2

## 2024-07-06 MED ORDER — PROPOFOL 1000 MG/100ML IV EMUL
INTRAVENOUS | Status: AC
  Filled 2024-07-06: qty 100

## 2024-07-06 MED ORDER — CLINDAMYCIN PHOSPHATE 600 MG/50ML IV SOLN
INTRAVENOUS | Status: AC
  Filled 2024-07-06: qty 50

## 2024-07-06 MED ORDER — FENTANYL CITRATE (PF) 100 MCG/2ML IJ SOLN
INTRAMUSCULAR | Status: DC | PRN
  Administered 2024-07-06: 50 ug via INTRAVENOUS

## 2024-07-06 MED ORDER — ACETAMINOPHEN 10 MG/ML IV SOLN
INTRAVENOUS | Status: AC
  Filled 2024-07-06: qty 100

## 2024-07-06 MED ORDER — BUPIVACAINE HCL (PF) 0.5 % IJ SOLN
INTRAMUSCULAR | Status: AC
  Filled 2024-07-06: qty 20

## 2024-07-06 MED ORDER — MIDAZOLAM HCL (PF) 2 MG/2ML IJ SOLN
INTRAMUSCULAR | Status: DC | PRN
  Administered 2024-07-06: 2 mg via INTRAVENOUS

## 2024-07-06 MED ORDER — CLINDAMYCIN PHOSPHATE 600 MG/50ML IV SOLN
600.0000 mg | Freq: Once | INTRAVENOUS | Status: AC
  Administered 2024-07-06: 600 mg via INTRAVENOUS

## 2024-07-06 MED ORDER — BUPIVACAINE LIPOSOME 1.3 % IJ SUSP: INTRAMUSCULAR | Status: DC | PRN

## 2024-07-06 MED ORDER — PHENYLEPHRINE 80 MCG/ML (10ML) SYRINGE FOR IV PUSH (FOR BLOOD PRESSURE SUPPORT)
PREFILLED_SYRINGE | INTRAVENOUS | Status: DC | PRN
  Administered 2024-07-06 (×4): 80 ug via INTRAVENOUS

## 2024-07-06 MED ORDER — BUPIVACAINE HCL (PF) 0.5 % IJ SOLN
INTRAMUSCULAR | Status: AC
  Filled 2024-07-06: qty 30

## 2024-07-06 MED ORDER — FENTANYL CITRATE (PF) 50 MCG/ML IJ SOSY
PREFILLED_SYRINGE | INTRAMUSCULAR | Status: AC
  Filled 2024-07-06: qty 1

## 2024-07-06 MED ORDER — PROPOFOL 10 MG/ML IV BOLUS
INTRAVENOUS | Status: AC
  Filled 2024-07-06: qty 20

## 2024-07-06 MED ORDER — BUPIVACAINE HCL (PF) 0.5 % IJ SOLN
INTRAMUSCULAR | Status: DC | PRN
  Administered 2024-07-06: 20 mL via PERINEURAL

## 2024-07-06 MED ORDER — TRAMADOL HCL 50 MG PO TABS: 50.0000 mg | ORAL_TABLET | Freq: Four times a day (QID) | ORAL | 0 refills | Status: AC | PRN

## 2024-07-06 MED ORDER — PROPOFOL 500 MG/50ML IV EMUL
INTRAVENOUS | Status: DC | PRN
Start: 2024-07-06 — End: 2024-07-06
  Administered 2024-07-06: 125 ug/kg/min via INTRAVENOUS

## 2024-07-06 MED ORDER — LACTATED RINGERS IV SOLN: INTRAVENOUS | Status: DC | PRN

## 2024-07-06 MED ORDER — ACETAMINOPHEN 10 MG/ML IV SOLN
INTRAVENOUS | Status: DC | PRN
  Administered 2024-07-06: 1000 mg via INTRAVENOUS

## 2024-07-06 SURGICAL SUPPLY — 25 items
BLADE SURG MINI STRL (BLADE) ×1 IMPLANT
BNDG ELASTIC 2INX 5YD STR LF (GAUZE/BANDAGES/DRESSINGS) ×1 IMPLANT
BNDG ELASTIC 4INX 5YD STR LF (GAUZE/BANDAGES/DRESSINGS) ×1 IMPLANT
BNDG ELASTIC 4X5.8 VLCR NS LF (GAUZE/BANDAGES/DRESSINGS) IMPLANT
BNDG ESMARCH 4X12 STRL LF (GAUZE/BANDAGES/DRESSINGS) ×1 IMPLANT
CHLORAPREP W/TINT 26 (MISCELLANEOUS) ×1 IMPLANT
CORD BIP STRL DISP 12FT (MISCELLANEOUS) ×1 IMPLANT
CUFF TOURN SGL QUICK 18X4 (TOURNIQUET CUFF) ×1 IMPLANT
DRAPE FLUOR MINI C-ARM 54X84 (DRAPES) ×1 IMPLANT
ELECTRODE REM PT RTRN 9FT ADLT (ELECTROSURGICAL) IMPLANT
GAUZE SPONGE 4X4 12PLY STRL (GAUZE/BANDAGES/DRESSINGS) ×1 IMPLANT
GAUZE XEROFORM 1X8 LF (GAUZE/BANDAGES/DRESSINGS) IMPLANT
GLOVE BIO SURGEON STRL SZ7.5 (GLOVE) ×1 IMPLANT
GLOVE BIOGEL PI IND STRL 8 (GLOVE) ×1 IMPLANT
GOWN SRG XL LVL 3 NONREINFORCE (GOWNS) ×1 IMPLANT
NS IRRIG 500ML POUR BTL (IV SOLUTION) ×1 IMPLANT
PACK EXTREMITY ARMC (MISCELLANEOUS) ×1 IMPLANT
PADDING CAST BLEND 4X4 NS (MISCELLANEOUS) IMPLANT
PADDING CAST BLEND 4X4 STRL (MISCELLANEOUS) ×2 IMPLANT
SOLN 0.9% NACL POUR BTL 500 ML (IV SOLUTION) ×1 IMPLANT
SOLN STERILE WATER 500 ML (IV SOLUTION) IMPLANT
SUT SILK 2 0 SH (SUTURE) IMPLANT
SUTURE ETHLN 4-0 PC3 18XMF (SUTURE) ×1 IMPLANT
WIRE Z .045 C-WIRE SPADE TIP (WIRE) IMPLANT
c-wire .045 IMPLANT

## 2024-07-06 NOTE — Transfer of Care (Signed)
 Immediate Anesthesia Transfer of Care Note  Patient: Amber Campos  Procedure(s) Performed: CLOSED REDUCTION, FINGER, WITH PERCUTANEOUS PINNING (Right: Finger)  Patient Location: PACU  Anesthesia Type:General  Level of Consciousness: awake  Airway & Oxygen Therapy: Patient Spontanous Breathing and Patient connected to face mask oxygen  Post-op Assessment: Report given to RN and Post -op Vital signs reviewed and stable  Post vital signs: Reviewed and stable  Last Vitals:  Vitals Value Taken Time  BP    Temp    Pulse    Resp    SpO2      Last Pain:  Vitals:   07/06/24 1027  PainSc: 0-No pain         Complications: No notable events documented.

## 2024-07-06 NOTE — Discharge Instructions (Signed)
 Discharge Instructions After Surgery   Dressings   Do not remove your dressing. Keep it clean and dry until your follow-up visit.   It is okay to have a small spot of blood on your dressing as long as it does not keep getting bigger.   Cover the dressing and keep it dry in the shower or bath. Keep your arm up and out of the water.   Diet   Return to your regular diet as tolerated.   Activity   If your fingers are free, work on making a full fist and straightening out the fingers within the limits of your splint.  Move other joints such as your elbow or shoulder multiple times a day to prevent getting stiff.  Do not try to move joints that are within a splint.  No lifting, carrying, pushing, or pulling with your arm until your follow-up visit. You may use your hand or fingers for simple things like writing, typing, eating, and brushing your teeth.   If you were given a sling, wear it while your hand or arm is numb. You can stop wearing the sling when you have feeling and strength back in your hand or arm. You may also use the sling as needed to elevate, protect, or support your arm until your follow-up visit.   Pain   The first 48 hours after surgery can hurt the most. You should use the different types of pain medicine along with rest and elevation to help keep the pain manageable.   NSAIDS such as ibuprofen  (Advil  or Motrin ) or naproxen (Aleve) can help with pain and swelling.   Acetaminophen  (Tylenol ) can be used with NSAIDs. Do not take more than 3000 mg of Tylenol  in a 24-hour period.   Narcotic pain medicine such as hydrocodone or oxycodone , should be used as prescribed. Stop taking as soon as possible. Take with food to help prevent nausea. Do not drink alcohol or drive while taking narcotics. Be aware that hydrocodone tablets often already contain acetaminophen  (Tylenol ).  Elevating your operative extremity above the level of your heart will help with pain and swelling. You can  rest your arm on several pillows for support while sleeping or resting.   Constipation   Narcotic pain medicines, changes in eating and drinking, and less activity may cause constipation after surgery.   Over-the-counter stool softeners like docusate (Colace), Senna, or Miralax can help. Follow the directions on the bottle.   Stay hydrated and try to eat high fiber foods.   When to call your surgeon   If your dressing gets wet or very dirty.   Fever more than 101 F.   Incision that is very red, swollen, draining pus, or feels hot.   Severe pain, not getting better with pain medicine.   Severe swelling, even with elevation.   If your fingers become cool, cold, or dark in color.   Bleeding that is getting bigger or soaking through the dressing.   Severe nausea and vomiting.   Problems using the bathroom or no bowel movement for 2-3 days.   For questions or concerns, please call [phone].  Jackquline CANDIE Barrack, MD [facility]

## 2024-07-06 NOTE — Anesthesia Preprocedure Evaluation (Signed)
 Anesthesia Evaluation  Patient identified by MRN, date of birth, ID band Patient awake    Reviewed: Allergy & Precautions, H&P , NPO status , Patient's Chart, lab work & pertinent test results  Airway Mallampati: II  TM Distance: >3 FB Neck ROM: full    Dental no notable dental hx.    Pulmonary neg pulmonary ROS, Patient abstained from smoking., former smoker   Pulmonary exam normal        Cardiovascular hypertension, Normal cardiovascular exam     Neuro/Psych  PSYCHIATRIC DISORDERS Anxiety Depression    negative neurological ROS     GI/Hepatic Neg liver ROS,GERD  ,,  Endo/Other  negative endocrine ROS    Renal/GU negative Renal ROS  negative genitourinary   Musculoskeletal   Abdominal  (+) + obese  Peds  Hematology negative hematology ROS (+)   Anesthesia Other Findings Past Medical History: No date: Anemia No date: Anxiety No date: Chest pain     Comment:  a. 2010: Low risk stress test. No date: Chronic constipation 09/2014: Colon polyp No date: Depression No date: DOE (dyspnea on exertion)     Comment:  a. 05/2009: Echo: EF 55-60%.  No rwma. Nl diast               parameters. 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 09/2023 Zio: Predominantly sinus rhythm with average               of 70 (43-185).  3 SVT runs-fastest 4 beats at 185,               longest 21.5 seconds at 108.  Rare  PACs and PVCs.  No               A-fib/flutter.  No high-grade AV block.  No triggered               events. No date: Sinus bradycardia     Comment:  a. 2024 - noted on apple watch - metoprolol  reduced. No date: Urinary frequency  Past Surgical History: 2015: ABDOMINAL HYSTERECTOMY     Comment:  Complete No date: ABDOMINAL HYSTERECTOMY No date: APPENDECTOMY No date: CESAREAN SECTION 09/2014: COLONOSCOPY     Comment:  polyp removed 08/21/2022: COLONOSCOPY WITH PROPOFOL ; N/A     Comment:  Procedure: COLONOSCOPY WITH PROPOFOL ;  Surgeon: Therisa Bi, MD;  Location: [facility] ENDOSCOPY;  Service:               Gastroenterology;  Laterality: N/A; No date: ENDOMETRIAL ABLATION 09/26/2022: EPIGASTRIC HERNIA REPAIR; N/A     Comment:  Procedure: HERNIA REPAIR EPIGASTRIC ADULT, open;                Surgeon: Jordis Laneta FALCON, MD;  Location: [facility] ORS;                Service: General;  Laterality: N/A; 09/2014: ESOPHAGOGASTRODUODENOSCOPY 08/21/2022: ESOPHAGOGASTRODUODENOSCOPY; N/A     Comment:  Procedure: ESOPHAGOGASTRODUODENOSCOPY (EGD);  Surgeon:               Therisa Bi, MD;  Location: [facility] ENDOSCOPY;  Service:               Gastroenterology;  Laterality: N/A; 09/26/2022: INSERTION OF MESH; N/A     Comment:  Procedure: INSERTION OF MESH;  Surgeon: Jordis Laneta FALCON,               MD;  Location: [facility] ORS;  Service: General;  Laterality:               N/A; 2011, 2015: KNEE ARTHROSCOPY; Bilateral No date: OOPHORECTOMY No date: TONSILLECTOMY AND ADENOIDECTOMY  BMI    Body Mass Index: 36.90 kg/m      Reproductive/Obstetrics negative OB ROS                              Anesthesia Physical Anesthesia Plan  ASA: 2  Anesthesia Plan: General   Post-op Pain Management: Regional block*   Induction: Intravenous  PONV Risk Score and Plan: Propofol  infusion and TIVA  Airway Management Planned: Natural Airway  Additional Equipment:   Intra-op Plan:    Post-operative Plan:   Informed Consent: I have reviewed the patients History and Physical, chart, labs and discussed the procedure including the risks, benefits and alternatives for the proposed anesthesia with the patient or authorized representative who has indicated his/her understanding and acceptance.     Dental Advisory Given  Plan Discussed with: CRNA and Surgeon  Anesthesia Plan Comments:          Anesthesia Quick Evaluation

## 2024-07-06 NOTE — Op Note (Signed)
 Date of procedure:  07/06/2024  Surgeon:  Jackquline CANDIE Barrack, MD   Procedure(s) performed:   Closed reduction and percutaneous pinning proximal phalanx fracture, right small finger (CPT [phone]) Application of short arm splint (CPT [phone])   Assistant(s): none   Preoperative diagnosis:   Closed displaced fracture of proximal phalanx of right little finger, initial encounter S62.616A Pain in joints of right hand M25.541    Postoperative diagnosis:   Closed displaced fracture of proximal phalanx of right little finger, initial encounter S62.616APain in joints of right hand M25.541    Procedure findings:  as above  Anesthesia:  Regional + MAC   Estimated blood loss:  Minimal   Specimen:  None   Complications:  None apparent   Implants:  Implant Name Type Inv. Item Serial No. Manufacturer Lot No. LRB No. Used Action  c-wire .954     [id number] Right 2 Implanted      Indications: 48 y.o. year old female with right proximal phalanx fracture of the small finger with an extension deformity and malrotation deformity.  We discussed operative treatment and explained the risks, benefits, and alternatives as well as the expected postoperative course.  The patient elected to proceed with surgery.   Procedure in detail:  The patient was met in the pre-op holding area and the right upper extremity was marked and the consent confirmed.  Anesthesia placed a short acting regional block.  The patient was then taken to the operating room and the hand table was attached to the stretcher.  Antibiotics were administered prior to incision.  All bony prominences were well padded.  A tourniquet was placed high on the operative extremity.  The operative extremity was then prepped and draped in the usual sterile fashion.  A timeout was performed confirming the correct patient, site, and procedure.   An Esmarch was wrapped around the extremity and the tourniquet was insufflated to 250 mmHg.  Under fluoroscopic  guidance, the proximal phalanx fracture was closed reduced.  Two crossing 0.045 K wires were placed in an anterograde fashion stabilizing the fracture.  Fluoroscopy was used to confirm adequate reduction and hardware placement. Tenodesis effect was used to check the rotational alignment of the finger, which was consistent with the contralateral small finger. The wires were bent and cut.  The base of the wires were dressed with Xeroform and gauze.  A sterile dressing was placed and an ulnar gutter splint was placed on the operative extremity.  The tourniquet was let down and the patient was transferred to the PACU in stable condition  Post-Operative Plan: 4 weeks of ulnar gutter immobilization Pins will be pulled at 4 weeks postoperatively

## 2024-07-06 NOTE — Anesthesia Postprocedure Evaluation (Signed)
 Anesthesia Post Note  Patient: Amber Campos  Procedure(s) Performed: CLOSED REDUCTION, FINGER, WITH PERCUTANEOUS PINNING (Right: Finger)  Patient location during evaluation: PACU Anesthesia Type: General Level of consciousness: awake and alert Pain management: pain level controlled Vital Signs Assessment: post-procedure vital signs reviewed and stable Respiratory status: spontaneous breathing, nonlabored ventilation, respiratory function stable and patient connected to nasal cannula oxygen Cardiovascular status: blood pressure returned to baseline and stable Postop Assessment: no apparent nausea or vomiting Anesthetic complications: no   No notable events documented.   Last Vitals:  Vitals:   07/06/24 1315 07/06/24 1335  BP: (!) 120/58 103/72  Pulse: 64 71  Resp: 17 16  Temp:  (!) 36.4 C  SpO2: 100% 97%    Last Pain:  Vitals:   07/06/24 1335  TempSrc: Temporal  PainSc: 0-No pain                 Debby Mines

## 2024-07-06 NOTE — Interval H&P Note (Signed)
 History and Physical Interval Note:  07/06/2024 11:18 AM  Amber Campos  has presented today for surgery, with the diagnosis of Closed displaced fracture of proximal phalanx of right little finger, initial encounter S62.616A Pain in joints of right hand M25.541.  The various methods of treatment have been discussed with the patient and family. After consideration of risks, benefits and other options for treatment, the patient has consented to  Procedure(s) with comments: CLOSED REDUCTION, FINGER, WITH PERCUTANEOUS PINNING (Right) - Closed reduction percutaneous pinning of right small finger proximal phalanx fracture with application of short arm splint as a surgical intervention.  The patient's history has been reviewed, patient examined, no change in status, stable for surgery.  I have reviewed the patient's chart and labs.  Questions were answered to the patient's satisfaction.     Jackquline GORMAN Barrack

## 2024-08-09 ENCOUNTER — Other Ambulatory Visit: Payer: Self-pay | Admitting: Cardiology

## 2024-08-27 ENCOUNTER — Other Ambulatory Visit: Payer: Self-pay | Admitting: Pediatrics

## 2024-08-27 DIAGNOSIS — Z1231 Encounter for screening mammogram for malignant neoplasm of breast: Secondary | ICD-10-CM

## 2024-08-31 ENCOUNTER — Encounter: Payer: Self-pay | Admitting: Occupational Therapy

## 2024-08-31 ENCOUNTER — Ambulatory Visit: Admitting: Occupational Therapy

## 2024-08-31 DIAGNOSIS — R600 Localized edema: Secondary | ICD-10-CM | POA: Insufficient documentation

## 2024-08-31 DIAGNOSIS — M6281 Muscle weakness (generalized): Secondary | ICD-10-CM | POA: Insufficient documentation

## 2024-08-31 DIAGNOSIS — M79641 Pain in right hand: Secondary | ICD-10-CM | POA: Diagnosis present

## 2024-08-31 DIAGNOSIS — M25641 Stiffness of right hand, not elsewhere classified: Secondary | ICD-10-CM | POA: Diagnosis present

## 2024-08-31 NOTE — Therapy (Signed)
 " OUTPATIENT OCCUPATIONAL THERAPY ORTHO EVALUATION  Patient Name: Amber Campos MRN: [id number] DOB:01/26/1976, 49 y.o., female Today's Date: 08/31/2024  PCP: Dr Adina MART PROVIDER: Dr Ezra  END OF SESSION:  OT End of Session - 08/31/24 1509     Visit Number 1    Number of Visits 15    Date for Recertification  10/26/24    OT Start Time 1210    OT Stop Time 1249    OT Time Calculation (min) 39 min    Activity Tolerance Patient tolerated treatment well    Behavior During Therapy [facility] for tasks assessed/performed          Past Medical History:  Diagnosis Date   Anemia    Anxiety    Chest pain    a. 2010: Low risk stress test.   Chronic constipation    Colon polyp 09/2014   Depression    DOE (dyspnea on exertion)    a. 05/2009: Echo: EF 55-60%.  No rwma. Nl diast parameters.   Dyspareunia    Dysphagia    Family history of adverse reaction to anesthesia    mother and aunt has hard time waking up   GERD (gastroesophageal reflux disease)    History of chickenpox    History of kidney stones    History of recurrent UTI (urinary tract infection)    HLD (hyperlipidemia)    HTN (hypertension)    Hypogammaglobulinemia    Migraine    Perimenopausal atrophic vaginitis    Pneumonia    PSVT (paroxysmal supraventricular tachycardia)    a. 09/2023 Zio: Predominantly sinus rhythm with average of 70 (43-185).  3 SVT runs-fastest 4 beats at 185, longest 21.5 seconds at 108.  Rare PACs and PVCs.  No A-fib/flutter.  No high-grade AV block.  No triggered events.   Sinus bradycardia    a. 2024 - noted on apple watch - metoprolol  reduced.   Urinary frequency    Past Surgical History:  Procedure Laterality Date   ABDOMINAL HYSTERECTOMY  2015   Complete   ABDOMINAL HYSTERECTOMY     APPENDECTOMY     CESAREAN SECTION     CLOSED REDUCTION FINGER WITH PERCUTANEOUS PINNING Right 07/06/2024   Procedure: CLOSED REDUCTION, FINGER, WITH PERCUTANEOUS PINNING;  Surgeon: Ezra Jackquline RAMAN, MD;  Location: [facility] ORS;  Service: Orthopedics;  Laterality: Right;  Closed reduction percutaneous pinning of right small finger proximal phalanx fracture with application of short arm splint   COLONOSCOPY  09/2014   polyp removed   COLONOSCOPY WITH PROPOFOL  N/A 08/21/2022   Procedure: COLONOSCOPY WITH PROPOFOL ;  Surgeon: Therisa Bi, MD;  Location: [facility] ENDOSCOPY;  Service: Gastroenterology;  Laterality: N/A;   ENDOMETRIAL ABLATION     EPIGASTRIC HERNIA REPAIR N/A 09/26/2022   Procedure: HERNIA REPAIR EPIGASTRIC ADULT, open;  Surgeon: Jordis Laneta FALCON, MD;  Location: [facility] ORS;  Service: General;  Laterality: N/A;   ESOPHAGOGASTRODUODENOSCOPY  09/2014   ESOPHAGOGASTRODUODENOSCOPY N/A 08/21/2022   Procedure: ESOPHAGOGASTRODUODENOSCOPY (EGD);  Surgeon: Therisa Bi, MD;  Location: [facility] ENDOSCOPY;  Service: Gastroenterology;  Laterality: N/A;   INSERTION OF MESH N/A 09/26/2022   Procedure: INSERTION OF MESH;  Surgeon: Jordis Laneta FALCON, MD;  Location: [facility] ORS;  Service: General;  Laterality: N/A;   KNEE ARTHROSCOPY Bilateral 2011, 2015   OOPHORECTOMY     TONSILLECTOMY AND ADENOIDECTOMY     Patient Active Problem List   Diagnosis Date Noted   Substance induced mood disorder (HCC) 02/11/2023   Epigastric hernia 09/26/2022  Diarrhea 08/21/2022   Anxiety 08/19/2022   Hyperlipidemia 08/19/2022   Hypertension 08/19/2022   Fatty infiltration of liver 01/31/2019   Duodenitis 01/29/2019   Upper abdominal pain 01/29/2019   Sprain of left ankle 01/06/2018   Plantar fasciitis of left foot 01/06/2018   Hypogammaglobulinemia 03/09/2015   Obesity hypoventilation syndrome (HCC) 11/16/2014   Occipital neuralgia of left side 11/16/2014   Chronic constipation 09/20/2014   Dysphagia 09/20/2014   GERD with esophagitis 09/20/2014   CONTUSION OF BUTTOCK 08/10/2009   Head injury 08/10/2009   CHEST WALL PAIN, ACUTE 08/08/2009   SHORTNESS OF BREATH 06/05/2009   CHEST PAIN 05/15/2009   Right sided abdominal pain  05/09/2009   SINUSITIS- ACUTE-NOS 08/17/2008   Acute upper respiratory infection 06/23/2008   TOBACCO USE 05/30/2008   Depressive disorder, not elsewhere classified 05/30/2008   Allergic rhinitis 05/30/2008   GERD 05/30/2008   Headache 05/30/2008    ONSET DATE: 07/06/25  REFERRING DIAG: s/p CRPP right small finger proximal phalanx   THERAPY DIAG:  Stiffness of right hand, not elsewhere classified  Localized edema  Pain in right hand  Muscle weakness (generalized)  Rationale for Evaluation and Treatment: Rehabilitation  SUBJECTIVE:   SUBJECTIVE STATEMENT: I trying use my hand.  But it hurts by the end of the day.  I cannot hold onto things or grip or pick up or pull her lift-cannot make a fist Pt accompanied by: self  PERTINENT HISTORY: 08/26/24 ortho visit :  Right Upper Extremity: Pin sites are well-healed without signs of infection. She is unable to make a full composite fist with any of her fingers. Her small finger in particular has very limited flexion with only about 20 to 30 degrees of MCP flexion, 30 degrees of PIP flexion, minimal DIP flexion. Minimal swelling about the small finger. The proximal phalanx is nontender to palpation. Sensation intact to light touch. Warm and well-perfused.   Imaging and Results: Radiographs of the right hand were obtained in clinic today and personally interpreted. The fracture continues to show progress in terms of bony union and callus crossing across the fracture site. Reduction is well-maintained.  Assessment & Plan: Amber Campos is a 49 y.o. female patient who is s/p CRPP right small finger proximal phalanx on 07/06/2024 - Activities as tolerated - WBAT RUE - Referral to Occupational Therapy to work on range of motion, edema control, pain control modalities - Continue to work on range of motion and stretching aggressively - Follow-up in 6 weeks for range of motion check; no need for x-rays at that time   PRECAUTIONS: WBAT       WEIGHT BEARING RESTRICTIONS: WBAT  PAIN:  Are you having pain?0/10 pain at rest - increase 5-6/10 with use or ROM  FALLS: Has patient fallen in last 6 months? Yes. Number of falls 1  LIVING ENVIRONMENT: Lives with: Family  PLOF: Had normal range of motion and strength in right hand.  Takes care of all of her grand children and 2 other little ones for 79-year-old and her son with special needs-house activities and cooking  PATIENT GOALS: I want my motion and strength back so that I can use it in all my activities  NEXT MD VISIT: 6 wks last appt   OBJECTIVE:  Note: Objective measures were completed at Evaluation unless otherwise noted.  HAND DOMINANCE: Right  ADLs: Unable to grip any objects and ADLs; doing my hair, cut food, lift pots , brush my teeth, squeeze washcloth - ect  FUNCTIONAL OUTCOME  MEASURES: PRWHE pain and function  UPPER EXTREMITY ROM:     Active ROM Right eval Left eval  Shoulder flexion    Shoulder abduction    Shoulder adduction    Shoulder extension    Shoulder internal rotation    Shoulder external rotation    Elbow flexion    Elbow extension    Wrist flexion 45   Wrist extension 45   Wrist ulnar deviation    Wrist radial deviation    Wrist pronation    Wrist supination    (Blank rows = not tested)  Active ROM Right eval Left eval  Thumb MCP (0-60)    Thumb IP (0-80)    Thumb Radial abd/add (0-55)     Thumb Palmar abd/add (0-45)     Thumb Opposition to Small Finger     Index MCP (0-90) 80    Index PIP (0-100) 80    Index DIP (0-70)      Long MCP (0-90)  75    Long PIP (0-100)  90    Long DIP (0-70)      Ring MCP (0-90)  60    Ring PIP (0-100)  60    Ring DIP (0-70)      Little MCP (0-90)  55    Little PIP (0-100)  35    Little DIP (0-70)      (Blank rows = not tested)    HAND FUNCTION:  Eval Grip strength: Right: 30 lbs; Left: 62 lbs, Lateral pinch: Right: 14 lbs, Left: 17 lbs, and 3 point pinch: Right: 15 lbs, Left:  16 lbs  COORDINATION: Next session  SENSATION: Denies any sensory issues  EDEMA: proximal phalanges 5th 6.5 R , L 5.8; PIP R 6 and L 5.4 ; 4th digit prox 7 cm an L 6.1 and PIP R 7 cm and L 6.2 cm   COGNITION: Overall cognitive status: Within functional limits for tasks assessed     TREATMENT DATE: 08/31/24                                                                                                                            Modalities: Fluidotherapy:  Time: 8 Location: Right wrist and hand Active range of motion for wrist flexion extension as well as digit flexion extension prior to review of home exercises to decrease stiffness increase motion   Patient to do contrast 3 times a day Followed by active assisted range of motion for wrist flexion extension 12 reps Passive range of motion to 4th and 5th DIP PROM to DIP/PIP flexion of 4th and 5th Lumbrical fist  Blocked intrinsic a fist Composite flexion to 3 cm to 4 cm foam block with a placing hold on 4th and 5th 12- 15 reps  Patient fitted with a [employer] glove to use at nighttime as well as some during the day Also fitted with a buddy strap to be used on 4th and 3rd digit 2 hours  And then alternate to 5th and 4th for 2 hours during the day    PATIENT EDUCATION: Education details: findings of eval and HEP  Person educated: Patient Education method: Explanation, Demonstration, Tactile cues, Verbal cues, and Handouts Education comprehension: verbalized understanding, returned demonstration, verbal cues required, and needs further education   GOALS: Goals reviewed with patient? Yes    SHORT TERM GOALS: Target date: 2 wks   Pt to be ind in HEP to decrease edema and stiffness  - increase flexion in 4th and 5th digit  Baseline: no knowledge of HEP - 4th MC and PIP 60 , 5th MC 55 and PIP 35 ; edema increase in 4th and 5th  0.6 to 0.9 cm  Goal status: INITIAL   LONG TERM GOALS: Target date: 8 wks   Independent with  updated strengthening HEP  Baseline: not yet issued addressing edema , and ROM  Goal status: INITIAL  2.  Pt R hand digits flexion increase to touch palm for functional tasks/gripping Baseline:  MC flexion 55 to 80 and PIP's 35 to 90 Goal status: INITIAL  3.  Pt to return to using R  hand as dominant hand for all BADLS (including writing) and light tasks Baseline: Only using 2 and 3 point pinch  Goal status: INITIAL  4.  R Grip strength increase with more than 10-15 lbs to carry groceries, pull door and turn doorknob  Baseline: grip R 30 lbs ,L 62 lbs; only use lat and 2.3 point pinch  Goal status: INITIAL  5.  PRWHE score for function improved with more than 20 points Baseline: PRWHE function score 41/50 Goal status: INITIAL     ASSESSMENT:  CLINICAL IMPRESSION: Patient seen today for occupational therapy evaluation for  s/p CRPP right small finger proximal phalanx -onset was 07/06/2025.  Splints was removed on 08/04/2024.  Patient is right-hand dominant.  Patient presented OT evaluation with decreased range of motion with increased edema especially 4th and 5th digit.  Decreased range of motion decreased strength in grip and prehension strength -also decreased wrist flexion extension.  Limiting her functional use of right dominant hand in ADLs and IADLs.  Patient can benefit from skilled OT services to decrease scar tissue and edema and pain and increase motion and strength to return to prior level of function  PERFORMANCE DEFICITS: in functional skills including ADLs, IADLs, ROM, strength, pain, flexibility, decreased knowledge of use of DME, and UE functional use,   and psychosocial skills including environmental adaptation and routines and behaviors.   IMPAIRMENTS: are limiting patient from ADLs, IADLs, rest and sleep, play, leisure, and social participation.   COMORBIDITIES: has no other co-morbidities that affects occupational performance. Patient will benefit from skilled OT to  address above impairments and improve overall function.  MODIFICATION OR ASSISTANCE TO COMPLETE EVALUATION: No modification of tasks or assist necessary to complete an evaluation.  OT OCCUPATIONAL PROFILE AND HISTORY: Problem focused assessment: Including review of records relating to presenting problem.  CLINICAL DECISION MAKING: LOW - limited treatment options, no task modification necessary  REHAB POTENTIAL: Good for goals  EVALUATION COMPLEXITY: Low   PLAN:  OT FREQUENCY: 1-2x/week  OT DURATION: 8 weeks  PLANNED INTERVENTIONS: 97168 OT Re-evaluation, 97535 self care/ADL training, 02889 therapeutic exercise, 97530 therapeutic activity, 97112 neuromuscular re-education, 97140 manual therapy, 97018 paraffin, 02960 fluidotherapy, 97034 contrast bath, 97760 Orthotic Initial, [id number] Orthotic/Prosthetic subsequent, scar mobilization, passive range of motion, patient/family education, and DME and/or AE instructions    CONSULTED AND AGREED WITH  PLAN OF CARE: Patient     Ancel Peters, OTR/L,CLT 08/31/2024, 6:17 PM   "

## 2024-09-02 ENCOUNTER — Ambulatory Visit

## 2024-09-02 ENCOUNTER — Ambulatory Visit: Admitting: Occupational Therapy

## 2024-09-07 ENCOUNTER — Ambulatory Visit: Admitting: Occupational Therapy

## 2024-09-09 ENCOUNTER — Ambulatory Visit: Admitting: Occupational Therapy

## 2024-09-14 ENCOUNTER — Ambulatory Visit: Admitting: Occupational Therapy

## 2024-09-16 ENCOUNTER — Ambulatory Visit: Admitting: Occupational Therapy

## 2024-09-16 ENCOUNTER — Other Ambulatory Visit: Payer: Self-pay | Admitting: Medical Genetics

## 2024-09-17 ENCOUNTER — Ambulatory Visit: Admitting: Occupational Therapy

## 2024-09-17 DIAGNOSIS — R600 Localized edema: Secondary | ICD-10-CM

## 2024-09-17 DIAGNOSIS — M25641 Stiffness of right hand, not elsewhere classified: Secondary | ICD-10-CM

## 2024-09-17 DIAGNOSIS — M79641 Pain in right hand: Secondary | ICD-10-CM

## 2024-09-17 NOTE — Therapy (Signed)
 " OUTPATIENT OCCUPATIONAL THERAPY ORTHO TREATMENT  Patient Name: Amber Campos MRN: [id number] DOB:Sep 06, 1975, 49 y.o., female Today's Date: 09/17/2024  PCP: Dr Amber Campos PROVIDER: Dr Amber Campos  END OF SESSION:  OT End of Session - 09/17/24 1124     Visit Number 2    Number of Visits 15    Date for Recertification  10/26/24    OT Start Time 1120    OT Stop Time 1200    OT Time Calculation (min) 40 min    Activity Tolerance Patient tolerated treatment well    Behavior During Therapy [facility] for tasks assessed/performed          Past Medical History:  Diagnosis Date   Anemia    Anxiety    Chest pain    a. 2010: Low risk stress test.   Chronic constipation    Colon polyp 09/2014   Depression    DOE (dyspnea on exertion)    a. 05/2009: Echo: EF 55-60%.  No rwma. Nl diast parameters.   Dyspareunia    Dysphagia    Family history of adverse reaction to anesthesia    mother and aunt has hard time waking up   GERD (gastroesophageal reflux disease)    History of chickenpox    History of kidney stones    History of recurrent UTI (urinary tract infection)    HLD (hyperlipidemia)    HTN (hypertension)    Hypogammaglobulinemia    Migraine    Perimenopausal atrophic vaginitis    Pneumonia    PSVT (paroxysmal supraventricular tachycardia)    a. 09/2023 Zio: Predominantly sinus rhythm with average of 70 (43-185).  3 SVT runs-fastest 4 beats at 185, longest 21.5 seconds at 108.  Rare PACs and PVCs.  No A-fib/flutter.  No high-grade AV block.  No triggered events.   Sinus bradycardia    a. 2024 - noted on apple watch - metoprolol  reduced.   Urinary frequency    Past Surgical History:  Procedure Laterality Date   ABDOMINAL HYSTERECTOMY  2015   Complete   ABDOMINAL HYSTERECTOMY     APPENDECTOMY     CESAREAN SECTION     CLOSED REDUCTION FINGER WITH PERCUTANEOUS PINNING Right 07/06/2024   Procedure: CLOSED REDUCTION, FINGER, WITH PERCUTANEOUS PINNING;  Surgeon: Amber Campos Jackquline RAMAN,  MD;  Location: [facility] ORS;  Service: Orthopedics;  Laterality: Right;  Closed reduction percutaneous pinning of right small finger proximal phalanx fracture with application of short arm splint   COLONOSCOPY  09/2014   polyp removed   COLONOSCOPY WITH PROPOFOL  N/A 08/21/2022   Procedure: COLONOSCOPY WITH PROPOFOL ;  Surgeon: Amber Bi, MD;  Location: [facility] ENDOSCOPY;  Service: Gastroenterology;  Laterality: N/A;   ENDOMETRIAL ABLATION     EPIGASTRIC HERNIA REPAIR N/A 09/26/2022   Procedure: HERNIA REPAIR EPIGASTRIC ADULT, open;  Surgeon: Amber Laneta FALCON, MD;  Location: [facility] ORS;  Service: General;  Laterality: N/A;   ESOPHAGOGASTRODUODENOSCOPY  09/2014   ESOPHAGOGASTRODUODENOSCOPY N/A 08/21/2022   Procedure: ESOPHAGOGASTRODUODENOSCOPY (EGD);  Surgeon: Amber Bi, MD;  Location: [facility] ENDOSCOPY;  Service: Gastroenterology;  Laterality: N/A;   INSERTION OF MESH N/A 09/26/2022   Procedure: INSERTION OF MESH;  Surgeon: Amber Laneta FALCON, MD;  Location: [facility] ORS;  Service: General;  Laterality: N/A;   KNEE ARTHROSCOPY Bilateral 2011, 2015   OOPHORECTOMY     TONSILLECTOMY AND ADENOIDECTOMY     Patient Active Problem List   Diagnosis Date Noted   Substance induced mood disorder (HCC) 02/11/2023   Epigastric hernia 09/26/2022  Diarrhea 08/21/2022   Anxiety 08/19/2022   Hyperlipidemia 08/19/2022   Hypertension 08/19/2022   Fatty infiltration of liver 01/31/2019   Duodenitis 01/29/2019   Upper abdominal pain 01/29/2019   Sprain of left ankle 01/06/2018   Plantar fasciitis of left foot 01/06/2018   Hypogammaglobulinemia 03/09/2015   Obesity hypoventilation syndrome (HCC) 11/16/2014   Occipital neuralgia of left side 11/16/2014   Chronic constipation 09/20/2014   Dysphagia 09/20/2014   GERD with esophagitis 09/20/2014   CONTUSION OF BUTTOCK 08/10/2009   Head injury 08/10/2009   CHEST WALL PAIN, ACUTE 08/08/2009   SHORTNESS OF BREATH 06/05/2009   CHEST PAIN 05/15/2009   Right sided abdominal pain  05/09/2009   SINUSITIS- ACUTE-NOS 08/17/2008   Acute upper respiratory infection 06/23/2008   TOBACCO USE 05/30/2008   Depressive disorder, not elsewhere classified 05/30/2008   Allergic rhinitis 05/30/2008   GERD 05/30/2008   Headache 05/30/2008    ONSET DATE: 07/06/25  REFERRING DIAG: s/p CRPP right small finger proximal phalanx   THERAPY DIAG:  Stiffness of right hand, not elsewhere classified  Localized edema  Pain in right hand  Rationale for Evaluation and Treatment: Rehabilitation  SUBJECTIVE:   SUBJECTIVE STATEMENT: I have been working on it but the cold has made it swell up more.  Pt accompanied by: self  PERTINENT HISTORY: 08/26/24 ortho visit :  Right Upper Extremity: Pin sites are well-healed without signs of infection. She is unable to make a full composite fist with any of her fingers. Her small finger in particular has very limited flexion with only about 20 to 30 degrees of MCP flexion, 30 degrees of PIP flexion, minimal DIP flexion. Minimal swelling about the small finger. The proximal phalanx is nontender to palpation. Sensation intact to light touch. Warm and well-perfused.   Imaging and Results: Radiographs of the right hand were obtained in clinic today and personally interpreted. The fracture continues to show progress in terms of bony union and callus crossing across the fracture site. Reduction is well-maintained.  Assessment & Plan: Amber Campos is a 49 y.o. female patient who is s/p CRPP right small finger proximal phalanx on 07/06/2024 - Activities as tolerated - WBAT RUE - Referral to Occupational Therapy to work on range of motion, edema control, pain control modalities - Continue to work on range of motion and stretching aggressively - Follow-up in 6 weeks for range of motion check; no need for x-rays at that time   PRECAUTIONS: WBAT      WEIGHT BEARING RESTRICTIONS: WBAT  PAIN:  Are you having pain? 0/10 pain at rest - increase 5/10  with end range of motion   FALLS: Has patient fallen in last 6 months? Yes. Number of falls 1  LIVING ENVIRONMENT: Lives with: Family  PLOF: Had normal range of motion and strength in right hand.  Takes care of all of her grand children and 2 other little ones for 35-year-old and her son with special needs-house activities and cooking  PATIENT GOALS: I want my motion and strength back so that I can use it in all my activities  NEXT MD VISIT: 6 wks last appt   OBJECTIVE:  Note: Objective measures were completed at Evaluation unless otherwise noted.  HAND DOMINANCE: Right  ADLs: Unable to grip any objects and ADLs; doing my hair, cut food, lift pots , brush my teeth, squeeze washcloth - ect  FUNCTIONAL OUTCOME MEASURES: PRWHE pain and function  UPPER EXTREMITY ROM:     Active ROM Right eval Left  eval R 09/17/24  Shoulder flexion     Shoulder abduction     Shoulder adduction     Shoulder extension     Shoulder internal rotation     Shoulder external rotation     Elbow flexion     Elbow extension     Wrist flexion 45  70  Wrist extension 45  50  Wrist ulnar deviation     Wrist radial deviation     Wrist pronation     Wrist supination     (Blank rows = not tested)  Active ROM Right eval Left eval R 09/17/24  Thumb MCP (0-60)     Thumb IP (0-80)     Thumb Radial abd/add (0-55)      Thumb Palmar abd/add (0-45)      Thumb Opposition to Small Finger      Index MCP (0-90) 80     Index PIP (0-100) 80     Index DIP (0-70)       Long MCP (0-90)  75     Long PIP (0-100)  90     Long DIP (0-70)       Ring MCP (0-90)  60     Ring PIP (0-100)  60     Ring DIP (0-70)       Little MCP (0-90)  55   60  Little PIP (0-100)  35   50 PROM  Little DIP (0-70)       (Blank rows = not tested)    HAND FUNCTION:  Eval Grip strength: Right: 30 lbs; Left: 62 lbs, Lateral pinch: Right: 14 lbs, Left: 17 lbs, and 3 point pinch: Right: 15 lbs, Left: 16 lbs  COORDINATION: Next  session  SENSATION: Denies any sensory issues  EDEMA: proximal phalanges 5th 6.5 R , L 5.8; PIP R 6 and L 5.4 ; 4th digit prox 7 cm an L 6.1 and PIP R 7 cm and L 6.2 cm   COGNITION: Overall cognitive status: Within functional limits for tasks assessed     TREATMENT DATE: 09/17/24                                                                                                                            Modalities: Fluidotherapy:  Time: 8 Location: Right wrist and hand Active range of motion for wrist flexion extension as well as digit flexion extension prior to review of home exercises to decrease stiffness increase motion   Measurements taken after fluido with wrist flexion improved to 70*. Achieves 5th digit opposition.  Added to HEP -  picking 1/2 inch cube up while maintaining opposition to 5th digit and prayer stretch Issued red foam roller for massage to forearm  Plan to trial use of coban wrap to address edema at night time.   Tolerated dry cupping to palm and distal volar forearm with AROM digits and wrist extention 3 x 10 reps.  Tolerated soft tissue and  PROM to 5th digit PIP and MCP   HEP:  Patient to do contrast 3 times a day Followed by active assisted range of motion for wrist flexion extension 12 reps Prayer stretch Passive range of motion to 4th and 5th DIP PROM to DIP/PIP flexion of 4th and 5th Lumbrical fist  Blocked intrinsic a fist Composite flexion to 3 cm to 4 cm foam block with a placing hold on 4th and 5th, 12- 15 reps  New buddy strap issued - continue to use on 4th and 3rd digit 2 hours and then alternate to 5th and 4th for 2 hours during the day    PATIENT EDUCATION: Education details: findings of eval and HEP  Person educated: Patient Education method: Explanation, Demonstration, Tactile cues, Verbal cues, and Handouts Education comprehension: verbalized understanding, returned demonstration, verbal cues required, and needs further education    GOALS: Goals reviewed with patient? Yes    SHORT TERM GOALS: Target date: 2 wks   Pt to be ind in HEP to decrease edema and stiffness  - increase flexion in 4th and 5th digit  Baseline: no knowledge of HEP - 4th MC and PIP 60 , 5th MC 55 and PIP 35 ; edema increase in 4th and 5th  0.6 to 0.9 cm  Goal status: INITIAL   LONG TERM GOALS: Target date: 8 wks   Independent with updated strengthening HEP  Baseline: not yet issued addressing edema , and ROM  Goal status: INITIAL  2.  Pt R hand digits flexion increase to touch palm for functional tasks/gripping Baseline:  MC flexion 55 to 80 and PIP's 35 to 90 Goal status: INITIAL  3.  Pt to return to using R  hand as dominant hand for all BADLS (including writing) and light tasks Baseline: Only using 2 and 3 point pinch  Goal status: INITIAL  4.  R Grip strength increase with more than 10-15 lbs to carry groceries, pull door and turn doorknob  Baseline: grip R 30 lbs ,L 62 lbs; only use lat and 2.3 point pinch  Goal status: INITIAL  5.  PRWHE score for function improved with more than 20 points Baseline: PRWHE function score 41/50 Goal status: INITIAL     ASSESSMENT:  CLINICAL IMPRESSION: Patient seen today for occupational therapy evaluation for  s/p CRPP right small finger proximal phalanx -onset was 07/06/2025.  Splints was removed on 08/04/2024.  Patient is right-hand dominant.  Pt arrives after missing 2 weeks due to snow and childcare issues. Reports having lost buddy strap, new one issued. Measurements taken after fluido with wrist flexion improved to 70* and achieves 5th digit opposition. Updated HEP to prayer stretch and picking small cube up with 5th digit opposition. Continues to demonstrate decreased range of motion decreased strength in grip and prehension strength -also decreased wrist flexion extension. All limiting her functional use of right dominant hand in ADLs and IADLs.  Patient can benefit from skilled OT  services to decrease scar tissue and edema and pain and increase motion and strength to return to prior level of function  PERFORMANCE DEFICITS: in functional skills including ADLs, IADLs, ROM, strength, pain, flexibility, decreased knowledge of use of DME, and UE functional use,   and psychosocial skills including environmental adaptation and routines and behaviors.   IMPAIRMENTS: are limiting patient from ADLs, IADLs, rest and sleep, play, leisure, and social participation.   COMORBIDITIES: has no other co-morbidities that affects occupational performance. Patient will benefit from skilled OT to address above impairments  and improve overall function.  MODIFICATION OR ASSISTANCE TO COMPLETE EVALUATION: No modification of tasks or assist necessary to complete an evaluation.  OT OCCUPATIONAL PROFILE AND HISTORY: Problem focused assessment: Including review of records relating to presenting problem.  CLINICAL DECISION MAKING: LOW - limited treatment options, no task modification necessary  REHAB POTENTIAL: Good for goals  EVALUATION COMPLEXITY: Low   PLAN:  OT FREQUENCY: 1-2x/week  OT DURATION: 8 weeks  PLANNED INTERVENTIONS: 97168 OT Re-evaluation, 97535 self care/ADL training, 02889 therapeutic exercise, 97530 therapeutic activity, 97112 neuromuscular re-education, 97140 manual therapy, 97018 paraffin, 02960 fluidotherapy, 97034 contrast bath, 97760 Orthotic Initial, [id number] Orthotic/Prosthetic subsequent, scar mobilization, passive range of motion, patient/family education, and DME and/or AE instructions    CONSULTED AND AGREED WITH PLAN OF CARE: Patient     Elston JINNY Slot, OTR/L 09/17/2024, 12:16 PM   "

## 2024-09-21 ENCOUNTER — Ambulatory Visit: Admitting: Occupational Therapy

## 2024-09-23 ENCOUNTER — Ambulatory Visit
